# Patient Record
Sex: Male | Born: 1950
Health system: Southern US, Community
[De-identification: ages and names within clinical notes are randomized; demographics above are authoritative.]

## PROBLEM LIST (undated history)

## (undated) DIAGNOSIS — N400 Enlarged prostate without lower urinary tract symptoms: Secondary | ICD-10-CM

## (undated) DIAGNOSIS — K5732 Diverticulitis of large intestine without perforation or abscess without bleeding: Secondary | ICD-10-CM

## (undated) DIAGNOSIS — R001 Bradycardia, unspecified: Secondary | ICD-10-CM

## (undated) DIAGNOSIS — N200 Calculus of kidney: Secondary | ICD-10-CM

## (undated) DIAGNOSIS — R55 Syncope and collapse: Secondary | ICD-10-CM

## (undated) DIAGNOSIS — I10 Essential (primary) hypertension: Secondary | ICD-10-CM

## (undated) HISTORY — DX: Calculus of kidney: N20.0

## (undated) HISTORY — DX: Diverticulitis of large intestine without perforation or abscess without bleeding: K57.32

## (undated) HISTORY — DX: Essential (primary) hypertension: I10

## (undated) HISTORY — PX: TRANSURETHRAL RESECTION OF PROSTATE: SHX73

## (undated) HISTORY — PX: CYSTOSTOMY W/ BLADDER BIOPSY: SHX1431

## (undated) HISTORY — DX: Benign prostatic hyperplasia without lower urinary tract symptoms: N40.0

## (undated) HISTORY — DX: Bradycardia, unspecified: R00.1

## (undated) HISTORY — DX: Syncope and collapse: R55

---
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
## 1998-10-03 ENCOUNTER — Other Ambulatory Visit: Admission: RE | Admit: 1998-10-03 | Discharge: 1998-10-03 | Payer: Self-pay | Admitting: Urology

## 1999-03-07 ENCOUNTER — Emergency Department (HOSPITAL_COMMUNITY): Admission: EM | Admit: 1999-03-07 | Discharge: 1999-03-07 | Payer: Self-pay

## 1999-11-16 ENCOUNTER — Encounter (INDEPENDENT_AMBULATORY_CARE_PROVIDER_SITE_OTHER): Payer: Self-pay | Admitting: Specialist

## 1999-11-16 ENCOUNTER — Ambulatory Visit (HOSPITAL_COMMUNITY): Admission: RE | Admit: 1999-11-16 | Discharge: 1999-11-16 | Payer: Self-pay | Admitting: Urology

## 2005-05-04 ENCOUNTER — Ambulatory Visit: Payer: Self-pay | Admitting: Internal Medicine

## 2005-09-25 ENCOUNTER — Ambulatory Visit: Payer: Self-pay | Admitting: Internal Medicine

## 2006-01-21 ENCOUNTER — Ambulatory Visit: Payer: Self-pay | Admitting: Internal Medicine

## 2006-05-08 ENCOUNTER — Ambulatory Visit: Payer: Self-pay | Admitting: Internal Medicine

## 2006-05-08 LAB — CONVERTED CEMR LAB
Basophils Relative: 0.5 % (ref 0.0–1.0)
CO2: 34 meq/L — ABNORMAL HIGH (ref 19–32)
Calcium: 9.5 mg/dL (ref 8.4–10.5)
Eosinophils Relative: 2.1 % (ref 0.0–5.0)
GFR calc Af Amer: 89 mL/min
Glucose, Bld: 106 mg/dL — ABNORMAL HIGH (ref 70–99)
HCT: 48.1 % (ref 39.0–52.0)
Hemoglobin: 16.4 g/dL (ref 13.0–17.0)
Lymphocytes Relative: 19 % (ref 12.0–46.0)
Monocytes Absolute: 0.7 10*3/uL (ref 0.2–0.7)
Neutro Abs: 4.8 10*3/uL (ref 1.4–7.7)
Neutrophils Relative %: 68.3 % (ref 43.0–77.0)
Platelets: 335 10*3/uL (ref 150–400)
VLDL: 14 mg/dL (ref 0–40)
WBC: 6.9 10*3/uL (ref 4.5–10.5)

## 2006-05-30 ENCOUNTER — Ambulatory Visit: Payer: Self-pay | Admitting: Cardiology

## 2006-05-30 ENCOUNTER — Ambulatory Visit: Payer: Self-pay

## 2006-06-25 ENCOUNTER — Ambulatory Visit: Payer: Self-pay | Admitting: Internal Medicine

## 2007-06-13 ENCOUNTER — Telehealth (INDEPENDENT_AMBULATORY_CARE_PROVIDER_SITE_OTHER): Payer: Self-pay | Admitting: *Deleted

## 2007-07-24 ENCOUNTER — Ambulatory Visit: Payer: Self-pay | Admitting: Internal Medicine

## 2007-07-24 DIAGNOSIS — F988 Other specified behavioral and emotional disorders with onset usually occurring in childhood and adolescence: Secondary | ICD-10-CM | POA: Insufficient documentation

## 2007-07-24 DIAGNOSIS — K5790 Diverticulosis of intestine, part unspecified, without perforation or abscess without bleeding: Secondary | ICD-10-CM | POA: Insufficient documentation

## 2007-07-24 DIAGNOSIS — N400 Enlarged prostate without lower urinary tract symptoms: Secondary | ICD-10-CM | POA: Insufficient documentation

## 2007-07-24 DIAGNOSIS — I10 Essential (primary) hypertension: Secondary | ICD-10-CM | POA: Insufficient documentation

## 2007-07-25 LAB — CONVERTED CEMR LAB
AST: 20 units/L (ref 0–37)
Alkaline Phosphatase: 59 units/L (ref 39–117)
Bilirubin Urine: NEGATIVE
Bilirubin, Direct: 0.2 mg/dL (ref 0.0–0.3)
Chloride: 105 meq/L (ref 96–112)
Eosinophils Absolute: 0.1 10*3/uL (ref 0.0–0.7)
Eosinophils Relative: 2.2 % (ref 0.0–5.0)
GFR calc non Af Amer: 66 mL/min
HCT: 45 % (ref 39.0–52.0)
HDL: 46.8 mg/dL (ref >39.0)
Hemoglobin, Urine: NEGATIVE
Leukocytes, UA: NEGATIVE
MCV: 87.2 fL (ref 78.0–100.0)
Monocytes Absolute: 0.5 10*3/uL (ref 0.1–1.0)
Neutro Abs: 4 10*3/uL (ref 1.4–7.7)
Nitrite: NEGATIVE
Platelets: 331 10*3/uL (ref 150–400)
Potassium: 3.9 meq/L (ref 3.5–5.1)
RBC / HPF: NONE SEEN
RDW: 12.8 % (ref 11.5–14.6)
Total Bilirubin: 0.9 mg/dL (ref 0.3–1.2)
Triglycerides: 87 mg/dL (ref 0–149)
Urine Glucose: NEGATIVE mg/dL
WBC: 5.7 10*3/uL (ref 4.5–10.5)

## 2007-08-20 ENCOUNTER — Encounter: Payer: Self-pay | Admitting: Internal Medicine

## 2008-07-05 ENCOUNTER — Ambulatory Visit: Payer: Self-pay | Admitting: Internal Medicine

## 2008-07-05 ENCOUNTER — Inpatient Hospital Stay (HOSPITAL_COMMUNITY): Admission: EM | Admit: 2008-07-05 | Discharge: 2008-07-07 | Payer: Self-pay | Admitting: Emergency Medicine

## 2008-07-06 ENCOUNTER — Encounter: Payer: Self-pay | Admitting: Cardiology

## 2008-07-06 HISTORY — PX: US ECHOCARDIOGRAPHY: HXRAD669

## 2008-07-07 ENCOUNTER — Ambulatory Visit: Payer: Self-pay | Admitting: Internal Medicine

## 2008-07-21 DIAGNOSIS — I498 Other specified cardiac arrhythmias: Secondary | ICD-10-CM | POA: Insufficient documentation

## 2008-07-21 DIAGNOSIS — R55 Syncope and collapse: Secondary | ICD-10-CM | POA: Insufficient documentation

## 2008-07-23 ENCOUNTER — Ambulatory Visit: Payer: Self-pay | Admitting: Cardiology

## 2008-07-23 ENCOUNTER — Ambulatory Visit: Payer: Self-pay

## 2008-07-26 ENCOUNTER — Ambulatory Visit: Payer: Self-pay | Admitting: Internal Medicine

## 2008-11-01 ENCOUNTER — Telehealth (INDEPENDENT_AMBULATORY_CARE_PROVIDER_SITE_OTHER): Payer: Self-pay | Admitting: *Deleted

## 2008-11-10 ENCOUNTER — Ambulatory Visit: Payer: Self-pay | Admitting: Internal Medicine

## 2008-11-11 LAB — CONVERTED CEMR LAB
Alkaline Phosphatase: 65 units/L (ref 39–117)
Basophils Absolute: 0 10*3/uL (ref 0.0–0.1)
Basophils Relative: 0.4 % (ref 0.0–3.0)
Bilirubin, Direct: 0.2 mg/dL (ref 0.0–0.3)
CO2: 32 meq/L (ref 19–32)
Calcium: 9.7 mg/dL (ref 8.4–10.5)
Creatinine, Ser: 1.4 mg/dL (ref 0.4–1.5)
Eosinophils Absolute: 0.2 10*3/uL (ref 0.0–0.7)
HDL: 53.4 mg/dL (ref >39.00)
LDL Cholesterol: 116 mg/dL — ABNORMAL HIGH (ref 0–99)
Lymphocytes Relative: 23.7 % (ref 12.0–46.0)
MCHC: 34.4 g/dL (ref 30.0–36.0)
Neutrophils Relative %: 62.5 % (ref 43.0–77.0)
Nitrite: NEGATIVE
RBC: 5.29 M/uL (ref 4.22–5.81)
Total CHOL/HDL Ratio: 3
Total Protein, Urine: NEGATIVE mg/dL
Total Protein: 7.3 g/dL (ref 6.0–8.3)
Triglycerides: 71 mg/dL (ref 0.0–149.0)
VLDL: 14.2 mg/dL (ref 0.0–40.0)
WBC: 6.7 10*3/uL (ref 4.5–10.5)
pH: 5.5 (ref 5.0–8.0)

## 2008-12-01 ENCOUNTER — Ambulatory Visit: Payer: Self-pay | Admitting: Internal Medicine

## 2009-05-25 ENCOUNTER — Encounter: Payer: Self-pay | Admitting: Internal Medicine

## 2009-06-03 ENCOUNTER — Ambulatory Visit: Payer: Self-pay | Admitting: Internal Medicine

## 2009-06-03 DIAGNOSIS — J069 Acute upper respiratory infection, unspecified: Secondary | ICD-10-CM | POA: Insufficient documentation

## 2010-01-16 ENCOUNTER — Encounter: Payer: Self-pay | Admitting: Internal Medicine

## 2010-01-16 ENCOUNTER — Ambulatory Visit: Payer: Self-pay | Admitting: Internal Medicine

## 2010-01-26 ENCOUNTER — Encounter: Payer: Self-pay | Admitting: Internal Medicine

## 2010-01-26 ENCOUNTER — Telehealth (INDEPENDENT_AMBULATORY_CARE_PROVIDER_SITE_OTHER): Payer: Self-pay | Admitting: *Deleted

## 2010-02-06 ENCOUNTER — Telehealth: Payer: Self-pay | Admitting: Internal Medicine

## 2010-02-28 ENCOUNTER — Telehealth (INDEPENDENT_AMBULATORY_CARE_PROVIDER_SITE_OTHER): Payer: Self-pay | Admitting: *Deleted

## 2010-03-01 ENCOUNTER — Ambulatory Visit: Payer: Self-pay | Admitting: Internal Medicine

## 2010-03-02 ENCOUNTER — Encounter: Payer: Self-pay | Admitting: Physician Assistant

## 2010-03-02 ENCOUNTER — Encounter (INDEPENDENT_AMBULATORY_CARE_PROVIDER_SITE_OTHER): Payer: Self-pay | Admitting: Internal Medicine

## 2010-03-02 ENCOUNTER — Telehealth: Payer: Self-pay | Admitting: Internal Medicine

## 2010-03-02 ENCOUNTER — Observation Stay (HOSPITAL_COMMUNITY)
Admission: EM | Admit: 2010-03-02 | Discharge: 2010-03-03 | Payer: Self-pay | Source: Home / Self Care | Attending: Internal Medicine | Admitting: Internal Medicine

## 2010-03-03 ENCOUNTER — Telehealth (INDEPENDENT_AMBULATORY_CARE_PROVIDER_SITE_OTHER): Payer: Self-pay | Admitting: *Deleted

## 2010-03-03 ENCOUNTER — Encounter: Payer: Self-pay | Admitting: Physician Assistant

## 2010-03-07 ENCOUNTER — Telehealth: Payer: Self-pay | Admitting: Internal Medicine

## 2010-03-10 ENCOUNTER — Ambulatory Visit: Payer: Self-pay | Admitting: Internal Medicine

## 2010-03-17 ENCOUNTER — Ambulatory Visit: Payer: Self-pay | Admitting: Physician Assistant

## 2010-03-17 ENCOUNTER — Encounter: Payer: Self-pay | Admitting: Physician Assistant

## 2010-03-17 ENCOUNTER — Encounter: Payer: Self-pay | Admitting: Cardiology

## 2010-03-17 DIAGNOSIS — I4891 Unspecified atrial fibrillation: Secondary | ICD-10-CM | POA: Insufficient documentation

## 2010-03-28 ENCOUNTER — Telehealth: Payer: Self-pay | Admitting: Cardiology

## 2010-03-28 LAB — CONVERTED CEMR LAB: TSH: 0.588 microintl units/mL (ref 0.350–4.500)

## 2010-04-13 ENCOUNTER — Ambulatory Visit (HOSPITAL_COMMUNITY): Admission: RE | Admit: 2010-04-13 | Payer: Self-pay | Source: Home / Self Care | Admitting: Internal Medicine

## 2010-04-20 ENCOUNTER — Ambulatory Visit: Admission: RE | Admit: 2010-04-20 | Discharge: 2010-04-20 | Payer: Self-pay | Source: Home / Self Care

## 2010-04-20 ENCOUNTER — Ambulatory Visit
Admission: RE | Admit: 2010-04-20 | Discharge: 2010-04-20 | Payer: Self-pay | Source: Home / Self Care | Attending: Internal Medicine | Admitting: Internal Medicine

## 2010-04-20 ENCOUNTER — Ambulatory Visit (HOSPITAL_COMMUNITY)
Admission: RE | Admit: 2010-04-20 | Discharge: 2010-04-20 | Payer: Self-pay | Source: Home / Self Care | Attending: Cardiology | Admitting: Cardiology

## 2010-04-20 DIAGNOSIS — I498 Other specified cardiac arrhythmias: Secondary | ICD-10-CM

## 2010-04-21 ENCOUNTER — Encounter: Payer: Self-pay | Admitting: Physician Assistant

## 2010-04-25 NOTE — Progress Notes (Signed)
Summary: fainting spell pt at [facility], [place]  Phone Note Call from Patient   Caller: Patient [phone] Call For: Auden Tatar Summary of Call: pt is in [place]  had a fainting spell Initial call taken by: Lacinda Axon,  March 02, 2010 1:50 PM  Follow-up for Phone Call        pt states this morning when he got up he fainted and was taken to [facility] to be evaluated and they are keeping him over night for evaluation.  Pt states yesterday he took norvasc in the morning and took the cardura last night. He did not take flomax at all yesterday.  He thinks the fainting spell might be due to the new meds. Pt just wanted MW to be aware of what happened. Please advise. Carron Curie CMA  March 02, 2010 2:53 PM Aware, discussed with hospitalist, the dose of cardura will need to be adjusted until the effect of the norvasc has completely worn off.  Follow-up by: Nyoka Cowden MD,  March 03, 2010 8:00 AM

## 2010-04-25 NOTE — Progress Notes (Signed)
Summary: HFU w/ wert > 12.16.11 @ 1155  Phone Note Call from Patient   Caller: Patient Call For: wert Summary of Call: pt being d/c'd from hosp this morning. he wants to be see for a HFU w/ dr wert asap next wk. he requested today but since dr wert isn't in office today pt req asap next wk. pls advise. cell [phone] Initial call taken by: Tivis Ringer, CNA,  March 03, 2010 9:28 AM  Follow-up for Phone Call        called spoke with patient who is being dc'd from [facility] today.  would like post hosp f/u sometime next week.  hfu scheduled w/ MW 12.16.11 @ 1155.  pt okay with this date and time. Follow-up by: Boone Master CNA/MA,  March 03, 2010 10:00 AM

## 2010-04-25 NOTE — Assessment & Plan Note (Signed)
Summary: Primary svc/ f/u hbp bph > change to cardura   Primary Provider/Referring Provider:  Sherene Sires  CC:  Elavated BP x 2 days.  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    06/2008 One episode syncope eval by Dr Rosette Reveal  > reduce toprol by half and leave off dexadrin  November 10, 2008 cpx no aerobics but no limitations and no presyncope. no change in rx.  June 04, 2009 6 month followup.  Pt c/o head congestion x 3-4 days.  He also c/o dry cough.  no tia or claudication.   January 16, 2010 cpx no c/o tia, claudication/sob. rec stop maxzide unless blood pressure is up or swelling  March 01, 2010 ov f/u hbp somewhat  eratic with stress at office.  no problem with urinary flow on flomax. some leg swelling on norvasc so restarted maxzide as per instructions but bp still up over  100 diastolic esp p work.  no ha, cp, tia or claudication symptoms.  no sob. leg swelling better  Current Medications (verified): 1)  Tenex 1 Mg  Tabs (Guanfacine Hcl) .[name].. 1 By Mouth At Bedtime 2)  Toprol Xl 25 Mg Xr24h-Tab (Metoprolol Succinate) .... Take 1 Tablet By Mouth Once A Day 3)  Ecotrin 325 Mg  Tbec (Aspirin) .[name].. 1 By Mouth Once Daily 4)  Flomax 0.4 Mg  Cp24 (Tamsulosin Hcl) .[name].. 1 By Mouth At Bedtime 5)  Maxzide-25 37.5-25 Mg  Tabs (Triamterene-Hctz) .[name].. 1 By Mouth Once Daily If Sys Bp Over 130 or Dias Over 85 or Leg Swelling 6)  Finasteride 5 Mg  Tabs (Finasteride) .[name].. 1 By Mouth Once Daily 7)  Amlodipine Besylate 5 Mg  Tabs (Amlodipine Besylate) .... One Tablet By Mouth Daily 8)  Coricidin Hbp Cold/flu 2-325 Mg Tabs (Chlorpheniramine-Apap) .... As Directed 9)  Nyquil 60-7.08-22-998 Mg/22ml Liqd (Pseudoeph-Doxylamine-Dm-Apap) .... Per Bottle Directions As Needed  Allergies (verified): 1)  ! Amoxicillin  Past History:  Past Medical History: HYPERTENSION  (ICD-401.9) [name] SYNCOPE (ICD-780.2) DIVERTICULITIS OF COLON (ICD-562.11)..........................................................Marland KitchenLina Sar      - colonoscopy 07/11/2000 Health Maintenance...............................................................................................Marland KitchenWert     - DT November 10, 2008      - CPX January 16, 2010  NEPHROLITHIASIS/BPH......................................................................................[name] Dr. [name]     -  prostatitis with noncaseating granulomatous inflammation July 2000 negative IPPD    - Change flomax to cardura for bp March 01, 2010  ADULT ADD.........................................................................................................Marland KitchenDr  [name]  Vital Signs:  Patient profile:   60 year old male Weight:      192 pounds O2 Sat:      98 % on Room air Temp:     97.4 degrees F oral Pulse rate:   75 / minute BP sitting:   126 / 88  (right arm)  Vitals Entered By: Vernie Murders (March 01, 2010 10:34 AM)  O2 Flow:  Room air  Physical Exam  Additional Exam:  Ambulatory healthy appearing in no acute distress.  wt   207 November 10, 2008 > 205 June 03, 2009 >  187  January 16, 2010 > 192 March 01, 2010  HEENT: nl dentition, turbinates, and orophanx. Nl external ear canals without cough reflex Neck without JVD/Nodes/TM normal carotids without bruits Lungs clear to A and P bilaterally without cough on insp or exp maneuvers RRR no s3 or murmur or increase in P2 no  displacement PMI Abd soft and benign with nl excursion in the supine position. No bruits or organomegaly Ext warm without calf tenderness, cyanosis clubbing or edema symmetric pedal pulses      Impression & Recommendations:  Problem # 1:  HYPERTENSION (ICD-401.9)  The following medications were removed from the medication list:    Amlodipine Besylate 5 Mg Tabs (Amlodipine besylate) ..... One tablet by mouth daily His  updated medication list for this problem includes:    Tenex 1 Mg Tabs (Guanfacine hcl) .[name]... 1 by mouth at bedtime    Toprol Xl 25 Mg Xr24h-tab (Metoprolol succinate) .[name]... Take 1 tablet by mouth once a day    Maxzide-25 37.5-25 Mg Tabs (Triamterene-hctz) .[name]... 1 by mouth once daily if sys bp over 130 or dias over 85 or leg swelling    Cardura 8 Mg Tabs (Doxazosin mesylate) ..... One twice daily  Orders: Est. Patient Level IV (04540)  Problem # 2:  BENIGN PROSTATIC HYPERTROPHY, HX OF (ICD-V13.8)  Cardura in high doses should be able to replace the flomax and the norvasc (which tends to cause swelling and not helping urinary flow like cardura will)   Orders: Est. Patient Level IV (98119)  Medications Added to Medication List This Visit: 1)  Cardura 8 Mg Tabs (Doxazosin mesylate) .... One twice daily  Patient Instructions: 1)  Try off flomax and norvasc 2)  Cardura 8 mg one twice daily and take one half twice daily if too strong 3)  Add a second dose Toprol 25 mg in evening if blood pressure too high  4)  Return to office in 3 months, sooner if needed  Prescriptions: CARDURA 8 MG TABS (DOXAZOSIN MESYLATE) one twice daily  #60 x 11   Entered and Authorized by:   Nyoka Cowden MD   Signed by:   Nyoka Cowden MD on 03/01/2010   Method used:   Electronically to        [facility]. [phone]* (retail)       [address]       [place], [place]  [address]       Ph: [phone]       Fax: [phone]   RxID:   [id number]

## 2010-04-25 NOTE — Letter (Signed)
Summary: [facility]  [address]   [place], [place] [address]   Phone: [phone]  Fax: [phone]    01/26/2010  [name] [address] [place], [place]  [address]  To whom it may concern,    The above named patient had an appointment here on 01/16/10 for his annual comprehensive healthcare evaluation which included a routine chest x-ray, ECG, and bloodwork.          Sincerely,     Sandrea Hughs, MD

## 2010-04-25 NOTE — Assessment & Plan Note (Signed)
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    06/2008 One episode syncope eval by Dr Rosette Reveal  > reduce toprol by half and leave off dexadrin  November 10, 2008 cpx no aerobics but no limitations and no presyncope. no change in rx.  June 04, 2009 6 month followup.  Pt c/o head congestion x 3-4 days.  He also c/o dry cough.  no tia or claudication.   January 16, 2010 cpx no c/o tia, claudication/sob. Pt denies any significant sore throat, dysphagia, itching, sneezing,  nasal congestion or excess secretions,  fever, chills, sweats, unintended wt loss, pleuritic or exertional cp, hempoptysis, change in activity tolerance  orthopnea pnd or leg swelling   Current Medications (verified): 1)  Tenex 1 Mg  Tabs (Guanfacine Hcl) .[name].. 1 By Mouth At Bedtime 2)  Toprol Xl 25 Mg Xr24h-Tab (Metoprolol Succinate) .... Take 1 Tablet By Mouth Once A Day 3)  Ecotrin 325 Mg  Tbec (Aspirin) .[name].. 1 By Mouth Once Daily 4)  Flomax 0.4 Mg  Cp24 (Tamsulosin Hcl) .[name].. 1 By Mouth At Bedtime 5)  Maxzide-25 37.5-25 Mg  Tabs (Triamterene-Hctz) .[name].. 1 By Mouth Once Daily 6)  Finasteride 5 Mg  Tabs (Finasteride) .[name].. 1 By Mouth Once Daily 7)  Amlodipine Besylate 5 Mg  Tabs (Amlodipine Besylate) .... One Tablet By Mouth Daily 8)  Coricidin Hbp Cold/flu 2-325 Mg Tabs (Chlorpheniramine-Apap) .... As Directed 9)  Nyquil 60-7.08-22-998 Mg/10ml Liqd (Pseudoeph-Doxylamine-Dm-Apap) .... Per Bottle Directions As Needed  Allergies (verified): 1)  ! Amoxicillin  Past History:  Past Medical History: HYPERTENSION (ICD-401.9) [name] SYNCOPE (ICD-780.2) DIVERTICULITIS OF COLON (ICD-562.11)..........................................................Marland KitchenLina Sar      - colonoscopy 07/11/2000 Health  Maintenance...............................................................................................Marland KitchenWert     - DT November 10, 2008      - CPX January 16, 2010  NEPHROLITHIASIS/BPH......................................................................................[name] Dr. [name]     -  prostatitis with noncaseating granulomatous inflammation July 2000 negative IPPD ADULT ADD.........................................................................................................Marland KitchenDr  [name]  Family History: HBP/ kidney failure father IHD  with cabg at 15 mother one younger sister healthy no problems  Social History:  The patient lives at home with his wife.  He is a   Clinical research associate.  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  He drinks 3   glasses of wine per  week.   never smoker  Vital Signs:  Patient profile:   60 year old male Weight:      187.38 pounds BMI:     22.89 O2 Sat:      98 % on Room air Temp:     97.5 degrees F oral Pulse rate:   72 / minute BP sitting:   108 / 80  (left arm)  Vitals Entered By: Vernie Murders (January 16, 2010 8:43 AM)  O2 Flow:  Room air  Physical Exam  Additional Exam:  Ambulatory healthy appearing in no acute distress.  wt 208 > 207 November 10, 2008 > 205 June 03, 2009 >  187  January 16, 2010  HEENT: nl dentition, turbinates, and orophanx. Nl external ear canals without cough reflex Neck without JVD/Nodes/TM normal carotids without bruits Lungs clear to A and P bilaterally without cough on insp or exp maneuvers RRR no s3 or  murmur or increase in P2 no displacement PMI Abd soft and benign with nl excursion in the supine position. No bruits or organomegaly Ext warm without calf tenderness, cyanosis clubbing or edema symmetric pedal pulses Skin warm and dry without lesions  except for slight asymmetric freckles lower trunk    CXR  Procedure date:  01/16/2010  Findings:        Comparison:  Portable chest x-ray of 07/06/2008   Findings: The lungs are clear.  A tiny granuloma in the right mid lung appears stable.  Mediastinal contours are stable.  The heart is within normal limits in size.  No bony abnormality is seen.   IMPRESSION: Stable chest x-ray.  No active lung disease.  Impression & Recommendations:  Problem # 1:  HYPERTENSION (ICD-401.9)  His updated medication list for this problem includes:    Tenex 1 Mg Tabs (Guanfacine hcl) .[name]... 1 by mouth at bedtime    Toprol Xl 25 Mg Xr24h-tab (Metoprolol succinate) .[name]... Take 1 tablet by mouth once a day    Maxzide-25 37.5-25 Mg Tabs (Triamterene-hctz) .[name]... 1 by mouth once daily if sys bp over 130 or dias over 85 or leg swelling    Amlodipine Besylate 5 Mg Tabs (Amlodipine besylate) ..... One tablet by mouth daily  Problem # 2:  [name]  His updated medication list for this problem includes:    Toprol Xl 25 Mg Xr24h-tab (Metoprolol succinate) .[name]... Take 1 tablet by mouth once a day    Ecotrin 325 Mg Tbec (Aspirin) .[name]... 1 by mouth once daily  tolerating low dose toprol  Problem # 3:  BENIGN PROSTATIC HYPERTROPHY, HX OF (ICD-V13.8) f/u per urology planned  Medications Added to Medication List This Visit: 1)  Maxzide-25 37.5-25 Mg Tabs (Triamterene-hctz) .[name].. 1 by mouth once daily if sys bp over 130 or dias over 85 or leg swelling  Other Orders: EKG w/ Interpretation (93000) TLB-Lipid Panel (80061-LIPID) TLB-BMP (Basic Metabolic Panel-BMET) (80048-METABOL) TLB-CBC Platelet - w/Differential (85025-CBCD) TLB-Hepatic/Liver Function Pnl (80076-HEPATIC) TLB-TSH (Thyroid Stimulating Hormone) (84443-TSH) TLB-CRP-High Sensitivity (C-Reactive Protein) (86140-FCRP) TLB-Udip ONLY (81003-UDIP) T-2 View CXR (71020TC) Est. Patient 40-64 years (40981)  Patient Instructions: 1)  Call [phone] for your results w/in next 3 days - if there's something important  I feel you need to know,  I'll be in touch with  you directly.  2)  stop maxzide unless blood pressure is up or swelling

## 2010-04-25 NOTE — Progress Notes (Signed)
Summary: Labs reviewed from [facility]  Phone Note Call from Patient   Caller: Patient Call For: wert Summary of Call: pt states he has not received letter mailed on 11/3 (for his ins/ re: recent physical). pls fax this to pt at [phone]. pt ph# is [phone] Initial call taken by: Tivis Ringer, CNA,  February 06, 2010 11:26 AM  Follow-up for Phone Call        Spoke with pt. He is requesting letter for insurance co to be faxed to him at [phone].  States he never received the one that was mailed.  Letter faxed -- pt aware.    Pt also states he had some lab work done at [facility] after his OV  on 01/16/10. Requesting results.  No record of these results in EMR yet -- Dr. Sherene Sires, do you recall seeing these results? Follow-up by: Gweneth Dimitri RN,  February 06, 2010 12:20 PM  Additional Follow-up for Phone Call Additional follow up Details #1::        I have not seen them - check with [facility] Additional Follow-up by: Nyoka Cowden MD,  February 06, 2010 12:45 PM    Additional Follow-up for Phone Call Additional follow up Details #2::    Need to know which [facility] we went to? There are several. LMOMTCB Vernie Murders  February 06, 2010 4:05 PM   Spoke with pt.  He states that he went to have labs at the [facility] on [address] in [place]- there number is [phone].  I called and msg states that they are closed for lunch so will try calling back this pm. Vernie Murders  February 07, 2010 12:15 PM   Additional Follow-up for Phone Call Additional follow up Details #3:: Details for Additional Follow-up Action Taken: Called and spoke with June at [facility] and am awaiting results to be sent to the triage faxline-  Vernie Murders  February 07, 2010 2:06 PM  labs placed in MW lookat Vernie Murders  February 07, 2010 4:30 PM Reviewed and all are wnl, no changes needed.  LDL chol is 98 vs 116 a year ago.   Additional Follow-up by: Nyoka Cowden MD,  February 08, 2010 8:46 AM  LMTCBx1 to advised Labs  normal.Jennifer Yancey Flemings CMA  February 08, 2010 10:33 AM  pt aware of lab results. Carron Curie CMA  February 08, 2010 11:39 AM

## 2010-04-25 NOTE — Progress Notes (Signed)
Summary: High Blood Pressure   Phone Note Call from Patient Call back at [phone] or [phone](cell)   Caller: Patient Call For: wert Summary of Call: Pt called c/o high blood pressure readings 145/100 today(checked both arms). States this is unusal for him.  Has not skipped any doses on meds. Please advise. Initial call taken by: Reynaldo Minium CMA,  February 28, 2010 2:36 PM  Follow-up for Phone Call        pt requested ov with mw--pt scheduled to see mw wed. at 10:20 Follow-up by: Philipp Deputy CMA,  February 28, 2010 3:43 PM

## 2010-04-25 NOTE — Progress Notes (Signed)
Summary: note  Phone Note Call from Patient Call back at [phone](w) [phone](c)   Caller: Patient Call For: wert Summary of Call: Pt states he needs a not for his insurance co stating he had a cpx from MW. Initial call taken by: Darletta Moll,  January 26, 2010 4:18 PM  Follow-up for Phone Call        Spoke with pt, created letter and mailed to him per his request. Follow-up by: Vernie Murders,  January 26, 2010 4:29 PM

## 2010-04-25 NOTE — Letter (Signed)
Summary: [facility]  [facility]   Imported By: Lester [place] 06/02/2009 11:58:52  _____________________________________________________________________  External Attachment:    Type:   Image     Comment:   External Document

## 2010-04-25 NOTE — Assessment & Plan Note (Signed)
Summary: Primary svc/ f/u hbp   Primary Provider/Referring Provider:  Sherene Sires  CC:  6 month followup.  Pt c/o head congestion x 3-4 days.  He also c/o dry cough.  .  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    06/2008 One episode syncope eval by Dr Rosette Reveal  > reduce toprol by half and leave off dexadrin  November 10, 2008 cpx no aerobics but no limitations and no presyncope. no change in rx.  June 04, 2009 6 month followup.  Pt c/o head congestion x 3-4 days.  He also c/o dry cough.  no tia or claudication.  Pt denies any significant sore throat, dysphagia, itching, sneezing,  nasal congestion or excess secretions,  fever, chills, sweats, unintended wt loss, pleuritic or exertional cp, hempoptysis, change in activity tolerance  orthopnea pnd or leg swelling   Current Medications (verified): 1)  Tenex 1 Mg  Tabs (Guanfacine Hcl) .[name].. 1 By Mouth At Bedtime 2)  Toprol Xl 25 Mg Xr24h-Tab (Metoprolol Succinate) .... Take 1 Tablet By Mouth Once A Day 3)  Ecotrin 325 Mg  Tbec (Aspirin) .[name].. 1 By Mouth Once Daily 4)  Flomax 0.4 Mg  Cp24 (Tamsulosin Hcl) .[name].. 1 By Mouth At Bedtime 5)  Maxzide-25 37.5-25 Mg  Tabs (Triamterene-Hctz) .[name].. 1 By Mouth Once Daily 6)  Finasteride 5 Mg  Tabs (Finasteride) .[name].. 1 By Mouth Once Daily 7)  Amlodipine Besylate 5 Mg  Tabs (Amlodipine Besylate) .... One Tablet By Mouth Daily 8)  Coricidin Hbp Cold/flu 2-325 Mg Tabs (Chlorpheniramine-Apap) .... As Directed 9)  Nyquil 60-7.08-22-998 Mg/59ml Liqd (Pseudoeph-Doxylamine-Dm-Apap) .... Per Bottle Directions As Needed  Allergies (verified): 1)  ! Amoxicillin  Past History:  Past Medical History: HYPERTENSION (ICD-401.9) [name] SYNCOPE (ICD-780.2) DIVERTICULITIS OF COLON (ICD-562.11)..........................................................Marland KitchenLina Sar      - colonoscopy  07/11/2000 ATTENTION DEFICIT DISORDER, ADULT (ICD-314.00) Health Maintenance...............................................................................................Marland KitchenWert     - DT November 10, 2008      - CPX November 10, 2008  NEPHROLITHIASIS/BPH......................................................................................[name] Dr. [name]     -  prostatitis with noncaseating granulomatous inflammation July 2000 negative IPPD ADULT ADD.........................................................................................................Marland KitchenDr  [name]  Vital Signs:  Patient profile:   60 year old male Weight:      205 pounds O2 Sat:      97 % on Room air Temp:     97.3 degrees F oral Pulse rate:   82 / minute BP sitting:   114 / 82  (left arm)  Vitals Entered By: Vernie Murders (June 03, 2009 9:32 AM)  O2 Flow:  Room air  Physical Exam  Additional Exam:  Ambulatory healthy appearing in no acute distress. Afeb with normal vital signs  wt 208 > 207 November 10, 2008 > 205 June 03, 2009  HEENT: nl dentition, turbinates, and orophanx. Nl external ear canals without cough reflex Neck without JVD/Nodes/TM normal carotids without bruits Lungs clear to A and P bilaterally without cough on insp or exp maneuvers RRR no s3 or murmur or increase in P2 no displacement PMI Abd soft and benign with nl excursion in the supine position. No bruits or organomegaly Ext warm without calf tenderness, cyanosis clubbing or edema symmetric pedal pulses Skin warm and dry without lesions  except for slight asymmetric freckles lower trunk    Impression & Recommendations:  Problem # 1:  HYPERTENSION (ICD-401.9)  His updated medication list for  this problem includes:    Tenex 1 Mg Tabs (Guanfacine hcl) .[name]... 1 by mouth at bedtime    Toprol Xl 25 Mg Xr24h-tab (Metoprolol succinate) .[name]... Take 1 tablet by mouth once a day    Maxzide-25 37.5-25 Mg Tabs (Triamterene-hctz) .[name]... 1 by mouth once daily     Amlodipine Besylate 5 Mg Tabs (Amlodipine besylate) ..... One tablet by mouth daily  Orders: Est. Patient Level III (81191)  Problem # 2:  URI, ACUTE (ICD-465.9)  no evidence of secondary infection, rx reviewed  His updated medication list for this problem includes:    Ecotrin 325 Mg Tbec (Aspirin) .[name]... 1 by mouth once daily    Coricidin Hbp Cold/flu 2-325 Mg Tabs (Chlorpheniramine-apap) .[name]... As directed    Nyquil 60-7.08-22-998 Mg/66ml Liqd (Pseudoeph-doxylamine-dm-apap) .[name]... Per bottle directions as needed  Orders: Est. Patient Level III (47829)  Medications Added to Medication List This Visit: 1)  Coricidin Hbp Cold/flu 2-325 Mg Tabs (Chlorpheniramine-apap) .... As directed 2)  Nyquil 60-7.08-22-998 Mg/41ml Liqd (Pseudoeph-doxylamine-dm-apap) .... Per bottle directions as needed  Patient Instructions: 1)  Keep up the wt loss 2)  Avoid ephidrine drugs  3)  mucinex or mucinex dm will work fo cough and congestion - avoid mucinex d due to the ephidine  4)  Nasal saline for nasal congestion 5)  for itching sneezing clariton is best choice 6)  CPX 10/2009

## 2010-04-27 NOTE — Progress Notes (Signed)
Summary: returning call  Phone Note Call from Patient Call back at [phone]   Reason for Call: Talk to Doctor Summary of Call: Returning call to Dr. Sherene Sires. Initial call taken by: Lehman Prom,  March 07, 2010 3:54 PM  Follow-up for Phone Call        will resume previous rx with option to use an extra 25 mg of toprol if bp high in pm and for now stop the cardura completely Follow-up by: Nyoka Cowden MD,  March 07, 2010 5:15 PM

## 2010-04-27 NOTE — Miscellaneous (Signed)
  Clinical Lists Changes  Observations: Added new observation of PAST MED HX: HYPERTENSION (ICD-401.9) [name] SYNCOPE (ICD-780.2) Echo 03/2010: ef 55-65%; Grade 2 diast. dysfxn; mild MR; mild to mod LAE; mild RAE; PASP 31 DIVERTICULITIS OF COLON (ICD-562.11)..........................................................Marland KitchenLina Sar      - colonoscopy 07/11/2000 Health Maintenance...............................................................................................Marland KitchenWert     - DT November 10, 2008      - CPX January 16, 2010  NEPHROLITHIASIS/BPH......................................................................................[name] Dr. [name]     -  prostatitis with noncaseating granulomatous inflammation July 2000 negative IPPD    - Change flomax to cardura for bp March 01, 2010  ADULT ADD.........................................................................................................Marland KitchenDr  [name]  (04/21/2010 13:32)       Past History:  Past Medical History: HYPERTENSION (ICD-401.9) [name] SYNCOPE (ICD-780.2) Echo 03/2010: ef 55-65%; Grade 2 diast. dysfxn; mild MR; mild to mod LAE; mild RAE; PASP 31 DIVERTICULITIS OF COLON (ICD-562.11)..........................................................Marland KitchenLina Sar      - colonoscopy 07/11/2000 Health Maintenance...............................................................................................Marland KitchenWert     - DT November 10, 2008      - CPX January 16, 2010  NEPHROLITHIASIS/BPH......................................................................................[name] Dr. [name]     -  prostatitis with noncaseating granulomatous inflammation July 2000 negative IPPD    - Change flomax to cardura for bp March 01, 2010  ADULT ADD.........................................................................................................Marland KitchenDr  [name]

## 2010-04-27 NOTE — Progress Notes (Signed)
Summary: event monitor   Phone Note Outgoing Call   Call placed by: Marcos Eke,  March 28, 2010 11:34 AM Summary of Call: Call patient and left messege for him to call back to make appt. for monitor.  Follow-up for Phone Call        pt spouse call to get lab results and to talk to ruth about monitor. patinet is out of town and will not rtn until tomorrow night but please call his wife at [phone] Omer Jack  March 28, 2010 4:04 PM   I spoke with the pt's wife and the pt is currently changing jobs and does not know his schedule.  The pt is aware he needs monitor and appt with Dr Ladona Ridgel.  The pt's wife said he is currently in classes the next two days and she will have him contact the office to schedule appointments later this week. Julieta Gutting, RN, BSN  March 28, 2010 4:09 PM  Additional Follow-up for Phone Call Additional follow up Details #1::        patient has appt. for monitor. Additional Follow-up by: Marcos Eke,  April 13, 2010 8:39 AM    Additional Follow-up for Phone Call Additional follow up Details #2::    Pt scheduled for monitor placement on 04/14/10.  I will forward this message to Lela to also contact pt about appt needed with Dr Ladona Ridgel from 03/17/10 appt with Tereso Newcomer PA. Julieta Gutting, RN, BSN  April 13, 2010 9:38 AM

## 2010-04-27 NOTE — Assessment & Plan Note (Addendum)
Summary: eph/Syncope/mj  Medications Added AMLODIPINE BESYLATE 5 MG TABS (AMLODIPINE BESYLATE) 1 tab once daily FLOMAX 0.4 MG CAPS (TAMSULOSIN HCL) 1 cap once daily        Visit Type:  eph Primary Adriano Bischof:  Sandrea Hughs  CC:  pt had syncope episode @ home 3 wks ago says he thinks this was due to BP medications...denies any other complaints today.  History of Present Illness: Primary Electrophysiologist:  Dr. Lewayne Bunting  [name] is a 60 year old male with a history of syncope.  He was evaluated by Dr. Ladona Ridgel in April 2010.  At that time an exercise test was negative.  A head up tilt table test was also negative.  His event monitor demonstrated no arrhythmias.  His ejection fraction was 55-65% by echocardiogram.  A period of watchful waiting was pursued.    He recently was switched to Cardura for high blood pressure.  The morning after taking his first dose he had a syncopal episode and was admitted to [facility].  He remained there for one day.  His troponins were negative.  A d-dimer was negative.  Carotid Dopplers demonstrated plaque but no significant ICA stenosis.  He is brought back for followup today.  Of note in reviewing his hospital chart there was an electrocardiogram dated 03/02/00 that demonstrated atrial fibrillation with heart rate of 126. He returns for followup.  His syncope occurred in the context of taking Cardura for the first time.  He remembers having a prodrome prior to his syncopal episode.  There was a very brief period of confusion after his syncope.  He did strike his head.  A head CT was negative in the hospital.  He has not had any further episodes of syncope or near syncope.  He denies chest pain, shortness of breath.  He denies orthopnea, PND or edema.  He has some palpitations that he describes as a skipping sensation.  He denies any tachycardia palpitations.  He had a lot of PVCs on the monitor in the hospital that improved after restarting his  beta blocker.  Current Medications (verified): 1)  Tenex 1 Mg  Tabs (Guanfacine Hcl) .[name].. 1 By Mouth At Bedtime 2)  Toprol Xl 25 Mg Xr24h-Tab (Metoprolol Succinate) .... Take 1 Tablet By Mouth Once A Day 3)  Ecotrin 325 Mg  Tbec (Aspirin) .[name].. 1 By Mouth Once Daily 4)  Finasteride 5 Mg  Tabs (Finasteride) .[name].. 1 By Mouth Once Daily 5)  Coricidin Hbp Cold/flu 2-325 Mg Tabs (Chlorpheniramine-Apap) .... As Directed 6)  Nyquil 60-7.08-22-998 Mg/60ml Liqd (Pseudoeph-Doxylamine-Dm-Apap) .... Per Bottle Directions As Needed 7)  Amlodipine Besylate 5 Mg Tabs (Amlodipine Besylate) .[name].. 1 Tab Once Daily 8)  Flomax 0.4 Mg Caps (Tamsulosin Hcl) .[name].. 1 Cap Once Daily  Allergies: 1)  ! Amoxicillin  Past History:  Past Medical History: Last updated: 03/01/2010 HYPERTENSION (ICD-401.9) [name] SYNCOPE (ICD-780.2) DIVERTICULITIS OF COLON (ICD-562.11)..........................................................Marland KitchenLina Sar      - colonoscopy 07/11/2000 Health Maintenance...............................................................................................Marland KitchenWert     - DT November 10, 2008      - CPX January 16, 2010  NEPHROLITHIASIS/BPH......................................................................................[name] Dr. [name]     -  prostatitis with noncaseating granulomatous inflammation July 2000 negative IPPD    - Change flomax to cardura for bp March 01, 2010  ADULT ADD.........................................................................................................Marland KitchenDr  [name]  Social History: Reviewed history from 01/16/2010 and no changes required.  The patient lives at home with his wife.  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  He drinks 3   glasses of wine per  week.   never smoker  Vital Signs:  Patient profile:   60 year old male Height:      76 inches Weight:      197  pounds BMI:     24.07 Pulse rate:   71 / minute Pulse (ortho):   74 / minute Pulse rhythm:   regular BP sitting:   138 / 82  (left arm) BP standing:   141 / 89 Cuff size:   regular  Vitals Entered By: Danielle Rankin, CMA (March 17, 2010 11:24 AM)  Serial Vital Signs/Assessments:  Time      Position  BP       Pulse  Resp  Temp     By 11:44 AM  Lying LA  134/86   71                    Danielle Rankin, CMA 11:45 AM  Sitting   135/86   73                    Danielle Rankin, CMA 11:47 AM  Standing  141/89   74                    Danielle Rankin, CMA 11:49 AM  Standing  140/89   72                    Danielle Rankin, CMA 11:51 AM  Standing  142/92   73                    Danielle Rankin, CMA  Comments: 11:44 AM no sxms By: Danielle Rankin, CMA  11:45 AM no sxms By: Danielle Rankin, CMA  11:47 AM no sxms By: Danielle Rankin, CMA  11:49 AM no sxms By: Danielle Rankin, CMA  11:51 AM no sxms By: Danielle Rankin, CMA    Physical Exam  General:  Well nourished, well developed, in no acute distress HEENT: normal Neck: no JVD Cardiac:  normal S1, S2; RRR; no murmur Lungs:  clear to auscultation bilaterally, no wheezing, rhonchi or rales Abd: soft, nontender, no hepatomegaly Ext: no  edema Vascular: no carotid  bruits Skin: warm and dry Neuro:  CNs 2-12 intact, no focal abnormalities noted    EKG  Procedure date:  03/17/2010  Findings:      Normal Sinus Rhythm Heart rate 71 Normal axis J-point elevation No ischemic changes  Impression & Recommendations:  Problem # 1:  SYNCOPE (ICD-780.2) I suspect this is all related to his recent blood pressure medication change.  His orthostatic vital signs are negative today.  His EKG today is normal.  He has not had a recurrence of symptoms.  We discussed the recommendation of no driving for now.  He did have atrial fibrillation documented at least once in the hospital.  Therefore, I recommend that we repeat his 21 day monitor to assess for recurrent atrial  fibrillation.  I do not think this is the reason why he passed out.  Since he did develop atrial fibrillation, I will also set him up for a relook echocardiogram.   He had a negative ischemic workup last year.  I do not think he needs another ischemic evaluation at this time.  If his ejection fraction is changed, he will need further workup.  He is not having chest pain or sob.  I will bring him back in followup with Dr. Ladona Ridgel.  Orders: T-TSH [id number]) Echocardiogram (Echo)  Problem # 2:  PAROXYSMAL ATRIAL FIBRILLATION (ICD-427.31) This is documented on one EKG in the hospital.  He denies any symptoms that sound consistent with recurrent atrial fibrillation.  CHADS2=1.  He can continue ASA.  As above, an echocardiogram will be obtained.  Also he will have an event monitor to assess for recurrent atrial fibrillation.  As noted above, he will follow up with Dr. Ladona Ridgel.  I will check a TSH.  He did have a lot of PVCs in the hospital.  He can continue his current dose of beta blocker.  He had his beta blocker reduced last year after his first episode of syncope.  Other Orders: Event (Event)  Patient Instructions: 1)  Your physician recommends that you return for lab work in: Today for TSH (Dx 427.31). 2)  Your physician has recommended that you wear an 21 day event monitor.  Event monitors are medical devices that record the heart's electrical activity. Doctors most often use these monitors to diagnose arrhythmias. Arrhythmias are problems with the speed or rhythm of the heartbeat. The monitor is a small, portable device. You can wear one while you do your normal daily activities. This is usually used to diagnose what is causing palpitations/syncope (passing out). AS PER YOUR REQUEST YOU MAY SCHEDULE THIS AFTER 03/2010 3)  Your physician has requested that you have an echocardiogram.  Echocardiography is a painless test that uses sound waves to create images of your heart. It provides your doctor with  information about the size and shape of your heart and how well your heart's chambers and valves are working.  This procedure takes approximately one hour. There are no restrictions for this procedure. AS PER YOUR REQUEST YOU MAY SCHEDULE THIS AFTER 03/2010 4)  Your physician recommends that you schedule a follow-up appointment in: 5 WEEKS WITH DR. Ladona Ridgel 5)  Your physician recommends that you continue on your current medications as directed. Please refer to the Current Medication list given to you today.   Appended Document: eph/Syncope/mj HPI should state EKG done 03/02/2010 demonstrated AFib.

## 2010-05-24 ENCOUNTER — Encounter: Payer: Self-pay | Admitting: Internal Medicine

## 2010-05-24 ENCOUNTER — Ambulatory Visit (INDEPENDENT_AMBULATORY_CARE_PROVIDER_SITE_OTHER): Payer: BC Managed Care – PPO | Admitting: Internal Medicine

## 2010-05-24 DIAGNOSIS — I493 Ventricular premature depolarization: Secondary | ICD-10-CM | POA: Insufficient documentation

## 2010-05-24 DIAGNOSIS — R55 Syncope and collapse: Secondary | ICD-10-CM

## 2010-05-24 DIAGNOSIS — I4949 Other premature depolarization: Secondary | ICD-10-CM

## 2010-06-01 NOTE — Assessment & Plan Note (Signed)
Summary: follow up appt after monitor/mt    Visit Type:  Follow-up Primary Provider:  Sandrea Hughs   History of Present Illness: [name] returns today for followup. He is a pleasant 60 yo man with a h/o HTN and syncope. His initial workup was negative but it was thought that the most likely explanation was a neurally mediated mechanism. The has chronic prostate problems and was switched to cardura for both HTN and his prostate. The day after taking his initial dose, he passed out and was observed overnight in the hospital. He had no arrhythmias noted. He subsequently had a repeat 2D echo which only demonstrated LVH and diastolic dysfunction and a life watch monitor demonstrated frequent PVC's in a bigeminal fashion. He has had minimal palpitations but does note that his heart rate and pulse are at times slow. No recurrent syncope since his cardura was stopped. He denies c/p, sob, or peripheral edema.  Current Medications (verified): 1)  Tenex 1 Mg  Tabs (Guanfacine Hcl) .[name].. 1 By Mouth At Bedtime 2)  Toprol Xl 25 Mg Xr24h-Tab (Metoprolol Succinate) .... Take 1 Tablet By Mouth Once A Day 3)  Ecotrin 325 Mg  Tbec (Aspirin) .[name].. 1 By Mouth Once Daily 4)  Finasteride 5 Mg  Tabs (Finasteride) .[name].. 1 By Mouth Once Daily 5)  Coricidin Hbp Cold/flu 2-325 Mg Tabs (Chlorpheniramine-Apap) .... As Directed 6)  Nyquil 60-7.08-22-998 Mg/53ml Liqd (Pseudoeph-Doxylamine-Dm-Apap) .... Per Bottle Directions As Needed 7)  Amlodipine Besylate 5 Mg Tabs (Amlodipine Besylate) .[name].. 1 Tab Once Daily 8)  Flomax 0.4 Mg Caps (Tamsulosin Hcl) .[name].. 1 Cap Once Daily  Allergies: 1)  ! Amoxicillin  Past History:  Past Medical History: Last updated: 04/21/2010 HYPERTENSION (ICD-401.9) [name] SYNCOPE (ICD-780.2) Echo 03/2010: ef 55-65%; Grade 2 diast. dysfxn; mild MR; mild to mod LAE; mild RAE; PASP 31 DIVERTICULITIS OF COLON  (ICD-562.11)..........................................................Marland KitchenLina Sar      - colonoscopy 07/11/2000 Health Maintenance...............................................................................................Marland KitchenWert     - DT November 10, 2008      - CPX January 16, 2010  NEPHROLITHIASIS/BPH......................................................................................[name] Dr. [name]     -  prostatitis with noncaseating granulomatous inflammation July 2000 negative IPPD    - Change flomax to cardura for bp March 01, 2010  ADULT ADD.........................................................................................................Marland KitchenDr  [name]  Past Surgical History: Last updated: 07/21/2008  Bladder biopsy from trigone.  Family History: Last updated: 01/16/2010 HBP/ kidney failure father IHD  with cabg at 73 mother one younger sister healthy no problems  Social History: Last updated: 01/16/2010  The patient lives at home with his wife.  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  He drinks 3   glasses of wine per  week.   never smoker  Review of Systems  The patient denies chest pain, syncope, dyspnea on exertion, and peripheral edema.    Vital Signs:  Patient profile:   60 year old male Height:      76 inches Weight:      196 pounds BMI:     23.94 Pulse rate:   56 / minute BP sitting:   118 / 62  (left arm)  Vitals Entered By: Laurance Flatten CMA (May 24, 2010 8:37 AM)  Physical Exam  General:  Well nourished, well developed, in no acute distress HEENT: normal Neck: no JVD Cardiac:  normal S1, S2; RRR; no murmur Lungs:  clear to auscultation bilaterally, no wheezing, rhonchi or  rales Abd: soft, nontender, no hepatomegaly Ext: no  edema Vascular: no carotid  bruits Skin: warm and dry Neuro:  CNs 2-12 intact, no focal abnormalities noted    Event Monitor  Procedure date:   04/30/2010  Findings:      NSR with PVC's in a bigeminal fashion.  Impression & Recommendations:  Problem # 1:  SYNCOPE (ICD-780.2) I suspect his syncopal episode was related to his cardura. If he has another episode, then I would consider placing an ILR. For now will make no additional changes in his medical regimen. His updated medication list for this problem includes:    Toprol Xl 25 Mg Xr24h-tab (Metoprolol succinate) .[name]... Take 1 tablet by mouth once a day    Ecotrin 325 Mg Tbec (Aspirin) .[name]... 1 by mouth once daily    Amlodipine Besylate 5 Mg Tabs (Amlodipine besylate) .[name]... 1 tab once daily  Problem # 2:  HYPERTENSION (ICD-401.9) His blood pressure is at times elevated but normal today. With his h/o syncope, I am inclined not to try and drive his pressure down to low as it may make his predilection to pass out more so. His updated medication list for this problem includes:    Tenex 1 Mg Tabs (Guanfacine hcl) .[name]... 1 by mouth at bedtime    Toprol Xl 25 Mg Xr24h-tab (Metoprolol succinate) .[name]... Take 1 tablet by mouth once a day    Ecotrin 325 Mg Tbec (Aspirin) .[name]... 1 by mouth once daily    Amlodipine Besylate 5 Mg Tabs (Amlodipine besylate) .[name]... 1 tab once daily  Problem # 3:  PREMATURE VENTRICULAR CONTRACTIONS, FREQUENT (ICD-427.69) This appears to be a new problem. While he is minimally symptomatic, will have to watch these. I plan to obtain an ECG when he returns. If he has evidence of this being more prevalent than an anti-arrhythmic drug would be recommended. His updated medication list for this problem includes:    Toprol Xl 25 Mg Xr24h-tab (Metoprolol succinate) .[name]... Take 1 tablet by mouth once a day    Ecotrin 325 Mg Tbec (Aspirin) .[name]... 1 by mouth once daily    Amlodipine Besylate 5 Mg Tabs (Amlodipine besylate) .[name]... 1 tab once daily  Patient Instructions: 1)  Your physician recommends that you schedule a follow-up appointment in: 3 months with dr Shizue Kaseman 2)  Your  physician recommends that you continue on your current medications as directed. Please refer to the Current Medication list given to you today.

## 2010-06-06 LAB — CBC
MCH: 29.7 pg (ref 26.0–34.0)
MCH: 29.7 pg (ref 26.0–34.0)
Platelets: 256 10*3/uL (ref 150–400)
Platelets: 294 10*3/uL (ref 150–400)
RBC: 5.09 MIL/uL (ref 4.22–5.81)
RBC: 5.65 MIL/uL (ref 4.22–5.81)
RDW: 14.5 % (ref 11.5–15.5)
WBC: 8.5 10*3/uL (ref 4.0–10.5)
WBC: 9.2 10*3/uL (ref 4.0–10.5)

## 2010-06-06 LAB — POCT CARDIAC MARKERS
CKMB, poc: 4.7 ng/mL (ref 1.0–8.0)
Myoglobin, poc: 274 ng/mL (ref 12–200)

## 2010-06-06 LAB — DIFFERENTIAL
Eosinophils Absolute: 0.2 10*3/uL (ref 0.0–0.7)
Lymphs Abs: 0.9 10*3/uL (ref 0.7–4.0)
Neutrophils Relative %: 80 % — ABNORMAL HIGH (ref 43–77)

## 2010-06-06 LAB — URINALYSIS, ROUTINE W REFLEX MICROSCOPIC
Bilirubin Urine: NEGATIVE
Hgb urine dipstick: NEGATIVE
Ketones, ur: NEGATIVE mg/dL
Protein, ur: NEGATIVE mg/dL
Urobilinogen, UA: 0.2 mg/dL (ref 0.0–1.0)

## 2010-06-06 LAB — CARDIAC PANEL(CRET KIN+CKTOT+MB+TROPI)
Relative Index: 2.4 (ref 0.0–2.5)
Relative Index: 2.8 — ABNORMAL HIGH (ref 0.0–2.5)
Total CK: 245 U/L — ABNORMAL HIGH (ref 7–232)
Troponin I: 0.01 ng/mL (ref 0.00–0.06)

## 2010-06-06 LAB — POCT I-STAT, CHEM 8
Creatinine, Ser: 1.4 mg/dL (ref 0.4–1.5)
HCT: 53 % — ABNORMAL HIGH (ref 39.0–52.0)
Hemoglobin: 18 g/dL — ABNORMAL HIGH (ref 13.0–17.0)
Potassium: 3.8 mEq/L (ref 3.5–5.1)
Sodium: 139 mEq/L (ref 135–145)

## 2010-06-06 LAB — BASIC METABOLIC PANEL
BUN: 20 mg/dL (ref 6–23)
CO2: 27 mEq/L (ref 19–32)
Calcium: 9 mg/dL (ref 8.4–10.5)
Creatinine, Ser: 1.28 mg/dL (ref 0.4–1.5)
GFR calc Af Amer: >60 mL/min (ref >60)

## 2010-06-06 LAB — LIPID PANEL
Cholesterol: 190 mg/dL (ref 0–200)
HDL: 66 mg/dL (ref >39)
LDL Cholesterol: 116 mg/dL — ABNORMAL HIGH (ref 0–99)
Total CHOL/HDL Ratio: 2.9 RATIO
Triglycerides: 41 mg/dL (ref <150)

## 2010-07-05 LAB — CARDIAC PANEL(CRET KIN+CKTOT+MB+TROPI)
CK, MB: 4.4 ng/mL — ABNORMAL HIGH (ref 0.3–4.0)
CK, MB: 8.2 ng/mL — ABNORMAL HIGH (ref 0.3–4.0)
Relative Index: 2.5 (ref 0.0–2.5)
Relative Index: 2.5 (ref 0.0–2.5)
Total CK: 324 U/L — ABNORMAL HIGH (ref 7–232)
Troponin I: <0.01 ng/mL (ref 0.00–0.06)
Troponin I: <0.01 ng/mL (ref 0.00–0.06)

## 2010-07-05 LAB — BASIC METABOLIC PANEL
BUN: 26 mg/dL — ABNORMAL HIGH (ref 6–23)
Chloride: 102 mEq/L (ref 96–112)
Creatinine, Ser: 1.08 mg/dL (ref 0.4–1.5)
GFR calc Af Amer: >60 mL/min (ref >60)
GFR calc non Af Amer: >60 mL/min (ref >60)

## 2010-07-05 LAB — DIFFERENTIAL
Eosinophils Absolute: 0.1 10*3/uL (ref 0.0–0.7)
Lymphocytes Relative: 19 % (ref 12–46)
Lymphs Abs: 1.6 10*3/uL (ref 0.7–4.0)
Monocytes Relative: 10 % (ref 3–12)
Neutro Abs: 5.7 10*3/uL (ref 1.7–7.7)
Neutrophils Relative %: 69 % (ref 43–77)

## 2010-07-05 LAB — GLUCOSE, CAPILLARY
Glucose-Capillary: 101 mg/dL — ABNORMAL HIGH (ref 70–99)
Glucose-Capillary: 110 mg/dL — ABNORMAL HIGH (ref 70–99)
Glucose-Capillary: 202 mg/dL — ABNORMAL HIGH (ref 70–99)

## 2010-07-05 LAB — CBC
MCV: 87.8 fL (ref 78.0–100.0)
Platelets: 352 10*3/uL (ref 150–400)
RBC: 5.58 MIL/uL (ref 4.22–5.81)
WBC: 8.2 10*3/uL (ref 4.0–10.5)

## 2010-07-05 LAB — POCT CARDIAC MARKERS: Myoglobin, poc: 75.4 ng/mL (ref 12–200)

## 2010-07-05 LAB — TSH: TSH: 2.331 u[IU]/mL (ref 0.350–4.500)

## 2010-07-05 LAB — PHOSPHORUS: Phosphorus: 3.6 mg/dL (ref 2.3–4.6)

## 2010-07-05 LAB — PROTIME-INR: Prothrombin Time: 13.6 seconds (ref 11.6–15.2)

## 2010-07-05 LAB — MAGNESIUM: Magnesium: 2.2 mg/dL (ref 1.5–2.5)

## 2010-08-07 ENCOUNTER — Encounter: Payer: Self-pay | Admitting: Internal Medicine

## 2010-08-08 NOTE — Consult Note (Signed)
[name], [name] NO.:  [id number]   MEDICAL RECORD NO.:  [id number]         PATIENT TYPE:  CINP   LOCATION:                               FACILITY:  [facility]   PHYSICIAN:  Doylene Canning. Ladona Ridgel, MD    DATE OF BIRTH:  1951-01-19   DATE OF CONSULTATION:  07/06/2008  DATE OF DISCHARGE:                                 CONSULTATION   REQUESTING PHYSICIANS:  1. Noralyn Pick. Eden Emms, MD, [facility]  2. Therisa Doyne, MD   INDICATION FOR CONSULTATION:  Evaluation of unexplained syncope.   HISTORY OF PRESENT ILLNESS:  The patient is a 60 year old man who has a  history of hypertension, as well as a history of ADD.  He also has a  history of benign prostatic hypertrophy.  The patient has never had  syncope in the past.  He has a remote history of negative stress test in  the past.  He has been followed by Dr. Sandrea Hughs, who is his primary  physician.  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  As it turned out his daughter, who is also a Clinical research associate, was  going to lead the class and as he was about to introduce her, he felt  like he needed to use bathroom and urinated.  At the end of urination,  the patient became dizzy and hot, lightheaded, and subsequently passed  out.  The patient found himself on the floor.  It sounds like he bumped  the back of his head and had a small laceration.  He awoke and went back  out and ultimately decided to go home where his wife after discussing  the treatment options presented to the emergency department.  In the  emergency department, the patient was found to have intermittent  episodes of bradycardia with heart rates down in the 30s.  With this, he  would feel like he was about to pass out but did not quite pass out,  always has been able to avoid frank syncope.  He noted that his vision  will become gray.  His wife who is with him at the time noted  that he  was diaphoretic and clammy, but he denied any additional pain, denied  any tongue biting, not had any loss of bowel or bladder continence.  He  is admitted for evaluation.  Recently, his medications were changed.  He  had been on Dexedrine for his ADD but was recently switched to Focalin.  No other recent changes with his medicines.   CURRENT MEDICATIONS:  1. Toprol-XL 50 a day.  2. Norvasc 5 a day.  3. Tenex 1 mg daily.  4. Maxzide 25 a day.  5. Flomax 0.4 a day.  6. Proscar 5 a day.  7. Zoloft 25 a day.  8. Focalin (dose unknown) daily.   He has a history of allergy to PENICILLIN.   PAST MEDICAL HISTORY:  As previously noted.   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  He drinks several alcoholic beverages weekly but denies  alcohol excess, denies caffeine excess.  There is no history of illicit  drug use.   FAMILY HISTORY:  Notable for no premature coronary disease although  there is heart disease in the family.   ADDITIONAL REVIEW OF SYSTEMS:  Notable for some problems in the past  with sleeping.  His wife states that he has been clammy and sweaty in  the last few weeks.  He specifically denies chest pain, shortness of  breath, PND, orthopnea.  He denies change in bowel or bladder habit.  All other systems in the review of systems were reviewed and negative  except as noted in the HPI.   PHYSICAL EXAMINATION:  GENERAL:  He is a pleasant, well-appearing middle-  aged man, in no distress.  VITAL SIGNS:  Blood pressure was 130/80, the pulse was 87 and regular,  respirations were 18.  HEENT:  Normocephalic and atraumatic.  Pupils equal and round.  Oropharynx moist.  Sclerae anicteric.  NECK:  No jugular venous distention.  No thyromegaly.  Trachea is  midline.  Carotids are 2+ and symmetric.  LUNGS:  Clear bilaterally to auscultation.  No wheezes, rales, or  rhonchi are present.  There is no increased work of breathing.   CARDIOVASCULAR:  Regular rate and rhythm.  Normal S1 and S2.  There is  no S3 or S4 gallop appreciated.  There are no murmurs or rubs noted.  ABDOMEN:  Soft, nontender.  There is no organomegaly.  EXTREMITIES:  No cyanosis, clubbing, or edema.  Pulses were 2+ and  symmetric.  SKIN:  Normal.  NEUROLOGIC:  Alert and oriented x3 with cranial nerves intact.  Strength  is 5/5 and symmetric.   The EKG demonstrates sinus rhythm with normal axis and intervals.  Initial EKG in the emergency department demonstrates sinus bradycardia  with a competing accelerated junctional rhythm.  Review of the telemetry  strips demonstrate occasional PVCs and occasional periods of bradycardia  with transient decrease in his heart rates, in the 30s.   IMPRESSION:  1. Single isolated episode of unexplained syncope with multiple      additional episodes, all initially occurring for the first in a 29-      year-old man who was recently placed on the drug Focalin.  2. Hypertension.  3. Benign prostatic hypertrophy.   DISCUSSION:  The etiology of the patient's symptoms is unclear.  At the  top of the list of differential diagnosis is a neurally mediated  phenomena, which could perhaps have been exacerbated by his new  medication Focalin, which was begun 2 weeks ago.  Second option is, the  patient does have underlying sinus node dysfunction and sinus node  disease.  Going against this is at his relative young age and the way  that his spells are described, I think point more towards a neurally  mediated mechanism.  Additional possibilities would be in excess of  Toprol or other antihypertensive medications.  To this end, my  recommendation would be to proceed with head-up tilt table testing, and  we will plan on obtaining a 2-D echo.  We will watch the patient  carefully.  Overall, I suspect that the new medication may be related to  his symptoms as Focalin has been described to cause syncope, though it   typically is more commonly associated with tachycardia.  We should also  note that the patient had an  elevation of his CPK, which also goes with  Focalin.  There is a remote history of heart catheterization, which was  apparently negative.  We will plan to follow the patient on telemetry  monitoring, and if his 2-D echo is normal and tilt table test are okay,  we will allow him to be discharged, perhaps with a CardioNet monitor.  If the patient while in the supine position  has prolonged episodes of bradycardia, I think this would much more  strongly point the way towards underlying sinus node dysfunction though  I would like to wait that his current medications wash out a bit before  subscribing him to a pacemaker if at all possible.      Doylene Canning. Ladona Ridgel, MD  Electronically Signed     GWT/MEDQ  D:  07/06/2008  T:  07/07/2008  Job:  [id number]   cc:   Charlaine Dalton. Sherene Sires, MD, FCCP

## 2010-08-08 NOTE — Op Note (Signed)
[name], [name] NO.:  [id number]   MEDICAL RECORD NO.:  [id number]          PATIENT TYPE:  INP   LOCATION:  2903                         FACILITY:  [facility]   PHYSICIAN:  Doylene Canning. Ladona Ridgel, MD    DATE OF BIRTH:  05/27/1950   DATE OF PROCEDURE:  DATE OF DISCHARGE:                               OPERATIVE REPORT   PROCEDURE PERFORMED:  Head-up tilt table testing.   INDICATIONS:  Unexplained syncope.   INTRODUCTION:  The patient is a 60 year old male with hypertension and  ADD who has never had syncope until yesterday when he presented just  after urination stepping away from the urinal and feeling lightheaded  and subsequently passing out.  He denies loss of bowel or bladder  incontinence or tongue biting.  He had a very small laceration on the  scalp.  He was admitted to the hospital.  He subsequently was found to  have periods of intermittent bradycardia on cardiac monitoring and is  now referred for head-up tilt table testing.   PROCEDURE:  After informed consent was obtained, the patient was taken  to the diagnostic EP lab in the fasting state.  He was placed in the  supine position where his was initial heart rate was in the 90s and then  his blood pressure was in the 140s.  He was placed in the 70 degrees  head-up tilt table position.  Just prior to tilting, his heart rate went  down to the high 70s.  Blood pressure remained in the 140s and with 70  degrees head-up tilt table testing, he was maintained in the upright  position for 45 minutes.  During this time, the blood pressure decreased  gradually reaching its lowest point of 95/62 without significant change  in his heart rate.  He felt clammy and poorly but never passed out.  He  was maintained in the 70 degrees head up tilt table position for 45  minutes and there and there was no significant change in his vitals.  Heart rates remained in the 80-100 range and blood pressures remained in  the low  100-110 range systolic.  At 40 minutes into tilting, sequential  right and left carotid sinus massage were carried out and this  demonstrated no evidence of syncope and no further reductions in heart  rate or blood pressure.  Following this, he returned to the supine  position and then returned to his room in satisfactory condition.   COMPLICATIONS:  There were no immediate procedure complications.   RESULTS:  This demonstrates a negative head-up tilt table test for  inducible syncope.  There was no significant bradycardia induced.  The  patient had rare PVCs.      Doylene Canning. Ladona Ridgel, MD  Electronically Signed     GWT/MEDQ  D:  07/06/2008  T:  07/07/2008  Job:  [id number]

## 2010-08-08 NOTE — H&P (Signed)
NAMEIZAAC, REISIG NO.:  [id number]   MEDICAL RECORD NO.:  [id number]          PATIENT TYPE:  INP   LOCATION:  1828                         FACILITY:  [facility]   PHYSICIAN:  Therisa Doyne, MD    DATE OF BIRTH:  [birth year]   DATE OF ADMISSION:  07/05/2008  DATE OF DISCHARGE:                              HISTORY & PHYSICAL   PRIMARY CARE PHYSICIAN:  Casimiro Needle B. Sherene Sires, MD, FCCP.   PRIMARY CARDIOLOGIST:  Jesse Sans. Wall, MD, [facility]   CHIEF COMPLAINT:  Syncope, symptomatic bradycardia, and sinus node  dysfunction.   HISTORY OF PRESENT ILLNESS:  A 60 year old white male with a past  medical history significant for hypertension, depression, ADD and benign  prostatic hypertrophy who presents for an evaluation of syncope.  The  patient was in his usual state of health, when he began to feel  lightheaded, nauseous and dizzy.  He had a frank syncopal event.  He was  brought to the emergency department, and in the emergency department he  was noted to have profound episodes of sinus bradycardia, as well as a  junctional bradycardia, with approximately 2-3 second pauses noted on  telemetry.  He denies any prior history of syncope, and he also denies a  history of palpitations or tachycardia.  He denies myocardial  infarction, exertional angina, dyspnea on exertion or heart failure  symptoms.  Of note, there were medication changes recently, with the  addition of Focalin to his medication regimen for ADD.   REVIEW OF SYSTEMS:  All systems were reviewed and are negative, except  as in history of present illness.   PAST MEDICAL HISTORY:  1. Hypertension.  2. ADD.  3. Benign prostatic hypertrophy.   SOCIAL HISTORY:  The patient lives at home with his wife.  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  He drinks 3  glasses of wine per week.  He denies any tobacco or drugs.   FAMILY HISTORY:  There is no pertinent family history regarding the  patient's  presentation.   MEDICATIONS:  1. Toprol XL 50 mg daily.  2. Norvasc 5 mg daily.  3. Tenex 1 mg daily.  4. Maxzide 25 mg daily.  5. Flomax 0.4 mg daily.  6. Proscar 5 mg daily.  7. Zoloft 25 mg daily.  8. Focalin daily.   ALLERGIES:  PENICILLIN.   PHYSICAL EXAMINATION:  VITAL SIGNS:  Temperature afebrile.  Blood  pressure 133/87, pulse 87, respirations 18, oxygen saturation 90% on  room air.  GENERAL:  In no acute distress.  HEENT:  Normocephalic and atraumatic.  Pupils are equal, round and  reactive to light and accommodation.  Extraocular movements are intact.  Oral mucosa pink and moist without any lesions.  NECK:  Supple without lymphadenopathy or jugular venous distention.  CARDIOVASCULAR:  Regular rate and rhythm.  No murmurs, rubs or gallops.  LUNGS:  Clear to auscultation bilaterally.  ABDOMEN:  Positive bowel sounds; soft, nontender and nondistended.  EXTREMITIES:  No clubbing, cyanosis or edema.  Dorsalis pedis pulses 2+  bilaterally.  SKIN:  No rashes.  BACK:  No CVA tenderness.   PERTINENT LABORATORY DATA:  1. Hypokalemia with a potassium of 3.3.  Glucose 219.  Normal renal      function.  Normal CBC.  Troponin less than 0.05.  Magnesium 3.2.  2. EKG shows sinus bradycardia at 56 beats per minute.  There is mild      J-point elevation; however, there is no frank ST segment elevation      and no acute ST or T-wave changes.  3. Telemetry tracings show severe sinus bradycardia with approximately      2-3 second pauses.  4. CT scan of the head shows no acute intracranial abnormalities.   ASSESSMENT AND PLAN:  Mr. Stuber is a 60 year old white male with a past  medical history of hypertension.  He was on Toprol XL.  He presents for  an evaluation of a syncopal episode.  He was noted to have symptomatic  bradycardia, with evidence of sinus node dysfunction.   1. Will remit patient to the [facility] Cardiology services, Dr. Vern Claude      care.   1. Syncope:  I think this  syncopal episode was most likely secondary      to an arrhythmogenic event, and this was most likely symptomatic      bradycardia.  He has evidence of some sinus node dysfunction, with      approximately 2-3 second pauses with severe sinus bradycardia.  At      this time he has an excellent blood pressure and his bradycardia      events in the emergency department were very short lived, and      lasted no longer than 5-10 seconds.  The longest pause observed in      the emergency department was only 2 seconds.  In addition, there      was no sign of heart block on his EKG or telemetry.  Because of      this, I do not think a temporary pacing wire is indicated at this      time.  However, we will monitor the patient closely and place him      in the Cardiac ICU.  We will have atropine at the bedside and keep      the Zoll patch on the patient should he clinically decompensate.      We will rule him out for a myocardial infarction with serial      cardiac enzymes.  We will hold any AV nodal blocking agents,      including his Toprol XL.  We will check an echocardiogram to assess      left ventricular function.  Will replace his potassium and check a      TSH.  After a period of observation in the ICU, and giving time for      his beta blocker to wash out, he will need to be evaluated for a      pacemaker.  Should he continue to have episodes, I think he would      be a candidate for pacemaker implantation.   1. Hypertension:  Hold Toprol and Norvasc.  We will continue on Xanax      and Maxzide.   1. Benign prostatic hypertrophy:  Continue Flomax and Proscar.   1. ADD:  Continue Zoloft and Focalin.   1. Fluids, Electrolytes and Nutrition:  Normal saline and KVO.      Replace potassium, as he is hypokalemic.  Place him  n.p.o. after      midnight.   1. DVT Prophylaxis:  Subcutaneous heparin.      Therisa Doyne, MD  Electronically Signed     SJT/MEDQ  D:  07/05/2008  T:   07/05/2008  Job:  [id number]   cc:   Charlaine Dalton. Sherene Sires, MD, FCCP

## 2010-08-08 NOTE — Discharge Summary (Signed)
NAMEROMARI, GASPARRO NO.:  [id number]   MEDICAL RECORD NO.:  [id number]          PATIENT TYPE:  INP   LOCATION:  2032                         FACILITY:  [facility]   PHYSICIAN:  Doylene Canning. Ladona Ridgel, MD    DATE OF BIRTH:  1950-12-25   DATE OF ADMISSION:  07/05/2008  DATE OF DISCHARGE:  07/07/2008                               DISCHARGE SUMMARY   PRIMARY CARDIOLOGIST:  Maisie Fus C. Daleen Squibb, MD, [facility]   PRIMARY CARE Samone Guhl:  Charlaine Dalton. Sherene Sires, MD, FCCP   PSYCHIATRY:  Milagros Evener, MD   DISCHARGE DIAGNOSIS:  Syncope.   SECONDARY DIAGNOSES:  1. Bradycardia.  2. Hypertension.  3. Attention-deficit disorder.  4. Benign prostatic hypertrophy.   ALLERGIES:  PENICILLIN.   PROCEDURES:  1. A 2-D echocardiogram on July 06, 2008, showing ejection fraction      of 55-65%.  2. On July 06, 2008, head up tilt table testing, which was negative      for inducible syncope without significant bradycardia.   HISTORY OF PRESENT ILLNESS:  A 60 year old Caucasian male with the above  problem list.  He was in his usual state of health until July 05, 2008,  when he began to feel lightheaded, nauseated, and dizzy after urination.  He had subsequent syncope, see department, where he was found to have  sinus bradycardia and quickly regained consciousness.  He had recurrent  presyncope and was taken to the [facility] ED.  There he was noted to  have sinus bradycardia as well as junctional bradycardia with pauses of  2-3 seconds without upright posture or pain.  His cardiac markers were  normal and ECG showed no acute ST or T changes.  The patient was  admitted for further evaluation.   HOSPITAL COURSE:  The patient ruled out for MI.  Electrophysiology was  consulted.  The patient was seen by Dr. Lewayne Bunting who recommended 2-D  echocardiogram and head up tilt table testing.  Echocardiogram showed  normal LV function while tilt table testing was negative for inducible  syncope without  symptomatic bradycardia or tachycardia.   [name] reports that he was recently initiated on Focalin for his  attention-deficit disorder.  This is the only medication change  recently.  We have recommended discontinuation of Focalin and I have  recommended that he follow up with his psychiatrist for an alternate  medication.  He had previously been taking Dexedrine.  Further, we have  recommended that he reduce his Toprol dose from 50 to 25 mg daily.   We have arranged for [name] to be discharged home today.  He will  pick up a LifeWatch monitor at our office this afternoon.  He has been  scheduled for an exercise treadmill test on July 23, 2008, at 11 a.m.  He will subsequently follow up with Dr. Lewayne Bunting on Jul 26, 2008, at  4 p.m.  [name] being discharged home today in good condition.   DISCHARGE LABORATORY DATA:  Hemoglobin 15.8, hematocrit 49.2, WBC 8.2,  and platelets 352.  INR 1.0.  Sodium 137, potassium 3.3 (on admission),  chloride 102, CO2 of 25, BUN 26, creatinine 1.08, glucose 219, and  calcium 9.1.  Phosphorus 3.6 and magnesium 2.2.  CK 182, MB 4.4,  troponin I less than 0.01, and TSH 2.331.   DISPOSITION:  The patient will be discharged home today in good  condition.   FOLLOWUP PLANS AND APPOINTMENTS:  As above.  The patient will pick up a  LifeWatch monitor today in our office.  He will undergo exercise  treadmill testing on July 23, 2008, at 11 a.m.  Follow up with Dr.  Lewayne Bunting on Jul 26, 2008, at 4 p.m.   DISCHARGE MEDICATIONS:  1. Metoprolol XL 25 mg daily.  2. Amlodipine 5 mg daily.  3. Guanfacine 1 mg daily.  4. Maxzide 37.5/25 mg daily.  5. Flomax 0.4 mg daily.  6. Finasteride 5 mg daily.  7. Zoloft 75 mg daily.  8. Aspirin 81 mg daily.   The patient has been advised to discontinue Focalin and to discuss with  Dr. [name].   OUTSTANDING LABORATORY STUDIES:  LifeWatch monitor to be set up today.  Exercise treadmill testing on July 23, 2008.   DURATION OF DISCHARGE/ENCOUNTER:  40 minutes including physician time.      [name], ANP      Doylene Canning. Ladona Ridgel, MD  Electronically Signed    CB/MEDQ  D:  07/07/2008  T:  07/08/2008  Job:  [id number]   cc:   Milagros Evener, M.D.  Charlaine Dalton. Sherene Sires, MD, FCCP

## 2010-08-08 NOTE — Assessment & Plan Note (Signed)
[facility]                                 ON-CALL NOTE   [name], [name]                        MRN:          [id number]  DATE:07/05/2008                            DOB:          02-06-1951    PRIMARY CARE PHYSICIAN:  Casimiro Needle B. Sherene Sires, MD, [facility]   [name] called the on-call number this evening and said that  approximately 45 minutes ago he had a strange sensation and then found  himself on the floor with interval of unknown duration loss of  consciousness.  He had hit his head with some bleeding.  He denied any  chest pain, any shortness of breath, any leg swelling, or any other  associated symptoms.  He did complain of a strange aura just before  falling, and he was asking instructions for further evaluation.  I told  him given these symptoms he needs to proceed to the emergency room at  [facility].  He is presently being driven by his wife and that he is now  stable and the event was 45 minutes ago.  I just recommended that he  proceed directly to the ER.     Rogelia Rohrer, MD  Electronically Signed    Clemetine Marker  DD: 07/05/2008  DT: 07/06/2008  Job #: [id number]

## 2010-08-10 ENCOUNTER — Encounter: Payer: Self-pay | Admitting: Internal Medicine

## 2010-08-10 ENCOUNTER — Ambulatory Visit (INDEPENDENT_AMBULATORY_CARE_PROVIDER_SITE_OTHER): Payer: BC Managed Care – PPO | Admitting: Internal Medicine

## 2010-08-10 VITALS — BP 137/85 | HR 81 | Ht 74.0 in | Wt 200.0 lb

## 2010-08-10 DIAGNOSIS — I4949 Other premature depolarization: Secondary | ICD-10-CM

## 2010-08-10 DIAGNOSIS — I4891 Unspecified atrial fibrillation: Secondary | ICD-10-CM

## 2010-08-10 DIAGNOSIS — I10 Essential (primary) hypertension: Secondary | ICD-10-CM

## 2010-08-10 NOTE — Patient Instructions (Signed)
Your physician wants you to follow-up in: 3-4 months with Dr Taylor You will receive a reminder letter in the mail two months in advance. If you don't receive a letter, please call our office to schedule the follow-up appointment.  

## 2010-08-10 NOTE — Progress Notes (Signed)
HPI [name] returns today for followup. He has a history of nonsustained PVCs in a bigeminal fashion, syncope, and paroxysmal atrial fibrillation. He had been seen by [name] several months ago and wore a cardiac monitor which demonstrated sinus rhythm with frequent PVCs in a bigeminal fashion. He has had no syncope and does not have palpitations. He denies chest pain or shortness of breath. He does note that at times when he checks his blood pressure, his heart rate will be very low. Allergies  Allergen Reactions  . Amoxicillin      Current Outpatient Prescriptions  Medication Sig Dispense Refill  . amLODipine (NORVASC) 5 MG tablet Take 5 mg by mouth daily.        [name] aspirin 325 MG EC tablet Take 325 mg by mouth daily.        . Chlorpheniramine-APAP (CORICIDIN) 2-325 MG TABS Take 2-325 mg by mouth as directed.        . finasteride (PROSCAR) 5 MG tablet Take 5 mg by mouth daily.        [name] guanFACINE (TENEX) 1 MG tablet Take 1 mg by mouth at bedtime.        . metoprolol succinate (TOPROL-XL) 25 MG 24 hr tablet Take 25 mg by mouth daily.        . Tamsulosin HCl (FLOMAX) 0.4 MG CAPS Take 0.4 mg by mouth daily.        [name] DISCONTD: Pseudoeph-Doxylamine-DM-APAP (NYQUIL) 60-7.08-22-998 MG/30ML LIQD Take by mouth. Per bottle directions as needed          Past Medical History  Diagnosis Date  . Hypertension   . Bradycardia   . Syncope   . Diverticulitis of colon     colonoscopy 07/11/2000  . Adult attention deficit disorder   . Nephrolithiasis     prostatitis with noncaseating granulomatous inflammation July 2000 negative IPPD.    [name] HBP (high blood pressure)     change flomax to cardura for bp in December 2011    ROS:   All systems reviewed and negative except as noted in the HPI.   Past Surgical History  Procedure Date  . Cystostomy w/ bladder biopsy     from trigone     Family History  Problem Relation Age of Onset  . Heart disease Mother     IHD with CABG  . Kidney  failure Father   . Hypertension Father      History   Social History  . Marital Status: Married    Spouse Name: N/A    Number of Children: N/A  . Years of Education: N/A   Occupational History  . Lawyer    Social History Main Topics  . Smoking status: Never Smoker   . Smokeless tobacco: Not on file  . Alcohol Use: 1.8 oz/week    3 Glasses of wine per week  . Drug Use:   . Sexually Active:    Other Topics Concern  . Not on file   Social History Narrative  . No narrative on file     BP 137/85  Pulse 81  Ht 6\' 2"  (1.88 m)  Wt 200 lb (90.719 kg)  BMI 25.68 kg/m2  Physical Exam:  Well appearing NAD HEENT: Unremarkable Neck:  No JVD, no thyromegally Lymphatics:  No adenopathy Back:  No CVA tenderness Lungs:  Clear. HEART:  IRegular rate rhythm, no murmurs, no rubs, no clicks Abd:  Flat, positive bowel sounds, no organomegally, no rebound, no guarding Ext:  2 plus pulses, no edema, no cyanosis, no clubbing Skin:  No rashes no nodules Neuro:  CN II through XII intact, motor grossly intact  EKG Normal sinus rhythm with PVCs in a bigeminal fashion.  Assess/Plan:

## 2010-08-10 NOTE — Assessment & Plan Note (Signed)
He has had no more symptomatic atrial fibrillation. Will follow.

## 2010-08-10 NOTE — Assessment & Plan Note (Signed)
[name] appears to be having more and more PVCs. Because he is mostly asymptomatic, I recommended appeared of watchful waiting. Ultimately more aggressive action may be required. My plan will be to see him back in several months. If his symptoms change or if he continues to have PVCs in the current pattern, I would have a more 24-hour monitor to quantify the number of PVCs he is actually having. For now he'll continue his low-dose of beta blocker. Consideration for antiarrhythmic drug therapy would also be an option. Review of the morphology of the PVCs suggest that the location of the PVCs is probably not in the right ventricular outflow tract but more likely in the left ventricular outflow tract.

## 2010-08-10 NOTE — Assessment & Plan Note (Signed)
His blood pressure is fairly well controlled. He would continue his current medications and maintain a low-sodium diet.

## 2010-08-11 NOTE — Assessment & Plan Note (Signed)
[facility]                             PULMONARY OFFICE NOTE   [name], [name]                      MRN:          [id number]  DATE:05/08/2006                            DOB:          1950/10/10    PRIMARY SERVICE COMPREHENSIVE EVALUATION   HISTORY:  A 60 year old white male, never smoker, with a history of very  difficult to control hypertension requiring four different  antihypertensives but doing well to date with adequate exercise  tolerance with no orthopnea, PND, TIA, claudication symptoms, or  exertional chest pain; however, he has been very sedentary.  He states  stress is in general better, and he denies any morning headaches or leg  swelling.   PAST MEDICAL HISTORY:  1. Severe difficult-to-control hypertension.  2. Nephrolithiasis/BPH, followed by Dr. [name].  3. Diverticulosis with most recent colonoscopy in 2002; therefore in      computer for recall in 2012.  4. Prostatitis with noncaseating granulomatous inflammation documented      in July, 2000 with negative TB test.  5. Mild obesity with mild resting hyperglycemia, not in the diabetic      range.  6. Significant for adult attention-deficit disorder for which he has      been seeing Dr. Willaim Bane but plans to switch to Dr. Colon Branch since      Dr. Willaim Bane has retired.   ALLERGIES:  PENICILLIN caused a rash.   MEDICATIONS:  Taken in detail on the work sheet, correct in the column  dated May 08, 2006.   FAMILY HISTORY:  Positive for kidney failure in his father, who had  hypertension as well.  Positive for ischemic heart disease in his  mother, who underwent coronary artery disease, bypass grafting at age  62.  No form of any cancer or diabetes in his family to his knowledge.  He has one sibling in his sister, who is younger than he and healthy.   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  He has  never been a smoker.  He denies any excess  alcohol use.   REVIEW OF SYSTEMS:  Taken in detail on the work sheet and negative  except as outlined above.   PHYSICAL EXAMINATION:  GENERAL:  This is a quite pleasant, ambulatory  white male in no acute distress.  VITAL SIGNS:  Blood pressure 112/74.  After taking his morning  medicines, the pulse rate is 70.  HEENT:  Ocular exam reveals no significant retinal arterial change.  Oropharynx is clear.  Dentition was intact.  NECK:  Supple without cervical adenopathy or tenderness.  Trachea is  midline.  No thyromegaly.  LUNGS:  Perfectly clear bilaterally to auscultation and percussion.  HEART:  Regular rhythm without murmur, rub or gallop.  There is a  displaced PMI.  ABDOMEN:  Soft, benign with no palpable organomegaly, masses, or  tenderness.  There is only aortic enlargement.  Femoral pulses were  present bilaterally with no bruits.  GENITOURINARY/RECTAL:  Per Dr. Jonny Ruiz.  EXTREMITIES:  Warm without calf tenderness, clubbing, cyanosis or edema.  Pedal pulses  were present bilaterally.  NEUROLOGIC:  No focal deficits or pathologic reflexes.  SKIN:  Warm and dry.   EKG revealed normal sinus rhythm with no ischemic or hypertrophic  change.  LDL cholesterol was 114 with an HDL of 53.  TSH was normal.  CRP was normal.  Fasting blood sugar was 106.  CBC was normal.   IMPRESSION:  1. Hypertension is well controlled on his present regimen, which was      not changed.  I am concerned about the severity of his sedentary      level in terms of being a risk factor for him for atherosclerotic      disease and on the other hand, I note that his HDL cholesterol is      relatively high; therefore, I have just advised him to be on the      lookout for specific symptoms suggesting angina and asked him to      continue to be as physically active as possible.  2. Benign prostatic hypertrophy/noncaseating granulomatous changes by      biopsy by Dr. [name] with no recent significant obstructive       symptoms.  3. Diverticulosis by most recent colonoscopy in 2002 in the computer      for recall.  4. Improving fasting hyperglycemia with no evidence of overt diabetes.      Nevertheless, he does need to maintain good control of his weight,      and we discussed this issue today as well.  5. General health maintenance:  He was updated on tetanus in 2000.      Not yet a candidate for Pneumovax.  6. Followup:  Yearly, sooner p.r.n.  (He does self-monitor blood      pressures at home).     Charlaine Dalton. Sherene Sires, MD, [facility]  Electronically Signed    MBW/MedQ  DD: 05/10/2006  DT: 05/10/2006  Job #: [id number]

## 2010-08-11 NOTE — Op Note (Signed)
[facility]  Patient:    [name], [name]                MRN: [id number] Proc. Date: 11/16/99 Attending:  Excell Seltzer. [name], M.D.                           Operative Report  OPERATION:  Cystoscopy, bladder biopsy and fulguration.  PREOPERATIVE DIAGNOSIS:  Bladder lesion, hematuria.  POSTOPERATIVE DIAGNOSIS:  Bladder lesion, hematuria.  OPERATION:  Cystoscopy, bladder biopsy and fulguration.  SURGEON:  Excell Seltzer. [name], M.D.  ANESTHESIA:  General.  SPECIMENS:  Bladder biopsy from trigone.  COMPLICATIONS:  None.  INDICATIONS:  [name] is a 60 year old white male, who recently presented with hematuria on office cystoscopy.  It was felt the bleeding was from the prostatic urethra.  However, in the trigone there were a couple of raised lesions that were slightly erythematous with some small calcifications apparent.  These did not appear completely typical for tumors but were abnormal and was felt to require biopsy.  FINDINGS:  The patient was taken to the operating room where a general anesthetic was induced.  He was placed in the lithotomy position.  His perineum and genitalia were prepped with Betadine solution, and he was draped in the usual sterile fashion.  He was scoped with a 22 [place] scope and the 12 and 70 degree lens.  Examination revealed a normal urethra and intact external sphincter.  The prostatic urethra had trilobar hyperplasia with some degree of obstruction.  Examination of the bladder revealed mild trabeculation. Ureteral orifices were in the normal anatomic position effluxing clear urine. In the trigone there were two slightly raised lesions that were slightly erythematous with some calcific type changes.  These were then biopsied with a cut biopsy forceps and the area of the lesion and surrounding was lightly fulgurated with a Bovie electrode.  At this point once hemostasis was achieved, the bladder was drained.  This scope  was removed and the patient was taken down from the lithotomy position.  His anesthetic was reversed and he was admitted to the recovery room in stable condition.  There were no complications. DD:  11/16/99 TD:  11/17/99 Job: [id number]

## 2010-08-11 NOTE — Assessment & Plan Note (Signed)
[name]                             PULMONARY OFFICE NOTE   [name], [name]                      MRN:          [id number]  DATE:06/25/2006                            DOB:          March 25, 1951    HISTORY OF PRESENT ILLNESS:  The patient is a 60 year old white male  patient of Dr. Thurston Hole  who has a known history of hypertension, BPH and  adult attention deficit disorder that presents today for an acute office  visit. The patient complains over the last four days that he has  intermittent episodes of some right lower abdominal discomfort with  associated nausea and loose stools. The patient reports the symptoms  seem to be mild in nature and unchanged in terms of severity since  onset. He denies any associated fever, blood stools, urinary symptoms,  recent travel or antibiotic use. Has been using some intermittent Advil  for arthritic complaints, but not on a consistent basis. The patient has  not taken any medications for treatment. He denies any chest pain,  shortness of breath or rash.   PAST MEDICAL HISTORY:  Reviewed in detail.   CURRENT MEDICATIONS:  Reviewed and correct.   PHYSICAL EXAMINATION:  The patient is tall, thin, white male in no acute  distress. He is afebrile with stable vital signs. O2 saturation is 97%  on room air.  HEENT: Sclerae is nonicteric. Posterior pharynx is clear. Oral mucosa is  pink and moist.  NECK: Supple without cervical adenopathy. No JVD.  LUNGS: Lung sounds are clear to auscultation bilaterally.  CARDIAC: S1, S2 without murmur, rub or gallop.  ABDOMEN: Soft, with positive bowel sounds throughout all quadrants. No  guarding or rebound noted. No abdominal bruits or masses are appreciate.  Negative costovertebral angle tenderness.  EXTREMITIES: Femoral pulses are intact. No bruits are noted. Warm  without any calf cyanosis, clubbing or edema.   IMPRESSION/PLAN:  Mild right lower abdominal discomfort of  questionable  etiology. Possible mild gastroenteritis. The patient is to advance a  bland diet as tolerated. Avoid lactose products for the next five days.  The patient will be given Protonix 40 mg daily over the next five days.  May use Levsin sublingual 0.125 mg tablet as needed. The patient is  advised if symptoms do  not improve or worsen he is to contact us immediately or go to the  closest emergency department. Urinalysis is pending at time of  dictation.      Rubye Oaks, NP  Electronically Signed      Charlaine Dalton. Sherene Sires, MD, [facility]  Electronically Signed   TP/MedQ  DD: 06/25/2006  DT: 06/25/2006  Job #: [id number]

## 2010-08-11 NOTE — Assessment & Plan Note (Signed)
[facility]                            CARDIOLOGY OFFICE NOTE   [name], [name]                      MRN:          [id number]  DATE:05/30/2006                            DOB:          1950-07-26    EXERCISE TREADMILL   INDICATION FOR STUDY:  Hypertension and a family history of coronary  artery disease which will be 401.1 and V173.   Baseline EKG was normal. Blood pressure was 141/87. He took his Toprol  XL this morning.   He exercised on the Bruce protocol for a total of 10 minutes. He reached  the peak heart rate of 148 beats per minute which is 98% of predicted.  MET level achieved was 11.7.   The test was stopped secondary to fatigue and shortness of breath.   His blood pressure increased appropriately to 164/91, returning to  baseline in recovery.   He had no chest pain or angina, only shortness of breath. There were no  EKG changes and no arrhythmias.   1. Negative exercise treadmill.  2. Relatively good exercise tolerance.  3. No evidence of arrhythmias.     Thomas C. Daleen Squibb, MD, [facility]  Electronically Signed    TCW/MedQ  DD: 05/30/2006  DT: 05/30/2006  Job #: [id number]   cc:   Casimiro Needle B. Sherene Sires, MD, FCCP

## 2010-08-11 NOTE — Assessment & Plan Note (Signed)
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 was added to his regimen by Dr. Jodi Marble, who also  recommended Zoloft 100 mg q. day and trazodone 50 mg nightly for ADD.   The patient overall feels fine now.  He had mild jitteriness initially with  some Dexedrine, but denies any symptoms at all now.  Specifically, he denies  any palpitations, exertional discomfort of any kind, orthopnea, PND, leg  swelling, UTI, or claudication symptoms.   For full inventory of medications, please see face sheet dated January 21, 2006, which is correct as listed.   PHYSICAL EXAM:  He is a robust, pleasant ambulatory white male in no acute  distress.  VITAL SIGNS:  Stable vital signs.  Blood pressure only 118/70, pulse rate is  83 and regular on Toprol 50 mg this morning.  HEENT:  Unremarkable.  Oropharynx is clear.  NECK:  Supple without cervical adenopathy or tenderness. Trachea is midline.  No thyromegaly.  LUNGS:  Fields are clear bilaterally to auscultation and percussion.  CARDIAC:  Regular rate and rhythm with no ectopics noted.  ABDOMEN:  Soft and benign.  EXTREMITIES:  Warm without calf tenderness, cyanosis, clubbing, or edema.   IMPRESSION:  Blood pressure remains well-controlled despite the addition of  Dexedrine to his regimen.  The only concern I have is that he was already on  a complex medical regimen.  Now is taking a total of 10 medications daily  with a significant chance of drug interaction on this basis.  The 1 change I  might consider would be  to switch from Norvasc and Flomax to doxazosin  titrated to adequate control of blood pressure and/or BPH symptoms.  However, the difference of the patient going from 10 medications to 9 is  negligible and therefore I recommend continuing the same medications through  February of 2008 when he is due for a comprehensive health care evaluation.  We will see him sooner if needed.    ______________________________  Charlaine Dalton Sherene Sires, MD, [facility]    MBW/MedQ  DD: 01/21/2006  DT: 01/21/2006  Job #: [id number]

## 2010-09-05 ENCOUNTER — Other Ambulatory Visit: Payer: Self-pay | Admitting: Internal Medicine

## 2010-10-10 ENCOUNTER — Other Ambulatory Visit: Payer: Self-pay | Admitting: Internal Medicine

## 2010-10-16 ENCOUNTER — Telehealth: Payer: Self-pay | Admitting: Internal Medicine

## 2010-10-16 MED ORDER — AMLODIPINE BESYLATE 5 MG PO TABS: 5.0000 mg | ORAL_TABLET | Freq: Every day | ORAL | Status: DC

## 2010-10-16 NOTE — Telephone Encounter (Signed)
Refill of his BP meds have been sent to the pharmacy.  Called and lmom to make pt aware of meds that have been sent to the pharmacy.

## 2010-10-16 NOTE — Telephone Encounter (Signed)
Spoke with pt and sched appt with MW for  11/08/10 and gave him meds to last until then. Rx was refilled.

## 2010-11-08 ENCOUNTER — Encounter: Payer: Self-pay | Admitting: Internal Medicine

## 2010-11-08 ENCOUNTER — Ambulatory Visit (INDEPENDENT_AMBULATORY_CARE_PROVIDER_SITE_OTHER): Payer: BC Managed Care – PPO | Admitting: Internal Medicine

## 2010-11-08 DIAGNOSIS — R55 Syncope and collapse: Secondary | ICD-10-CM

## 2010-11-08 DIAGNOSIS — I10 Essential (primary) hypertension: Secondary | ICD-10-CM

## 2010-11-08 MED ORDER — METOPROLOL SUCCINATE ER 25 MG PO TB24: ORAL_TABLET | ORAL | Status: DC

## 2010-11-08 NOTE — Progress Notes (Signed)
Subjective:     Patient ID: [name], male   DOB: 04/06/1950, 60 y.o.   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   06/2008 One episode syncope eval by Dr Rosette Reveal > reduce toprol by half and leave off dexadrin   November 10, 2008 cpx no aerobics but no limitations and no presyncope. no change in rx.     January 16, 2010 cpx no c/o tia, claudication/sob.  > no change rx  11/08/2010 f/u ov/[name] cc intermittent hbp and sense of palpitations on toprol 25 has appt with Ladona Ridgel 8/17.  Good ex tol, does better when walks regularly and no presyncope ha, tia or claudication. Added back maxzide 25 daily and no better  Pt denies any significant sore throat, dysphagia, itching, sneezing,  nasal congestion or excess/ purulent secretions,  fever, chills, sweats, unintended wt loss, pleuritic or exertional cp, hempoptysis, orthopnea pnd or leg swelling.    Also denies any obvious fluctuation of symptoms with weather or environmental changes or other aggravating or alleviating factors.      Past Medical History:  HYPERTENSION (ICD-401.9)  [name]  SYNCOPE (ICD-780.2)  DIVERTICULITIS OF COLON (ICD-562.11)..........................................................Marland KitchenLina Sar  - colonoscopy 07/11/2000  Health Maintenance...............................................................................................Marland KitchenWert  - DT November 10, 2008  - CPX January 16, 2010  NEPHROLITHIASIS/BPH......................................................................................[name] Dr. [name]  - prostatitis with noncaseating granulomatous inflammation July 2000 negative IPPD  ADULT ADD.........................................................................................................Marland KitchenDr [name]   Family History:  HBP/ kidney failure father  IHD with cabg at 87 mother  one  younger sister healthy no problems   Social History:  The patient lives at home with his wife. 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 He drinks 3  glasses of wine per week.  never smoker       Review of Systems     Objective:   Physical Exam    Ambulatory healthy appearing in no acute distress.  wt 208 > 207 November 10, 2008 > 205 June 03, 2009 > 187 January 16, 2010 > 201 11/08/2010  HEENT: nl dentition, turbinates, and orophanx. Nl external ear canals without cough reflex  Neck without JVD/Nodes/TM normal carotids without bruits  Lungs clear to A and P bilaterally without cough on insp or exp maneuvers  RRR no s3 or murmur or increase in P2 no displacement PMI , occ premature beats noted Abd soft and benign with nl excursion in the supine position. No bruits or organomegaly  Ext warm without calf tenderness, cyanosis clubbing or edema symmetric pedal pulses  Skin warm and dry without lesions except for slight asymmetric freckles lower trunk Assessment:         Plan:

## 2010-11-08 NOTE — Patient Instructions (Addendum)
Increase your metaprolol to 25 mg 2 each am until you see Dr Ladona Ridgel  Take your maxzide bottle with you to the office.  Return after Jan 17 2011 for CPX.

## 2010-11-08 NOTE — Assessment & Plan Note (Signed)
Intermittently quite high and not adequately beta blocked - will try doubling dose of toprol pending f/u with Dr Ladona Ridgel 8/17   In meantime has also resumed maxzide 25 daily and will refill

## 2010-11-08 NOTE — Assessment & Plan Note (Signed)
Defer further titration of BB to Dr Ladona Ridgel in setting of h/o syncope

## 2010-11-10 ENCOUNTER — Encounter: Payer: Self-pay | Admitting: Internal Medicine

## 2010-11-10 ENCOUNTER — Ambulatory Visit (INDEPENDENT_AMBULATORY_CARE_PROVIDER_SITE_OTHER): Payer: BC Managed Care – PPO | Admitting: Internal Medicine

## 2010-11-10 DIAGNOSIS — I4949 Other premature depolarization: Secondary | ICD-10-CM

## 2010-11-10 DIAGNOSIS — I4891 Unspecified atrial fibrillation: Secondary | ICD-10-CM

## 2010-11-10 DIAGNOSIS — I10 Essential (primary) hypertension: Secondary | ICD-10-CM

## 2010-11-10 NOTE — Patient Instructions (Signed)
Your physician recommends that you schedule a follow-up appointment ---will depend on the results of the monitor  Your physician has recommended that you wear a holter monitor. Holter monitors are medical devices that record the heart's electrical activity. Doctors most often use these monitors to diagnose arrhythmias. Arrhythmias are problems with the speed or rhythm of the heartbeat. The monitor is a small, portable device. You can wear one while you do your normal daily activities. This is usually used to diagnose what is causing palpitations/syncope (passing out).---24hour  For PVC's

## 2010-11-10 NOTE — Progress Notes (Signed)
HPI [name] returns today for followup. He is a pleasant 60 year old man with a history of hypertension, syncope, paroxysmal atrial fibrillation, and frequent ventricular ectopy. The patient has been on low-dose beta blocker therapy and a diuretic. He has had no recurrent syncope. He did not feel palpitations. He notes at times his blood pressure has been a little elevated in the  140-150 range. No peripheral edema. Allergies  Allergen Reactions  . Amoxicillin      Current Outpatient Prescriptions  Medication Sig Dispense Refill  . amLODipine (NORVASC) 5 MG tablet Take 1 tablet (5 mg total) by mouth daily.  30 tablet  3  . aspirin 325 MG EC tablet Take 325 mg by mouth daily.        . Chlorpheniramine-APAP (CORICIDIN) 2-325 MG TABS Take 2-325 mg by mouth as directed.        . finasteride (PROSCAR) 5 MG tablet Take 5 mg by mouth daily.        . fluocinonide (LIDEX) 0.05 % cream as needed.      [name] guanFACINE (TENEX) 1 MG tablet Take 1 mg by mouth at bedtime.        . metoprolol succinate (TOPROL-XL) 25 MG 24 hr tablet 2 daily      . Tamsulosin HCl (FLOMAX) 0.4 MG CAPS Take 0.4 mg by mouth daily.        [name] triamterene-hydrochlorothiazide (MAXZIDE-25) 37.5-25 MG per tablet Take 1 tablet by mouth daily.           Past Medical History  Diagnosis Date  . Hypertension   . Bradycardia   . Syncope   . Diverticulitis of colon     colonoscopy 07/11/2000  . Adult attention deficit disorder   . Nephrolithiasis     prostatitis with noncaseating granulomatous inflammation July 2000 negative IPPD.    [name] HBP (high blood pressure)     change flomax to cardura for bp in December 2011    ROS:   All systems reviewed and negative except as noted in the HPI.   Past Surgical History  Procedure Date  . Cystostomy w/ bladder biopsy     from trigone     Family History  Problem Relation Age of Onset  . Heart disease Mother     IHD with CABG  . Kidney failure Father   . Hypertension Father       History   Social History  . Marital Status: Married    Spouse Name: N/A    Number of Children: N/A  . Years of Education: N/A   Occupational History  . Lawyer    Social History Main Topics  . Smoking status: Never Smoker   . Smokeless tobacco: Never Used  . Alcohol Use: 1.8 oz/week    3 Glasses of wine per week  . Drug Use: Not on file  . Sexually Active: Not on file   Other Topics Concern  . Not on file   Social History Narrative  . No narrative on file     BP 118/78  Pulse 84  Ht 6\' 4"  (1.93 m)  Wt 200 lb 12.8 oz (91.082 kg)  BMI 24.44 kg/m2  Physical Exam:  Well appearing NAD HEENT: Unremarkable Neck:  No JVD, no thyromegally Lymphatics:  No adenopathy Back:  No CVA tenderness Lungs:  Clear HEART: Iregular rate rhythm, no murmurs, no rubs, no clicks Abd:  soft, positive bowel sounds, no organomegally, no rebound, no guarding Ext:  2 plus pulses, no edema,  no cyanosis, no clubbing Skin:  No rashes no nodules Neuro:  CN II through XII intact, motor grossly intact  EKG Normal sinus rhythm with ventricular bigeminy.  Assess/Plan:

## 2010-11-10 NOTE — Assessment & Plan Note (Signed)
He has evidence of fairly dense ventricular ectopy. He is in bigeminy today. Review of this electrograms demonstrates probable left ventricular outflow tract VT based on the early transition in V2. He is asymptomatic. I have recommended a 24 hour Holter monitor. This will help [place] understand the significance of his ectopy. Less than 10,000 PVCs and 24-hour period suggests a benign situation. More than 20 or 30,000 PVCs in a 24-hour period would suggest a more serious situation with risk of development of cardiomyopathy. More aggressive treatment would be recommended if the latter situation is present.

## 2010-11-10 NOTE — Assessment & Plan Note (Signed)
He is currently asymptomatic and had no evidence of recurrent arrhythmias. We'll plan to follow.

## 2010-11-10 NOTE — Assessment & Plan Note (Signed)
He will increase his dose of beta blocker after he has worn his 24-hour monitor. He will maintain a low-sodium diet.

## 2010-11-17 ENCOUNTER — Encounter (INDEPENDENT_AMBULATORY_CARE_PROVIDER_SITE_OTHER): Payer: BC Managed Care – PPO

## 2010-11-17 DIAGNOSIS — I10 Essential (primary) hypertension: Secondary | ICD-10-CM

## 2010-11-17 DIAGNOSIS — R002 Palpitations: Secondary | ICD-10-CM

## 2010-11-17 DIAGNOSIS — I4949 Other premature depolarization: Secondary | ICD-10-CM

## 2010-11-24 ENCOUNTER — Telehealth: Payer: Self-pay | Admitting: Internal Medicine

## 2010-11-24 NOTE — Telephone Encounter (Signed)
Pt would like results of monitor

## 2010-11-24 NOTE — Telephone Encounter (Signed)
Spoke with patient and told him what the monitor showed and that he should go ahead and increase his Metoprolol and I would be back in touch with him on Wed when Dr Ladona Ridgel is back in the office

## 2010-11-29 ENCOUNTER — Telehealth: Payer: Self-pay | Admitting: Internal Medicine

## 2010-11-29 MED ORDER — METOPROLOL SUCCINATE ER 50 MG PO TB24: 50.0000 mg | ORAL_TABLET | Freq: Every day | ORAL | Status: DC

## 2010-11-29 NOTE — Telephone Encounter (Signed)
Per last ov not increased toprol 25mg  to toprol 50mg  daily. Refill sent. Pt aware.Carron Curie, CMA

## 2010-12-05 NOTE — Progress Notes (Signed)
Addended by: Judithe Modest D on: 12/05/2010 03:38 PM   Modules accepted: Orders

## 2011-01-02 ENCOUNTER — Encounter: Payer: Self-pay | Admitting: Internal Medicine

## 2011-01-10 ENCOUNTER — Other Ambulatory Visit: Payer: Self-pay | Admitting: Internal Medicine

## 2011-01-29 ENCOUNTER — Telehealth: Payer: Self-pay | Admitting: Internal Medicine

## 2011-01-29 NOTE — Telephone Encounter (Signed)
[name] to call patient and set up an appointment for Nov or Dec per Dr Ladona Ridgel to see how the medication is working

## 2011-01-29 NOTE — Telephone Encounter (Signed)
New message;  Pt wore a monitor and was told he would hear about scheduling another appt.  He has not heard anything.  Please check on this and advise.

## 2011-02-07 ENCOUNTER — Telehealth: Payer: Self-pay | Admitting: Internal Medicine

## 2011-02-07 NOTE — Telephone Encounter (Signed)
Called and spoke with patient will work him in to see Dr Ladona Ridgel in 02/2011  He is not having any problems since we went up on the Metoprolol

## 2011-02-07 NOTE — Telephone Encounter (Signed)
Pt calling wanting to know results of pt wearing Holter monitor. Please return pt call to discuss further.   Please call pt cell if no answer at work:[phone]

## 2011-02-08 ENCOUNTER — Other Ambulatory Visit: Payer: Self-pay | Admitting: Internal Medicine

## 2011-02-13 ENCOUNTER — Encounter: Payer: BC Managed Care – PPO | Admitting: Internal Medicine

## 2011-02-16 ENCOUNTER — Other Ambulatory Visit: Payer: Self-pay | Admitting: Internal Medicine

## 2011-02-20 ENCOUNTER — Encounter: Payer: Self-pay | Admitting: Internal Medicine

## 2011-02-20 ENCOUNTER — Ambulatory Visit (INDEPENDENT_AMBULATORY_CARE_PROVIDER_SITE_OTHER): Payer: BC Managed Care – PPO | Admitting: Internal Medicine

## 2011-02-20 VITALS — BP 120/72 | HR 58 | Ht 76.0 in | Wt 206.8 lb

## 2011-02-20 DIAGNOSIS — I10 Essential (primary) hypertension: Secondary | ICD-10-CM

## 2011-02-20 DIAGNOSIS — I4891 Unspecified atrial fibrillation: Secondary | ICD-10-CM

## 2011-02-20 DIAGNOSIS — R55 Syncope and collapse: Secondary | ICD-10-CM

## 2011-02-20 DIAGNOSIS — I4949 Other premature depolarization: Secondary | ICD-10-CM

## 2011-02-20 DIAGNOSIS — I493 Ventricular premature depolarization: Secondary | ICD-10-CM

## 2011-02-20 NOTE — Assessment & Plan Note (Signed)
Blood pressure is well-controlled today. He will continue his current medical therapy.

## 2011-02-20 NOTE — Progress Notes (Signed)
HPI Mr. Biela returns today for followup. He is a very pleasant 60 year old man with a history of asymptomatic PVCs, remote syncope, hypertension, and severe headaches. Recently, he has been on high-dose verapamil to help control his headaches. The patient also is on beta blockers to help with his PVCs he thinks that they are also improved. He has worn a cardiac monitor which demonstrated that the patient has approximately 13,000 PVCs in a day's duration. He has no other specific complaints today. He denies chest pain, shortness of breath, or syncope. Allergies  Allergen Reactions  . Amoxicillin      Current Outpatient Prescriptions  Medication Sig Dispense Refill  . aspirin 325 MG EC tablet Take 325 mg by mouth daily.        . finasteride (PROSCAR) 5 MG tablet Take 5 mg by mouth daily.        [name] guanFACINE (TENEX) 1 MG tablet TAKE 1 TABLET BY MOUTH ONCE AT BEDTIME  30 tablet  0  . MAXZIDE-25 37.5-25 MG per tablet TAKE 1 TABLET BY MOUTH ONCE DAILY  30 tablet  0  . metoprolol (TOPROL XL) 50 MG 24 hr tablet Take 1 tablet (50 mg total) by mouth daily.  30 tablet  2  . Tamsulosin HCl (FLOMAX) 0.4 MG CAPS Take 0.4 mg by mouth daily.        . verapamil (CALAN) 120 MG tablet Take 120 mg by mouth 4 (four) times daily.           Past Medical History  Diagnosis Date  . Hypertension   . Bradycardia   . Syncope   . Diverticulitis of colon     colonoscopy 07/11/2000  . Adult attention deficit disorder   . Nephrolithiasis     prostatitis with noncaseating granulomatous inflammation July 2000 negative IPPD.    [name] HBP (high blood pressure)     change flomax to cardura for bp in December 2011  . Benign prostatic hypertrophy     ROS:   All systems reviewed and negative except as noted in the HPI.   Past Surgical History  Procedure Date  . Cystostomy w/ bladder biopsy     from trigone  . US echocardiography 07/06/08     Family History  Problem Relation Age of Onset  . Heart disease Mother      IHD with CABG  . Kidney failure Father   . Hypertension Father      History   Social History  . Marital Status: Married    Spouse Name: N/A    Number of Children: N/A  . Years of Education: N/A   Occupational History  . Lawyer    Social History Main Topics  . Smoking status: Never Smoker   . Smokeless tobacco: Never Used  . Alcohol Use: 1.8 oz/week    3 Glasses of wine per week  . Drug Use: Not on file  . Sexually Active: Not on file   Other Topics Concern  . Not on file   Social History Narrative  . No narrative on file     BP 120/72  Pulse 58  Ht 6\' 4"  (1.93 m)  Wt 93.804 kg (206 lb 12.8 oz)  BMI 25.17 kg/m2  Physical Exam:  Well appearing NAD HEENT: Unremarkable Neck:  No JVD, no thyromegally Lymphatics:  No adenopathy Back:  No CVA tenderness Lungs:  Clear HEART:  Regular rate rhythm, no murmurs, no rubs, no clicks Abd:  soft, positive bowel sounds, no organomegally, no  rebound, no guarding Ext:  2 plus pulses, no edema, no cyanosis, no clubbing Skin:  No rashes no nodules Neuro:  CN II through XII intact, motor grossly intact  EKG  DEVICE  Normal device function.  See PaceArt for details.   Assess/Plan:

## 2011-02-20 NOTE — Assessment & Plan Note (Signed)
The patient is currently asymptomatic. Because of this I am reluctant to recommend any additional medical therapy. His density of PVCs is a bit concerning. (>13K/24 hour) I have recommended a period of watchful waiting. If his PVCs increase in frequency and he develops symptoms, then we would consider additional medical therapy.

## 2011-02-20 NOTE — Patient Instructions (Signed)
Your physician wants you to follow-up in: 6 months with Dr Taylor You will receive a reminder letter in the mail two months in advance. If you don't receive a letter, please call our office to schedule the follow-up appointment.  

## 2011-02-20 NOTE — Assessment & Plan Note (Signed)
He has had no recurrent syncope. He will continue a period of watchful waiting.

## 2011-02-21 ENCOUNTER — Encounter: Payer: Self-pay | Admitting: Internal Medicine

## 2011-02-21 ENCOUNTER — Other Ambulatory Visit (INDEPENDENT_AMBULATORY_CARE_PROVIDER_SITE_OTHER): Payer: BC Managed Care – PPO

## 2011-02-21 ENCOUNTER — Ambulatory Visit (INDEPENDENT_AMBULATORY_CARE_PROVIDER_SITE_OTHER): Payer: BC Managed Care – PPO | Admitting: Internal Medicine

## 2011-02-21 ENCOUNTER — Ambulatory Visit (INDEPENDENT_AMBULATORY_CARE_PROVIDER_SITE_OTHER)
Admission: RE | Admit: 2011-02-21 | Discharge: 2011-02-21 | Disposition: A | Discharge status: Home / Self Care | Payer: BC Managed Care – PPO | Source: Ambulatory Visit | Attending: Internal Medicine | Admitting: Internal Medicine

## 2011-02-21 VITALS — BP 90/60 | HR 52 | Temp 97.5°F | Ht 76.0 in | Wt 207.0 lb

## 2011-02-21 DIAGNOSIS — I10 Essential (primary) hypertension: Secondary | ICD-10-CM

## 2011-02-21 DIAGNOSIS — K5732 Diverticulitis of large intestine without perforation or abscess without bleeding: Secondary | ICD-10-CM

## 2011-02-21 DIAGNOSIS — Z87898 Personal history of other specified conditions: Secondary | ICD-10-CM

## 2011-02-21 LAB — CBC WITH DIFFERENTIAL/PLATELET
Basophils Relative: 0.5 % (ref 0.0–3.0)
Eosinophils Absolute: 0.3 10*3/uL (ref 0.0–0.7)
HCT: 46.4 % (ref 39.0–52.0)
Hemoglobin: 15.6 g/dL (ref 13.0–17.0)
MCHC: 33.7 g/dL (ref 30.0–36.0)
MCV: 89.8 fl (ref 78.0–100.0)
Monocytes Absolute: 0.9 10*3/uL (ref 0.1–1.0)
Neutro Abs: 9.3 10*3/uL — ABNORMAL HIGH (ref 1.4–7.7)
RBC: 5.17 Mil/uL (ref 4.22–5.81)

## 2011-02-21 LAB — BASIC METABOLIC PANEL
CO2: 29 mEq/L (ref 19–32)
Chloride: 102 mEq/L (ref 96–112)
Creatinine, Ser: 1.4 mg/dL (ref 0.4–1.5)
Sodium: 140 mEq/L (ref 135–145)

## 2011-02-21 LAB — URINALYSIS
Bilirubin Urine: NEGATIVE
Hgb urine dipstick: NEGATIVE
Ketones, ur: NEGATIVE
Urine Glucose: NEGATIVE
Urobilinogen, UA: 0.2 (ref 0.0–1.0)

## 2011-02-21 LAB — LIPID PANEL
LDL Cholesterol: 111 mg/dL — ABNORMAL HIGH (ref 0–99)
Total CHOL/HDL Ratio: 3
VLDL: 10.6 mg/dL (ref 0.0–40.0)

## 2011-02-21 LAB — HEPATIC FUNCTION PANEL
Bilirubin, Direct: 0.2 mg/dL (ref 0.0–0.3)
Total Bilirubin: 1.3 mg/dL — ABNORMAL HIGH (ref 0.3–1.2)

## 2011-02-21 MED ORDER — VERAPAMIL HCL 120 MG PO TABS: 120.0000 mg | ORAL_TABLET | Freq: Four times a day (QID) | ORAL | Status: DC

## 2011-02-21 NOTE — Patient Instructions (Signed)
Change verapamil to 120 mg twice daily but increase to 4 x daily for headaches or blood pressure control  Don't take the maxzide when you intake of food and water are not normal.  Please remember to go to the lab and x-ray department downstairs for your tests - we will call you with the results when they are available.    Please schedule a follow up visit in 6 months but call sooner if needed

## 2011-02-21 NOTE — Progress Notes (Signed)
Subjective:    Patient ID: [name], male    DOB: 10/23/1950, 60 y.o.   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  06/2008 One episode syncope eval by Dr Rosette Reveal > reduce toprol by half and leave off dexadrin  November 10, 2008 cpx no aerobics but no limitations and no presyncope. no change in rx.  January 16, 2010 cpx no c/o tia, claudication/sob. > no change rx  11/08/2010 f/u ov/Rebecka Oelkers cc intermittent hbp and sense of palpitations on toprol 25 has appt with Ladona Ridgel 8/17. Good ex tol, does better when walks regularly and no presyncope ha, tia or claudication. Added back maxzide 25 daily and no better    02/21/2011 f/u ov/Alene Bergerson stopped the norvasc, started verapamil due to ha's and bp running lower. Minimal pm ha's no nausea or neuro co's  Sleeping ok without nocturnal  or early am exacerbation  of respiratory  C/o's - Also denies any obvious fluctuation of symptoms with weather or environmental changes or other aggravating or alleviating factors except as outlined above       Past Medical History:  HYPERTENSION (ICD-401.9)  [name]  SYNCOPE (ICD-780.2)  DIVERTICULITIS OF COLON (ICD-562.11)..........................................................Marland KitchenLina Sar  - colonoscopy 07/11/2000 > reminder letter sent 08/03/10 Health Maintenance...............................................................................................Marland KitchenWert  - DT November 10, 2008  - CPX 02/21/2011  NEPHROLITHIASIS/BPH......................................................................................[name] Dr. [name]  - prostatitis with noncaseating granulomatous inflammation July 2000 negative IPPD  ADULT ADD.........................................................................................................Marland KitchenDr [name]    Family History:  HBP/ kidney failure father  IHD with  cabg at 18 mother  one younger sister healthy no problems    Social History:  The patient lives at home with his wife. 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 He drinks 3  glasses of wine per week.  never smoker     Review of Systems  Constitutional: Negative for fever, chills, diaphoresis, activity change, appetite change, fatigue and unexpected weight change.  HENT: Negative for hearing loss, ear pain, nosebleeds, congestion, sore throat, facial swelling, rhinorrhea, sneezing, mouth sores, trouble swallowing, neck pain, neck stiffness, dental problem, voice change, postnasal drip, sinus pressure, tinnitus and ear discharge.   Eyes: Negative for photophobia, discharge, itching and visual disturbance.  Respiratory: Negative for apnea, cough, choking, chest tightness, shortness of breath, wheezing and stridor.   Cardiovascular: Negative for chest pain, palpitations and leg swelling.  Gastrointestinal: Negative for nausea, vomiting, abdominal pain, constipation, blood in stool and abdominal distention.  Genitourinary: Negative for dysuria, urgency, frequency, hematuria, flank pain, decreased urine volume and difficulty urinating.  Musculoskeletal: Negative for myalgias, back pain, joint swelling, arthralgias and gait problem.  Skin: Negative for color change, pallor and rash.  Neurological: Positive for headaches. Negative for dizziness, tremors, seizures, syncope, speech difficulty, weakness, light-headedness and numbness.  Hematological: Negative for adenopathy. Does not bruise/bleed easily.  Psychiatric/Behavioral: Negative for confusion, sleep disturbance and agitation. The patient is not nervous/anxious.        Objective:   Physical Exam  Ambulatory healthy appearing in no acute distress.  wt 208 > 207 November 10, 2008 > 205 June 03, 2009 >   > 201 11/08/2010 > 02/21/2011  207 HEENT: nl dentition, turbinates, and  orophanx. Nl external ear canals without cough reflex  Neck without JVD/Nodes/TM normal carotids without bruits  Lungs clear to A and P  bilaterally without cough on insp or exp maneuvers  RRR no s3 or murmur or increase in P2 no displacement PMI , occ premature beats noted  Abd soft and benign with nl excursion in the supine position. No bruits or organomegaly  Ext warm without calf tenderness, cyanosis clubbing or edema symmetric pedal pulses  Skin warm and dry without lesions except for slight asymmetric freckles lower trunk MS nl gait, no joint restrictions GU/ Rectal [name] Neuro nl affect, sensorium, no motor or cerebellar deficits   CXR  02/21/2011 :  No active lung disease.      Assessment & Plan:

## 2011-02-22 ENCOUNTER — Telehealth: Payer: Self-pay | Admitting: Internal Medicine

## 2011-02-22 ENCOUNTER — Other Ambulatory Visit: Payer: Self-pay | Admitting: Internal Medicine

## 2011-02-22 NOTE — Progress Notes (Signed)
Quick Note:  Spoke with pt and notified of results per Dr. [name]. Pt verbalized understanding and denied any questions.  ______ 

## 2011-02-22 NOTE — Telephone Encounter (Signed)
Spoke with pt and notified of results per Dr. [name]. Pt verbalized understanding and denied any questions. 

## 2011-02-23 ENCOUNTER — Telehealth: Payer: Self-pay | Admitting: *Deleted

## 2011-02-23 ENCOUNTER — Encounter: Payer: Self-pay | Admitting: Internal Medicine

## 2011-02-23 NOTE — Assessment & Plan Note (Signed)
Will need to be reminded again re colonoscopy recs

## 2011-02-23 NOTE — Telephone Encounter (Signed)
LMTCB

## 2011-02-23 NOTE — Telephone Encounter (Signed)
Called and spoke with pt. I advised that he is overdue for colonoscopy and should call for appt with Dr Juanda Chance to have this scheduled. Pt verbalized understanding. States that he is aware of this already and it is on his "to do list" and he has already d/w his wife getting this set up since she will have to drive him.

## 2011-02-23 NOTE — Telephone Encounter (Signed)
Message copied by Christen Butter on Fri Feb 23, 2011  1:13 PM ------      Message from: Sandrea Hughs B      Created: Fri Feb 23, 2011  8:25 AM       Need to call to remind overdue for colonoscopy (letter sent in May 2012)            Change this to a chart note when you confirm you received the letter and verbal advice to f/u with Dr [name]

## 2011-02-23 NOTE — Assessment & Plan Note (Signed)
Ok to change to verapamil though risk more bradycardia so needs to self titrate

## 2011-02-23 NOTE — Telephone Encounter (Signed)
Returning Leslie's call can be reached at [phone].Raylene Everts

## 2011-03-16 ENCOUNTER — Other Ambulatory Visit: Payer: Self-pay | Admitting: Internal Medicine

## 2011-04-21 ENCOUNTER — Other Ambulatory Visit: Payer: Self-pay | Admitting: Internal Medicine

## 2011-04-27 ENCOUNTER — Ambulatory Visit: Payer: BC Managed Care – PPO | Admitting: Internal Medicine

## 2011-04-30 MED ORDER — TRIAMTERENE-HCTZ 37.5-25 MG PO TABS: 1.0000 | ORAL_TABLET | Freq: Every day | ORAL | Status: DC

## 2011-05-18 ENCOUNTER — Other Ambulatory Visit: Payer: Self-pay | Admitting: Internal Medicine

## 2011-09-17 ENCOUNTER — Other Ambulatory Visit: Payer: Self-pay | Admitting: Internal Medicine

## 2011-11-14 ENCOUNTER — Other Ambulatory Visit: Payer: Self-pay | Admitting: Internal Medicine

## 2012-02-13 ENCOUNTER — Other Ambulatory Visit: Payer: Self-pay | Admitting: Internal Medicine

## 2012-02-27 ENCOUNTER — Other Ambulatory Visit: Payer: Self-pay | Admitting: Internal Medicine

## 2012-03-02 ENCOUNTER — Other Ambulatory Visit: Payer: Self-pay | Admitting: Internal Medicine

## 2012-03-20 ENCOUNTER — Ambulatory Visit (INDEPENDENT_AMBULATORY_CARE_PROVIDER_SITE_OTHER): Payer: BC Managed Care – PPO | Admitting: Internal Medicine

## 2012-03-20 ENCOUNTER — Encounter: Payer: Self-pay | Admitting: Internal Medicine

## 2012-03-20 ENCOUNTER — Ambulatory Visit (INDEPENDENT_AMBULATORY_CARE_PROVIDER_SITE_OTHER)
Admission: RE | Admit: 2012-03-20 | Discharge: 2012-03-20 | Disposition: A | Discharge status: Home / Self Care | Payer: BC Managed Care – PPO | Source: Ambulatory Visit | Attending: Internal Medicine | Admitting: Internal Medicine

## 2012-03-20 ENCOUNTER — Other Ambulatory Visit (INDEPENDENT_AMBULATORY_CARE_PROVIDER_SITE_OTHER): Payer: BC Managed Care – PPO

## 2012-03-20 VITALS — BP 128/80 | HR 44 | Temp 97.3°F | Ht 76.0 in | Wt 216.4 lb

## 2012-03-20 DIAGNOSIS — Z0000 Encounter for general adult medical examination without abnormal findings: Secondary | ICD-10-CM

## 2012-03-20 DIAGNOSIS — I4949 Other premature depolarization: Secondary | ICD-10-CM

## 2012-03-20 DIAGNOSIS — I10 Essential (primary) hypertension: Secondary | ICD-10-CM

## 2012-03-20 DIAGNOSIS — K5732 Diverticulitis of large intestine without perforation or abscess without bleeding: Secondary | ICD-10-CM

## 2012-03-20 LAB — BASIC METABOLIC PANEL
CO2: 27 mEq/L (ref 19–32)
Calcium: 9.1 mg/dL (ref 8.4–10.5)
Chloride: 99 mEq/L (ref 96–112)
Glucose, Bld: 104 mg/dL — ABNORMAL HIGH (ref 70–99)
Potassium: 4 mEq/L (ref 3.5–5.1)
Sodium: 135 mEq/L (ref 135–145)

## 2012-03-20 LAB — URINALYSIS
Bilirubin Urine: NEGATIVE
Ketones, ur: NEGATIVE
Leukocytes, UA: NEGATIVE
Nitrite: NEGATIVE
Specific Gravity, Urine: >=1.03 (ref 1.000–1.030)
Total Protein, Urine: NEGATIVE
pH: 5.5 (ref 5.0–8.0)

## 2012-03-20 LAB — CBC WITH DIFFERENTIAL/PLATELET
Basophils Absolute: 0.1 10*3/uL (ref 0.0–0.1)
Basophils Relative: 0.8 % (ref 0.0–3.0)
Eosinophils Absolute: 0.3 10*3/uL (ref 0.0–0.7)
HCT: 51.5 % (ref 39.0–52.0)
Hemoglobin: 16.9 g/dL (ref 13.0–17.0)
Lymphs Abs: 1.7 10*3/uL (ref 0.7–4.0)
MCHC: 32.9 g/dL (ref 30.0–36.0)
MCV: 87.6 fl (ref 78.0–100.0)
Monocytes Absolute: 0.8 10*3/uL (ref 0.1–1.0)
Neutro Abs: 4.9 10*3/uL (ref 1.4–7.7)
RDW: 14.2 % (ref 11.5–14.6)

## 2012-03-20 LAB — LIPID PANEL
Cholesterol: 196 mg/dL (ref 0–200)
HDL: 60.1 mg/dL (ref >39.00)
VLDL: 19.6 mg/dL (ref 0.0–40.0)

## 2012-03-20 LAB — HEPATIC FUNCTION PANEL
Albumin: 4 g/dL (ref 3.5–5.2)
Total Protein: 7.2 g/dL (ref 6.0–8.3)

## 2012-03-20 NOTE — Progress Notes (Signed)
Subjective:    Patient ID: [name], male    DOB: 01-Nov-1950 .   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  06/2008 One episode syncope eval by Dr Rosette Reveal > reduce toprol by half and leave off dexadrin  November 10, 2008 cpx no aerobics but no limitations and no presyncope. no change in rx.  January 16, 2010 cpx no c/o tia, claudication/sob. > no change rx  11/08/2010 f/u ov/[name] cc intermittent hbp and sense of palpitations on toprol 25 has appt with Ladona Ridgel 8/17. Good ex tol, does better when walks regularly and no presyncope ha, tia or claudication. Added back maxzide 25 daily and no better    02/21/2011 f/u ov/[name] stopped the norvasc, started verapamil due to ha's and bp running lower. Minimal pm ha's no nausea or neuro co's rec Change verapamil to 120 mg twice daily but increase to 4 x daily for headaches or blood pressure control Don't take the maxzide when you intake of food and water are not normal   03/20/2012 f/u ov/[name] for comprehensive eval, no longer exercising but feels fine. No tia claudication or sob.  Sleeping ok without nocturnal  or early am exacerbation  of respiratory  C/o's - Also denies any obvious fluctuation of symptoms with weather or environmental changes or other aggravating or alleviating factors except as outlined above   ROS  The following are not active complaints unless bolded sore throat, dysphagia, dental problems, itching, sneezing,  nasal congestion or excess/ purulent secretions, ear ache,   fever, chills, sweats, unintended wt loss, pleuritic or exertional cp, hemoptysis,  orthopnea pnd or leg swelling, presyncope, palpitations, heartburn, abdominal pain, anorexia, nausea, vomiting, diarrhea  or change in bowel or urinary habits, change in stools or urine, dysuria,hematuria,  rash, arthralgias, visual complaints, headache, numbness  weakness or ataxia or problems with walking or coordination,  change in mood/affect or memory.          Past Medical History:  HYPERTENSION (ICD-401.9)  [name]  SYNCOPE (ICD-780.2)  DIVERTICULITIS OF COLON (ICD-562.11)..........................................................Marland KitchenLina Sar  - colonoscopy 07/11/2000 > reminder letter sent 08/03/10> reminded 03/20/2012  Health Maintenance...............................................................................................Marland KitchenWert  - DT November 10, 2008  - CPX  03/20/2012  NEPHROLITHIASIS/BPH......................................................................................[name] Dr. [name]  - prostatitis with noncaseating granulomatous inflammation July 2000 negative IPPD  ADULT ADD.........................................................................................................Marland KitchenDr [name]    Family History:  HBP/ kidney failure father  IHD with cabg at 39 mother  one younger sister healthy no problems    Social History:  The patient lives at home with his wife. 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 He drinks 3  glasses of wine per week.  never smoker         Objective:   Physical Exam  Ambulatory healthy appearing in no acute distress.  wt 208 > 207 November 10, 2008 >  > 02/21/2011  207> 03/20/2012  216 HEENT: nl dentition, turbinates, and orophanx. Nl external ear canals without cough reflex  Neck without JVD/Nodes/TM normal carotids without bruits  Lungs clear to A and P bilaterally without cough on insp or exp maneuvers  RRR no s3 or murmur or increase in P2 no displacement PMI , occ premature beats noted  Abd soft and benign with nl excursion in the supine position. No bruits or organomegaly  Ext warm without calf tenderness, cyanosis clubbing or edema symmetric pedal pulses  Skin warm and dry without lesions except for slight  asymmetric freckles lower trunk MS nl gait, no joint restrictions GU/ Rectal [name] Neuro nl affect, sensorium, no motor or cerebellar deficits   CXR  03/20/2012 :        Assessment & Plan:

## 2012-03-20 NOTE — Progress Notes (Signed)
Quick Note:  Spoke with pt and notified of results per Dr. [name]. Pt verbalized understanding and denied any questions.  ______ 

## 2012-03-20 NOTE — Assessment & Plan Note (Signed)
Continues without symptoms so as per Dr Lubertha Basque last note 1 year ago > rec no change but be aware to look for increase freq / symptoms with ex and if this happens > Re-eval per Dt Taylor's last recs

## 2012-03-20 NOTE — Assessment & Plan Note (Signed)
Adequate control on present rx, reviewed  

## 2012-03-20 NOTE — Assessment & Plan Note (Signed)
-   Reminder letter sent 07/2010 and again 03/20/2012

## 2012-03-20 NOTE — Patient Instructions (Addendum)
Please remember to go to the lab and x-ray department downstairs for your tests - we will call you with the results when they are available.    Call Dr Regino Schultze office colonoscopy early this year  Weight control is simply a matter of calorie balance which needs to be tilted in your favor by eating less and exercising more.  To get the most out of exercise, you need to be continuously aware that you are short of breath, but never out of breath, for 30 minutes daily. As you improve, it will actually be easier for you to do the same amount of exercise  in  30 minutes so always push to the level where you are short of breath not out of breath.   Return in one year, sooner if needed for full physical

## 2012-04-05 ENCOUNTER — Other Ambulatory Visit: Payer: Self-pay | Admitting: Internal Medicine

## 2012-06-02 ENCOUNTER — Telehealth: Payer: Self-pay | Admitting: Internal Medicine

## 2012-06-03 MED ORDER — TRIAMTERENE-HCTZ 37.5-25 MG PO TABS: 1.0000 | ORAL_TABLET | Freq: Every day | ORAL | Status: DC

## 2012-06-03 NOTE — Telephone Encounter (Signed)
Patient last seen 12.26.13 by MW for cpx Follow up in 1 year Rx sent to [facility] Patient is aware Will sign off

## 2012-12-15 ENCOUNTER — Encounter: Payer: Self-pay | Admitting: Internal Medicine

## 2013-02-11 ENCOUNTER — Ambulatory Visit (AMBULATORY_SURGERY_CENTER): Payer: Self-pay | Admitting: *Deleted

## 2013-02-11 VITALS — Ht 76.0 in | Wt 211.0 lb

## 2013-02-11 DIAGNOSIS — Z1211 Encounter for screening for malignant neoplasm of colon: Secondary | ICD-10-CM

## 2013-02-11 MED ORDER — MOVIPREP 100 G PO SOLR: ORAL | Status: DC

## 2013-02-11 NOTE — Progress Notes (Signed)
No allergies to eggs or soy. No problems with anesthesia.  

## 2013-02-12 ENCOUNTER — Encounter: Payer: Self-pay | Admitting: Internal Medicine

## 2013-03-06 ENCOUNTER — Encounter: Payer: Self-pay | Admitting: Internal Medicine

## 2013-03-06 ENCOUNTER — Ambulatory Visit (AMBULATORY_SURGERY_CENTER): Payer: BC Managed Care – PPO | Admitting: Internal Medicine

## 2013-03-06 VITALS — BP 129/88 | HR 63 | Temp 96.6°F | Resp 15 | Ht 76.0 in | Wt 211.0 lb

## 2013-03-06 DIAGNOSIS — Z1211 Encounter for screening for malignant neoplasm of colon: Secondary | ICD-10-CM

## 2013-03-06 MED ORDER — SODIUM CHLORIDE 0.9 % IV SOLN: 500.0000 mL | INTRAVENOUS | Status: DC

## 2013-03-06 NOTE — Progress Notes (Signed)
Patient did not experience any of the following events: a burn prior to discharge; a fall within the facility; wrong site/side/patient/procedure/implant event; or a hospital transfer or hospital admission upon discharge from the facility. (G8907) Patient did not have preoperative order for IV antibiotic SSI prophylaxis. ([id number])  

## 2013-03-06 NOTE — Progress Notes (Signed)
Procedure ends, to recovery, report given and VSS. 

## 2013-03-06 NOTE — Patient Instructions (Signed)

## 2013-03-06 NOTE — Op Note (Signed)
[facility] [address] [place], [address]   COLONOSCOPY PROCEDURE REPORT  PATIENT: [name], [name]  MR#: [id number] BIRTHDATE: 02/21/1951 , 62  yrs. old GENDER: Male ENDOSCOPIST: Hart Carwin, MD REFERRED XB:[id number] Denice Paradise, M.D. PROCEDURE DATE:  03/06/2013 PROCEDURE:   Colonoscopy, screening First Screening Colonoscopy - Avg.  risk and is 50 yrs.  old or older - No.  Prior Negative Screening - Now for repeat screening. 10 or more years since last screening  History of Adenoma - Now for follow-up colonoscopy & has been > or = to 3 yrs.  N/A  Polyps Removed Today? No.  Recommend repeat exam, <10 yrs? No. ASA CLASS:   Class II INDICATIONS:Average risk patient for colon cancer and llast colonoscopy in April 2002 showed mild diverticulosis of the left colon. MEDICATIONS: MAC sedation, administered by CRNA and Propofol (Diprivan) 230 mg IV  DESCRIPTION OF PROCEDURE:   After the risks benefits and alternatives of the procedure were thoroughly explained, informed consent was obtained.  A digital rectal exam revealed no abnormalities of the rectum.   The LB [id number]-H190 [id number]  endoscope was introduced through the anus and advanced to the cecum, which was identified by both the appendix and ileocecal valve. No adverse events experienced.   The quality of the prep was good, using MoviPrep  The instrument was then slowly withdrawn as the colon was fully examined.      COLON FINDINGS: Mild diverticulosis was noted in the descending colon.  Retroflexed views revealed no abnormalities. The time to cecum=4 minutes 39 seconds.  Withdrawal time=6 minutes 53 seconds. The scope was withdrawn and the procedure completed. COMPLICATIONS: There were no complications.  ENDOSCOPIC IMPRESSION: Mild diverticulosis was noted in the descending colon  RECOMMENDATIONS: 1.  High fiber diet 2.  recall colonoscopy 10 years   eSigned:  Hart Carwin, MD 03/06/2013 8:34  AM   cc:   PATIENT NAME:  [name], [name] MR#: [id number]

## 2013-03-09 ENCOUNTER — Telehealth: Payer: Self-pay

## 2013-03-09 NOTE — Telephone Encounter (Signed)
  Follow up Call-  Call back number 03/06/2013  Post procedure Call Back phone  # [phone]  Permission to leave phone message Yes     Patient questions:  Do you have a fever, pain , or abdominal swelling? no Pain Score  0 *  Have you tolerated food without any problems? yes  Have you been able to return to your normal activities? yes  Do you have any questions about your discharge instructions: Diet   no Medications  no Follow up visit  no  Do you have questions or concerns about your Care? no  Actions: * If pain score is 4 or above: No action needed, pain <4.  No problems per the pt. Maw

## 2013-04-09 ENCOUNTER — Encounter: Payer: Self-pay | Admitting: Internal Medicine

## 2013-04-09 ENCOUNTER — Encounter (INDEPENDENT_AMBULATORY_CARE_PROVIDER_SITE_OTHER): Payer: Self-pay

## 2013-04-09 ENCOUNTER — Ambulatory Visit (INDEPENDENT_AMBULATORY_CARE_PROVIDER_SITE_OTHER): Payer: BC Managed Care – PPO | Admitting: Internal Medicine

## 2013-04-09 ENCOUNTER — Ambulatory Visit (INDEPENDENT_AMBULATORY_CARE_PROVIDER_SITE_OTHER)
Admission: RE | Admit: 2013-04-09 | Discharge: 2013-04-09 | Disposition: A | Discharge status: Home / Self Care | Payer: BC Managed Care – PPO | Source: Ambulatory Visit | Attending: Internal Medicine | Admitting: Internal Medicine

## 2013-04-09 VITALS — BP 132/98 | HR 88 | Temp 97.3°F | Ht 76.0 in | Wt 214.8 lb

## 2013-04-09 DIAGNOSIS — I10 Essential (primary) hypertension: Secondary | ICD-10-CM

## 2013-04-09 DIAGNOSIS — K5730 Diverticulosis of large intestine without perforation or abscess without bleeding: Secondary | ICD-10-CM

## 2013-04-09 DIAGNOSIS — K5790 Diverticulosis of intestine, part unspecified, without perforation or abscess without bleeding: Secondary | ICD-10-CM

## 2013-04-09 DIAGNOSIS — I4949 Other premature depolarization: Secondary | ICD-10-CM

## 2013-04-09 DIAGNOSIS — Z0000 Encounter for general adult medical examination without abnormal findings: Secondary | ICD-10-CM

## 2013-04-09 NOTE — Progress Notes (Signed)
Subjective:    Patient ID: [name], male    DOB: 02/28/1951 .   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   History of Present Illness  06/2008 One episode syncope eval by Dr Rosette RevealG Taylor > reduce toprol by half and leave off dexadrin  November 10, 2008 cpx no aerobics but no limitations and no presyncope. no change in rx.   January 16, 2010 cpx no c/o tia, claudication/sob. > no change rx   11/08/2010 f/u ov/Izabella Marcantel cc intermittent hbp and sense of palpitations on toprol 25 has appt with Ladona Ridgelaylor 8/17. Good ex tol, does better when walks regularly and no presyncope ha, tia or claudication. Added back maxzide 25 daily and no better    02/21/2011 f/u ov/Christoper Bushey stopped the norvasc, started verapamil due to ha's and bp running lower. 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 Annual Exam    Pt states doing well and denies any co's today. Not fasting.    no am ha, tia, claudication, sob  No obvious day to day or daytime variabilty or assoc chronic cough or cp or chest tightness, subjective wheeze overt sinus or hb symptoms. No unusual exp hx or h/o childhood pna/ asthma or knowledge of premature birth.  Sleeping ok without nocturnal  or early am exacerbation  of respiratory  c/o's or need for noct saba. Also denies any obvious fluctuation of symptoms with weather or environmental changes or other aggravating or alleviating factors except as outlined above   Current Medications, Allergies, Complete Past Medical History, Past Surgical History, Family History, and Social  History were reviewed in [name].  ROS  The following are not active complaints unless bolded sore throat, dysphagia, dental problems, itching, sneezing,  nasal congestion or excess/ purulent secretions, ear ache,   fever, chills, sweats, unintended wt loss, pleuritic or exertional cp, hemoptysis,  orthopnea pnd or leg swelling, presyncope, palpitations, heartburn, abdominal pain, anorexia, nausea, vomiting, diarrhea  or change in bowel or urinary habits, change in stools or urine, dysuria,hematuria,  rash, arthralgias, visual complaints, headache, numbness weakness or ataxia or problems with walking or coordination,  change in mood/affect or memory.                 Past Medical History:  HYPERTENSION (ICD-401.9)  [name]  SYNCOPE (ICD-780.2)  DIVERTICULITIS OF COLON (ICD-562.11)..........................................................[name].[name]  - colonoscopy 07/11/2000 > reminder letter sent 08/03/10> reminded 03/20/2012  Health Maintenance...............................................................................................[name].Carola Viramontes  - DT November 10, 2008  - CPX  04/09/2013  NEPHROLITHIASIS/BPH......................................................................................[name]. [name]  - prostatitis with noncaseating granulomatous inflammation July 2000 negative IPPD  ADULT ADD.........................................................................................................[name].[name]    Family History:  HBP/ kidney failure father  IHD with cabg at 6270 mother  one younger sister healthy no problems    Social History:  The patient lives at home with his wife. 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 He drinks 3  glasses of wine per week.  never smoker  Objective:   Physical Exam  Ambulatory healthy appearing in no acute distress.   wt 208 > 207 November 10, 2008 >  > 02/21/2011  207> 03/20/2012  216 >  04/09/2013  215   HEENT: nl dentition, turbinates, and orophanx. Nl external ear canals without cough reflex  Neck without JVD/Nodes/TM normal carotids without bruits  Lungs clear to A and P bilaterally without cough on insp or exp maneuvers  RRR no s3 or murmur or increase in P2 no displacement PMI , occ premature beats noted  Abd soft and benign with nl excursion in the supine position. No bruits or organomegaly  Ext warm without calf tenderness, cyanosis clubbing or edema symmetric pedal pulses  Skin warm and dry without lesions except for slight asymmetric freckles lower trunk MS nl gait, no joint restrictions GU/ Rectal [name] Neuro nl affect, sensorium, no motor or cerebellar deficits   CXR  04/09/2013  No active cardiopulmonary disease.  04/09/13 EKG  nsr with nsstw changes      Assessment & Plan:

## 2013-04-09 NOTE — Progress Notes (Signed)
Quick Note:  Pt aware ______ 

## 2013-04-09 NOTE — Patient Instructions (Addendum)
Please remember to go to the x-ray department downstairs for your tests - we will call you with the results when they are available. (ok to return fasting for the bloodwork)  If blood pressure above 140/90 and pulse over 65 ok to take an extra half a toprol

## 2013-04-09 NOTE — Progress Notes (Signed)
Quick Note:  Left detailed msg on cell ______

## 2013-04-10 ENCOUNTER — Telehealth: Payer: Self-pay | Admitting: Internal Medicine

## 2013-04-10 ENCOUNTER — Other Ambulatory Visit (INDEPENDENT_AMBULATORY_CARE_PROVIDER_SITE_OTHER): Payer: BC Managed Care – PPO

## 2013-04-10 DIAGNOSIS — I4949 Other premature depolarization: Secondary | ICD-10-CM

## 2013-04-10 DIAGNOSIS — I10 Essential (primary) hypertension: Secondary | ICD-10-CM

## 2013-04-10 DIAGNOSIS — Z0000 Encounter for general adult medical examination without abnormal findings: Secondary | ICD-10-CM | POA: Insufficient documentation

## 2013-04-10 LAB — URINALYSIS
BILIRUBIN URINE: NEGATIVE
HGB URINE DIPSTICK: NEGATIVE
KETONES UR: 15 — AB
LEUKOCYTES UA: NEGATIVE
NITRITE: NEGATIVE
PH: 5.5 (ref 5.0–8.0)
Specific Gravity, Urine: 1.025 (ref 1.000–1.030)
Total Protein, Urine: NEGATIVE
UROBILINOGEN UA: 0.2 (ref 0.0–1.0)
Urine Glucose: NEGATIVE

## 2013-04-10 LAB — LIPID PANEL
CHOL/HDL RATIO: 3
Cholesterol: 195 mg/dL (ref 0–200)
HDL: 58.4 mg/dL (ref >39.00)
LDL Cholesterol: 120 mg/dL — ABNORMAL HIGH (ref 0–99)
Triglycerides: 85 mg/dL (ref 0.0–149.0)
VLDL: 17 mg/dL (ref 0.0–40.0)

## 2013-04-10 LAB — BASIC METABOLIC PANEL
BUN: 27 mg/dL — AB (ref 6–23)
CALCIUM: 9.2 mg/dL (ref 8.4–10.5)
CO2: 26 mEq/L (ref 19–32)
CREATININE: 1.4 mg/dL (ref 0.4–1.5)
Chloride: 101 mEq/L (ref 96–112)
GFR: 55.05 mL/min — AB (ref >60.00)
GLUCOSE: 81 mg/dL (ref 70–99)
POTASSIUM: 3.8 meq/L (ref 3.5–5.1)
Sodium: 137 mEq/L (ref 135–145)

## 2013-04-10 LAB — CBC WITH DIFFERENTIAL/PLATELET
BASOS ABS: 0.1 10*3/uL (ref 0.0–0.1)
Basophils Relative: 0.6 % (ref 0.0–3.0)
EOS PCT: 3.2 % (ref 0.0–5.0)
Eosinophils Absolute: 0.3 10*3/uL (ref 0.0–0.7)
HEMATOCRIT: 50.2 % (ref 39.0–52.0)
HEMOGLOBIN: 17.3 g/dL — AB (ref 13.0–17.0)
LYMPHS PCT: 17.9 % (ref 12.0–46.0)
Lymphs Abs: 1.5 10*3/uL (ref 0.7–4.0)
MCHC: 34.5 g/dL (ref 30.0–36.0)
MCV: 86 fl (ref 78.0–100.0)
MONOS PCT: 10.8 % (ref 3.0–12.0)
Monocytes Absolute: 0.9 10*3/uL (ref 0.1–1.0)
NEUTROS ABS: 5.5 10*3/uL (ref 1.4–7.7)
Neutrophils Relative %: 67.5 % (ref 43.0–77.0)
Platelets: 296 10*3/uL (ref 150.0–400.0)
RBC: 5.84 Mil/uL — ABNORMAL HIGH (ref 4.22–5.81)
RDW: 13.8 % (ref 11.5–14.6)
WBC: 8.1 10*3/uL (ref 4.5–10.5)

## 2013-04-10 LAB — TSH: TSH: 0.6 u[IU]/mL (ref 0.35–5.50)

## 2013-04-10 NOTE — Assessment & Plan Note (Signed)
Lab Results  Component Value Date   CREATININE 1.4 04/10/2013   CREATININE 1.4 03/20/2012   CREATININE 1.4 02/21/2011     Not  Adequate control on present rx, reviewed > he is reluctant due to past syncope to make any changes but note not adequately B Blocked > rec adding another 25 mg metaprolol prn

## 2013-04-10 NOTE — Telephone Encounter (Signed)
Result Note     Call patient : Studies are unremarkable, no change in recs     I spoke with patient about results and he verbalized understanding and had no questions 

## 2013-04-10 NOTE — Assessment & Plan Note (Addendum)
-   13k pvc's in 24 h by holter 2012 with f/u ov 02/20/12 with Dr Ladona Ridgelaylor rec> no change rx unless symptoms Adequate control on present rx, reviewed > no change in rx needed

## 2013-05-12 ENCOUNTER — Other Ambulatory Visit: Payer: Self-pay | Admitting: Internal Medicine

## 2014-01-18 ENCOUNTER — Other Ambulatory Visit: Payer: Self-pay | Admitting: Internal Medicine

## 2014-04-19 ENCOUNTER — Other Ambulatory Visit: Payer: Self-pay | Admitting: Internal Medicine

## 2014-07-18 ENCOUNTER — Other Ambulatory Visit: Payer: Self-pay | Admitting: Internal Medicine

## 2014-09-15 ENCOUNTER — Other Ambulatory Visit: Payer: Self-pay | Admitting: Internal Medicine

## 2014-10-04 ENCOUNTER — Telehealth: Payer: Self-pay | Admitting: Internal Medicine

## 2014-10-04 MED ORDER — TRIAMTERENE-HCTZ 37.5-25 MG PO CAPS: 1.0000 | ORAL_CAPSULE | Freq: Every day | ORAL | Status: DC

## 2014-10-04 MED ORDER — AMLODIPINE BESYLATE 5 MG PO TABS
5.0000 mg | ORAL_TABLET | Freq: Every day | ORAL | Status: DC
Start: 2014-10-04 — End: 2014-12-13

## 2014-10-04 MED ORDER — METOPROLOL SUCCINATE ER 50 MG PO TB24: 50.0000 mg | ORAL_TABLET | Freq: Every day | ORAL | Status: DC

## 2014-10-04 MED ORDER — GUANFACINE HCL 1 MG PO TABS: 1.0000 mg | ORAL_TABLET | Freq: Every day | ORAL | Status: DC

## 2014-10-04 NOTE — Telephone Encounter (Signed)
Spoke with pt. States he needs refills on Toprol, Amlodipine, Tenex and Maxide. These have been sent in. Advised that he needed to keep his appointment with MW on 10/12/14. Nothing further was needed.

## 2014-10-12 ENCOUNTER — Encounter: Payer: Self-pay | Admitting: Internal Medicine

## 2014-10-12 ENCOUNTER — Other Ambulatory Visit (INDEPENDENT_AMBULATORY_CARE_PROVIDER_SITE_OTHER): Payer: BLUE CROSS/BLUE SHIELD

## 2014-10-12 ENCOUNTER — Ambulatory Visit (INDEPENDENT_AMBULATORY_CARE_PROVIDER_SITE_OTHER): Payer: BLUE CROSS/BLUE SHIELD | Admitting: Internal Medicine

## 2014-10-12 ENCOUNTER — Encounter (INDEPENDENT_AMBULATORY_CARE_PROVIDER_SITE_OTHER): Payer: Self-pay

## 2014-10-12 ENCOUNTER — Ambulatory Visit (INDEPENDENT_AMBULATORY_CARE_PROVIDER_SITE_OTHER)
Admission: RE | Admit: 2014-10-12 | Discharge: 2014-10-12 | Disposition: A | Discharge status: Home / Self Care | Payer: BLUE CROSS/BLUE SHIELD | Source: Ambulatory Visit | Attending: Internal Medicine | Admitting: Internal Medicine

## 2014-10-12 VITALS — BP 108/70 | HR 66 | Ht 76.0 in | Wt 215.0 lb

## 2014-10-12 DIAGNOSIS — Z23 Encounter for immunization: Secondary | ICD-10-CM | POA: Diagnosis not present

## 2014-10-12 DIAGNOSIS — Z0000 Encounter for general adult medical examination without abnormal findings: Secondary | ICD-10-CM

## 2014-10-12 DIAGNOSIS — N400 Enlarged prostate without lower urinary tract symptoms: Secondary | ICD-10-CM | POA: Diagnosis not present

## 2014-10-12 DIAGNOSIS — I10 Essential (primary) hypertension: Secondary | ICD-10-CM | POA: Diagnosis not present

## 2014-10-12 DIAGNOSIS — E785 Hyperlipidemia, unspecified: Secondary | ICD-10-CM

## 2014-10-12 LAB — BASIC METABOLIC PANEL
BUN: 19 mg/dL (ref 6–23)
CALCIUM: 9.5 mg/dL (ref 8.4–10.5)
CHLORIDE: 102 meq/L (ref 96–112)
CO2: 29 mEq/L (ref 19–32)
Creatinine, Ser: 1.23 mg/dL (ref 0.40–1.50)
GFR: 62.57 mL/min (ref >60.00)
Glucose, Bld: 106 mg/dL — ABNORMAL HIGH (ref 70–99)
Potassium: 4 mEq/L (ref 3.5–5.1)
Sodium: 138 mEq/L (ref 135–145)

## 2014-10-12 LAB — URINALYSIS
Bilirubin Urine: NEGATIVE
Hgb urine dipstick: NEGATIVE
KETONES UR: NEGATIVE
LEUKOCYTES UA: NEGATIVE
Nitrite: NEGATIVE
Specific Gravity, Urine: 1.02 (ref 1.000–1.030)
Total Protein, Urine: NEGATIVE
URINE GLUCOSE: NEGATIVE
UROBILINOGEN UA: 0.2 (ref 0.0–1.0)
pH: 6.5 (ref 5.0–8.0)

## 2014-10-12 LAB — CBC WITH DIFFERENTIAL/PLATELET
BASOS ABS: 0.1 10*3/uL (ref 0.0–0.1)
Basophils Relative: 0.9 % (ref 0.0–3.0)
EOS ABS: 0.4 10*3/uL (ref 0.0–0.7)
Eosinophils Relative: 4.7 % (ref 0.0–5.0)
HCT: 47.5 % (ref 39.0–52.0)
HEMOGLOBIN: 16.2 g/dL (ref 13.0–17.0)
Lymphocytes Relative: 21 % (ref 12.0–46.0)
Lymphs Abs: 1.6 10*3/uL (ref 0.7–4.0)
MCHC: 34.1 g/dL (ref 30.0–36.0)
MCV: 85.7 fl (ref 78.0–100.0)
Monocytes Absolute: 0.7 10*3/uL (ref 0.1–1.0)
Monocytes Relative: 9.3 % (ref 3.0–12.0)
NEUTROS PCT: 64.1 % (ref 43.0–77.0)
Neutro Abs: 4.9 10*3/uL (ref 1.4–7.7)
Platelets: 308 10*3/uL (ref 150.0–400.0)
RBC: 5.55 Mil/uL (ref 4.22–5.81)
RDW: 14.4 % (ref 11.5–15.5)
WBC: 7.7 10*3/uL (ref 4.0–10.5)

## 2014-10-12 LAB — LIPID PANEL
Cholesterol: 195 mg/dL (ref 0–200)
HDL: 67.6 mg/dL (ref >39.00)
LDL CALC: 110 mg/dL — AB (ref 0–99)
NonHDL: 127.4
Total CHOL/HDL Ratio: 3
Triglycerides: 87 mg/dL (ref 0.0–149.0)
VLDL: 17.4 mg/dL (ref 0.0–40.0)

## 2014-10-12 LAB — HEPATIC FUNCTION PANEL
ALT: 15 U/L (ref 0–53)
AST: 16 U/L (ref 0–37)
Albumin: 4.2 g/dL (ref 3.5–5.2)
Alkaline Phosphatase: 68 U/L (ref 39–117)
Bilirubin, Direct: 0.2 mg/dL (ref 0.0–0.3)
TOTAL PROTEIN: 6.8 g/dL (ref 6.0–8.3)
Total Bilirubin: 0.9 mg/dL (ref 0.2–1.2)

## 2014-10-12 LAB — TSH: TSH: 0.59 u[IU]/mL (ref 0.35–4.50)

## 2014-10-12 NOTE — Progress Notes (Signed)
Quick Note:  LMTCB ______ 

## 2014-10-12 NOTE — Progress Notes (Signed)
Subjective:    Patient ID: [name], male    DOB: 01/25/1951 .   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   History of Present Illness  06/2008 One episode syncope eval by [name] > reduce toprol by half and leave off dexadrin  November 10, 2008 cpx no aerobics but no limitations and no presyncope. no change in rx.        10/12/2014 f/u ov/[name] re: cpx/ hbp/ h/o syncope Chief Complaint  Patient presents with  . Annual Exam    Pt is fasting.  He c/o non prod cough for the past couple wks- comes and goes.    cough variable/ when present typically assoc with nasal congestion no purulent sputum or sob with persistent sense of pnds  no am ha, tia, claudication, sob  No obvious day to day or daytime variabilty or assoc  cp or chest tightness, subjective wheeze overt sinus or hb symptoms. No unusual exp hx or h/o childhood pna/ asthma or knowledge of premature birth.  Sleeping ok without nocturnal  or early am exacerbation  of respiratory  c/o's or need for noct saba. Also denies any obvious fluctuation of symptoms with weather or environmental changes or other aggravating or alleviating factors except as outlined above   Current Medications, Allergies, Complete Past Medical History, Past Surgical History, Family History, and Social History were reviewed in [name].  ROS  The following are not active complaints unless bolded sore throat, dysphagia, dental problems, itching, sneezing,  nasal congestion or excess/ purulent secretions, ear ache,   fever, chills, sweats, unintended wt loss, pleuritic or exertional cp, hemoptysis,  orthopnea pnd or leg swelling, presyncope, palpitations, heartburn, abdominal pain, anorexia, nausea, vomiting, diarrhea  or change in bowel or urinary habits, change in stools or urine,  dysuria,hematuria,  rash, arthralgias, visual complaints, headache, numbness weakness or ataxia or problems with walking or coordination,  change in mood/affect or memory.                 Past Medical History:  HYPERTENSION (ICD-401.9)  [name]  SYNCOPE (ICD-780.2)  DIVERTICULITIS OF COLON (ICD-562.11)..........................................................[name].[name]  - colonoscopy 07/11/2000 > reminder letter sent 08/03/10> reminded 03/20/2012  Health Maintenance...............................................................................................[name].[name]  - DT November 10, 2008  -Prevnar 13 10/12/2014  - CPX  10/12/2014  NEPHROLITHIASIS/BPH......................................................................................[name]. [name]  - prostatitis with noncaseating granulomatous inflammation July 2000 negative IPPD  ADULT ADD.........................................................................................................[name].[name]    Family History:  HBP/ kidney failure father  IHD with cabg at 1570 mother  one younger sister healthy no problems    Social History:  The patient lives at home with his wife. 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 He drinks 3  glasses of wine per week.  never smoker         Objective:   Physical Exam  Ambulatory healthy appearing in no acute distress.  Pos nasal tone to voice   wt 208 > 207 November 10, 2008 >  > 02/21/2011  207> 03/20/2012  216 > 04/09/2013  215 > 10/12/2014 215   HEENT: nl dentition, turbinates, and orophanx. Nl external ear canals without cough reflex  Neck without JVD/Nodes/TM normal carotids without bruits  Lungs clear to A and P bilaterally without cough on insp or exp maneuvers  RRR no s3 or murmur or increase  in P2 no displacement PMI , occ premature beats noted  Abd soft and benign with nl excursion in the supine position. No bruits or organomegaly  Ext warm without calf tenderness,  cyanosis clubbing or edema slt asymmetric pedal pulses L stronger than R  Skin warm and dry without lesions except for slight asymmetric freckles lower trunk MS nl gait, no joint restrictions GU/ Rectal [name] Neuro nl affect, sensorium, no motor or cerebellar deficits    I personally reviewed images and agree with radiology impression as follows:  CXR:  10/12/14 No active cardiopulmonary disease.  Labs ordered/ reviewed:  Lab 10/12/14 0934  NA 138  K 4.0  CL 102  CO2 29  BUN 19  CREATININE 1.23  GLUCOSE 106*    Lab 10/12/14 0934  HGB 16.2  HCT 47.5  WBC 7.7  PLT 308.0     Lab Results  Component Value Date   TSH 0.59 10/12/2014                Assessment & Plan:

## 2014-10-12 NOTE — Assessment & Plan Note (Signed)
prevnar due > given  gu HM > Annabell HowellsWrenn

## 2014-10-12 NOTE — Patient Instructions (Signed)
To get the most out of exercise, you need to be continuously aware that you are short of breath, but never out of breath, for 30 minutes  3x weekly. As you improve, it will actually be easier for you to do the same amount of exercise  in  30 minutes so always push to the level where you are short of breath.    Prevnar 13 due today - shingles shot optional  Please remember to go to the lab and x-ray department downstairs for your tests - we will call you with the results when they are available.

## 2014-10-13 ENCOUNTER — Telehealth: Payer: Self-pay | Admitting: Internal Medicine

## 2014-10-13 NOTE — Telephone Encounter (Signed)
Result Note     Call pt: Reviewed cxr and no acute change so no change in recommendations made at ov  EKG:  Result Note     Call patient : Study is unremarkable, no change in recs  Lab results: Result Note     Call patient : Studies are unremarkable, no change in recs   I spoke with patient about results and he verbalized understanding and had no questions

## 2014-10-18 ENCOUNTER — Encounter: Payer: Self-pay | Admitting: Internal Medicine

## 2014-10-18 DIAGNOSIS — E785 Hyperlipidemia, unspecified: Secondary | ICD-10-CM | POA: Insufficient documentation

## 2014-10-18 NOTE — Assessment & Plan Note (Signed)
Pos hbp/ Type A male > rec keep ldl < 130  Lab Results  Component Value Date   CHOL 195 10/12/2014   HDL 67.60 10/12/2014   LDLCALC 110* 10/12/2014   TRIG 87.0 10/12/2014   CHOLHDL 3 10/12/2014     Adequate control on present rx, reviewed > no change in rx needed  = diet/ ex

## 2014-10-18 NOTE — Assessment & Plan Note (Signed)
Adequate control on present rx, reviewed > no change in rx needed   

## 2014-10-18 NOTE — Assessment & Plan Note (Signed)
Followed by  Urology/ Dr [name]  No new symptoms / no sign obst uropathy   Lab Results  Component Value Date   CREATININE 1.23 10/12/2014   CREATININE 1.4 04/10/2013   CREATININE 1.4 03/20/2012

## 2014-12-13 ENCOUNTER — Other Ambulatory Visit: Payer: Self-pay | Admitting: Internal Medicine

## 2014-12-24 ENCOUNTER — Other Ambulatory Visit: Payer: Self-pay | Admitting: Internal Medicine

## 2015-01-11 ENCOUNTER — Ambulatory Visit (INDEPENDENT_AMBULATORY_CARE_PROVIDER_SITE_OTHER): Payer: BLUE CROSS/BLUE SHIELD | Admitting: Adult Health

## 2015-01-11 ENCOUNTER — Encounter: Payer: Self-pay | Admitting: Adult Health

## 2015-01-11 ENCOUNTER — Other Ambulatory Visit: Payer: BLUE CROSS/BLUE SHIELD

## 2015-01-11 VITALS — BP 108/82 | HR 81 | Temp 98.0°F | Ht 76.0 in | Wt 205.0 lb

## 2015-01-11 DIAGNOSIS — R05 Cough: Secondary | ICD-10-CM

## 2015-01-11 DIAGNOSIS — J069 Acute upper respiratory infection, unspecified: Secondary | ICD-10-CM | POA: Diagnosis not present

## 2015-01-11 DIAGNOSIS — R059 Cough, unspecified: Secondary | ICD-10-CM

## 2015-01-11 DIAGNOSIS — J029 Acute pharyngitis, unspecified: Secondary | ICD-10-CM | POA: Diagnosis not present

## 2015-01-11 LAB — BETA STREP SCREEN: Streptococcus, Group A Screen (Direct): NEGATIVE

## 2015-01-11 NOTE — Patient Instructions (Signed)
Saline nasal spray As needed   Claritin or Allegra daily As needed  Drainage .  Delsym As needed  Cough .  Fluids and rest  Tylenol As needed   Strep test today.  Please contact office for sooner follow up if symptoms do not improve or worsen or seek emergency care  follow up Dr. Sherene SiresWert  As planned and As needed

## 2015-01-11 NOTE — Assessment & Plan Note (Signed)
  Appears to have a viral URI /AR Check strep test  Plan  Saline nasal spray As needed   Claritin or Allegra daily As needed  Drainage .  Delsym As needed  Cough .  Fluids and rest  Tylenol As needed   Strep test today.  Please contact office for sooner follow up if symptoms do not improve or worsen or seek emergency care  follow up Dr. Sherene SiresWert  As planned and As needed

## 2015-01-11 NOTE — Progress Notes (Signed)
Quick Note:  LVM for pt to return call ______ 

## 2015-01-11 NOTE — Progress Notes (Signed)
Quick Note:  Called and spoke with pt. Reviewed results and recs. Pt voiced understanding and had no further questions. ______ 

## 2015-01-11 NOTE — Progress Notes (Signed)
Subjective:    Patient ID: [name], male    DOB: Jun 28, 1950 .   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   History of Present Illness  06/2008 One episode syncope eval by Dr Rosette Reveal > reduce toprol by half and leave off dexadrin  November 10, 2008 cpx no aerobics but no limitations and no presyncope. no change in rx.        10/12/2014 f/u ov/[name] re: cpx/ hbp/ h/o syncope Chief Complaint  Patient presents with  . Annual Exam    Pt is fasting.  He c/o non prod cough for the past couple wks- comes and goes.    cough variable/ when present typically assoc with nasal congestion no purulent sputum or sob with persistent sense of pnds  no am ha, tia, claudication, sob >>no changes   01/11/2015 Acute OV :  Pt presents for 2 days of sore throat, dry cough, and sinus congestion since 01/09/15.  No fever or discolored mucus . No calf pain or syncope.  No otc used.  Recently traveled to [place] for vacation. No recent abx use .  Denies fever, SOB, wheezing, nausea or vomitting. No neck stiffness. Or rash .   Current Medications, Allergies, Complete Past Medical History, Past Surgical History, Family History, and Social History were reviewed in [facility] record.  ROS  The following are not active complaints unless bolded sore throat, dysphagia, dental problems, itching, sneezing,  nasal congestion or excess/ purulent secretions, ear ache,   fever, chills, sweats, unintended wt loss, pleuritic or exertional cp, hemoptysis,  orthopnea pnd or leg swelling, presyncope, palpitations, heartburn, abdominal pain, anorexia, nausea, vomiting, diarrhea  or change in bowel or urinary habits, change in stools or urine, dysuria,hematuria,  rash, arthralgias, visual complaints, headache, numbness weakness or ataxia or problems with walking or  coordination,  change in mood/affect or memory.          Past Medical History:  HYPERTENSION (ICD-401.9)  [name]  SYNCOPE (ICD-780.2)  DIVERTICULITIS OF COLON (ICD-562.11)..........................................................Marland KitchenLina Sar  - colonoscopy 07/11/2000 > reminder letter sent 08/03/10> reminded 03/20/2012  Health Maintenance...............................................................................................Marland KitchenWert  - DT November 10, 2008  -Prevnar 13 10/12/2014  - CPX  10/12/2014  NEPHROLITHIASIS/BPH......................................................................................[name] Dr. [name]  - prostatitis with noncaseating granulomatous inflammation July 2000 negative IPPD  ADULT ADD.........................................................................................................Marland KitchenDr [name]    Family History:  HBP/ kidney failure father  IHD with cabg at 68 mother  one younger sister healthy no problems    Social History:  The patient lives at home with his wife. 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 He drinks 3  glasses of wine per week.  never smoker         Objective:   Physical Exam  Ambulatory healthy appearing in no acute distress.   wt 208 > 207 November 10, 2008 >  > 02/21/2011  207> 03/20/2012  216 > 04/09/2013  215 > 10/12/2014 215 > 205 01/11/2015   HEENT: nl dentition, turbinates, and orophanx. Nl external ear canals  Post pharynx without exudate, mild clear drainage  Neck without JVD/Nodes/TM normal carotids without bruits  Lungs clear to A and P bilaterally without cough on insp or exp maneuvers  RRR no s3 or murmur  Abd soft and benign  W/ no  organomegaly  Ext warm without calf tenderness, cyanosis clubbing or edema  Skin warm and dry        CXR:  10/12/14 No active cardiopulmonary disease.     Lab 10/12/14 0934  NA 138  K 4.0  CL 102  CO2 29  BUN 19  CREATININE 1.23  GLUCOSE 106*    Lab  10/12/14 0934  HGB 16.2  HCT 47.5  WBC 7.7  PLT 308.0     Lab Results  Component Value Date   TSH 0.59 10/12/2014                Assessment & Plan:

## 2015-01-11 NOTE — Progress Notes (Signed)
Chart and office note reviewed in detail  > agree with a/p as outlined    

## 2015-04-11 ENCOUNTER — Telehealth: Payer: Self-pay | Admitting: Internal Medicine

## 2015-04-11 NOTE — Telephone Encounter (Signed)
Spoke with pt. States that he is taking Verapamil 120mg  for cluster headaches. This medication is prescribed by [facility] Catal[facility]agan [facility]. He takes this as needed and started 3 days ago. Since starting this his BP has been running low. Last reading was 86/50. He wanted to touch base with us and see what MW recommends he do.  MW - please advise. Thanks.

## 2015-04-11 NOTE — Telephone Encounter (Signed)
When taking verapamil should hold metaprolol as this has the same effects on bp

## 2015-04-11 NOTE — Telephone Encounter (Signed)
Spoke with pt. He is aware of MW's recommendation. Nothing further was needed. 

## 2015-04-27 ENCOUNTER — Encounter: Payer: Self-pay | Admitting: Internal Medicine

## 2015-04-27 ENCOUNTER — Ambulatory Visit (INDEPENDENT_AMBULATORY_CARE_PROVIDER_SITE_OTHER): Payer: BLUE CROSS/BLUE SHIELD | Admitting: Internal Medicine

## 2015-04-27 VITALS — BP 136/92 | HR 65 | Ht 76.0 in | Wt 199.0 lb

## 2015-04-27 DIAGNOSIS — R55 Syncope and collapse: Secondary | ICD-10-CM | POA: Diagnosis not present

## 2015-04-27 NOTE — Progress Notes (Signed)
HPI Mr. Fullington returns today after a long absence from our arrhythmia clinic. He is a 65 yo man with palpitations, PVC's, HTN, and remote syncope. He is exercising regularly and has had no complaints. He denies chest pain or sob. No syncope. His palpitations have resolved. He has been placed on verapamil for cluster HA's and has notes some constipation. No edema. Allergies  Allergen Reactions  . Amoxicillin Rash     Current Outpatient Prescriptions  Medication Sig Dispense Refill  . aspirin 325 MG EC tablet Take 325 mg by mouth daily.      . finasteride (PROSCAR) 5 MG tablet Take 5 mg by mouth daily.      [name] guanFACINE (TENEX) 1 MG tablet TAKE 1 TABLET AT BEDTIME 90 tablet 2  . metoprolol (LOPRESSOR) 50 MG tablet Take 25 mg by mouth daily.    . metoprolol succinate (TOPROL-XL) 50 MG 24 hr tablet TAKE 1 TABLET DAILY. TAKE WITH OR IMMEDIATELY FOLLOWING A MEAL 90 tablet 2  . predniSONE (DELTASONE) 10 MG tablet Use as directed for cluster headaches per Dr. Sharene Skeans.    . Tamsulosin HCl (FLOMAX) 0.4 MG CAPS Take 0.4 mg by mouth daily.      [name] triamterene-hydrochlorothiazide (DYAZIDE) 37.5-25 MG capsule TAKE 1 CAPSULE DAILY 90 capsule 3  . verapamil (CALAN) 120 MG tablet Take 120 mg by mouth as needed. As needed for cluster headaches     No current facility-administered medications for this visit.     Past Medical History  Diagnosis Date  . Hypertension   . Bradycardia   . Syncope   . Diverticulitis of colon     colonoscopy 07/11/2000  . Adult attention deficit disorder   . Nephrolithiasis     prostatitis with noncaseating granulomatous inflammation July 2000 negative IPPD.    [name] HBP (high blood pressure)     change flomax to cardura for bp in December 2011  . Benign prostatic hypertrophy     ROS:   All systems reviewed and negative except as noted in the HPI.   Past Surgical History  Procedure Laterality Date  . Cystostomy w/ bladder biopsy      from trigone  . US  echocardiography  07/06/08  . Tonsillectomy  1958     Family History  Problem Relation Age of Onset  . Heart disease Mother     IHD with CABG  . Kidney failure Father   . Hypertension Father   . Colon cancer Neg Hx      Social History   Social History  . Marital Status: Married    Spouse Name: N/A  . Number of Children: N/A  . Years of Education: N/A   Occupational History  . Lawyer    Social History Main Topics  . Smoking status: Never Smoker   . Smokeless tobacco: Never Used  . Alcohol Use: 1.8 oz/week    3 Glasses of wine per week  . Drug Use: No  . Sexual Activity: Not on file   Other Topics Concern  . Not on file   Social History Narrative     BP 136/92 mmHg  Pulse 65  Ht  (1.93 m)  Wt 199 lb (90.266 kg)  BMI 24.23 kg/m2  Physical Exam:  Well appearing 65 yo man, NAD HEENT: Unremarkable Neck:  6 cm JVD, no thyromegally Lymphatics:  No adenopathy Back:  No CVA tenderness Lungs:  Clear with no wheezes HEART:  Regular rate rhythm, no murmurs,  no rubs, no clicks Abd:  soft, positive bowel sounds, no organomegally, no rebound, no guarding Ext:  2 plus pulses, no edema, no cyanosis, no clubbing Skin:  No rashes no nodules Neuro:  CN II through XII intact, motor grossly intact  EKG - nsr with non-specific T wave abnormality.  Assess/Plan: 1. HTN - his blood pressure is a bit elevated but he thinks that at home it is better. He will continue his current meds. 2. PVC's - he is asymptomatic. He has none on his ECG or exam today. He will undergo watchful waiting 3. Syncope - he has had no episodes in over 3 years. He will undergo watchful waiting. If he had another episode, then I would suggest an ILR and a repeat echo. He had normal LV function by echo remotely.

## 2015-04-27 NOTE — Patient Instructions (Signed)

## 2015-05-19 ENCOUNTER — Encounter: Payer: BLUE CROSS/BLUE SHIELD | Admitting: Internal Medicine

## 2015-05-25 ENCOUNTER — Telehealth: Payer: Self-pay | Admitting: Internal Medicine

## 2015-05-25 NOTE — Telephone Encounter (Signed)
Should be ok but If light headed on the higher dose of verapamil may need less or no tenex - might try half a dose first and if not better try off or  Ov next  as this acts verapamil works the same way as toprol and already on the lowest dose

## 2015-05-25 NOTE — Telephone Encounter (Signed)
Spoke with pt. He is aware of MW's recommendation. Pt will call us if he continues to have issues. Nothing further was needed at this time.

## 2015-05-25 NOTE — Telephone Encounter (Signed)
Spoke with the pt  He is being treated by Dr Catalina Lunger for cluster headaches and is currently taking verapamil 120 mg tid  She wants him to increase it to qid- taking 1 am, 1 noon and 2 in the evening  He states concerned this will make his BP too low and wanted to make sure this would be okay with MW  He had called about dizziness/light headedness approx 1 month ago and was advised to hold toprol  He tried this for 3 days and noticed his heart "working harder" so started back on toprol 1/2 daily  He is also not taking amlodipine anymore  He seems to be fine on his current regimen Please advise, thanks!

## 2015-09-06 ENCOUNTER — Other Ambulatory Visit: Payer: Self-pay | Admitting: Internal Medicine

## 2015-09-28 ENCOUNTER — Telehealth: Payer: Self-pay | Admitting: Internal Medicine

## 2015-09-28 MED ORDER — AMLODIPINE BESYLATE 5 MG PO TABS
5.0000 mg | ORAL_TABLET | Freq: Every day | ORAL | Status: DC
Start: 2015-09-28 — End: 2015-10-19

## 2015-09-28 NOTE — Telephone Encounter (Signed)
Ok to refill but I'm confused because the last instructions were to hold metaprolol while on verapamil   If he's doing fine on present meds though don't change them but set him up for f/u within the next 6 weeks and we'll regroup then

## 2015-09-28 NOTE — Telephone Encounter (Signed)
Spoke with pt. He is aware of MW's response. Rx has been sent in. Nothing further was needed. 

## 2015-09-28 NOTE — Telephone Encounter (Signed)
Spoke with pt and he states that he has stopped the verapamil and is back on amlodipine. He is now completely out of amlodipine and is requesting refills sent to [facility] [address].  MW Please advise as this RX had been d/c'd when pt started verapamil (see TE dated 05/25/15). Thanks!  LOV  01/11/15 ROV  10/13/15   Patient Instructions     Saline nasal spray As needed  Claritin or Allegra daily As needed Drainage .  Delsym As needed Cough .  Fluids and rest  Tylenol As needed  Strep test today.  Please contact office for sooner follow up if symptoms do not improve or worsen or seek emergency care  follow up Dr. Sherene SiresWert As planned and As needed

## 2015-10-13 ENCOUNTER — Other Ambulatory Visit (INDEPENDENT_AMBULATORY_CARE_PROVIDER_SITE_OTHER): Payer: BLUE CROSS/BLUE SHIELD

## 2015-10-13 ENCOUNTER — Encounter: Payer: Self-pay | Admitting: Internal Medicine

## 2015-10-13 ENCOUNTER — Ambulatory Visit (INDEPENDENT_AMBULATORY_CARE_PROVIDER_SITE_OTHER): Payer: BLUE CROSS/BLUE SHIELD | Admitting: Internal Medicine

## 2015-10-13 VITALS — BP 118/78 | HR 74 | Ht 76.0 in | Wt 201.4 lb

## 2015-10-13 DIAGNOSIS — I10 Essential (primary) hypertension: Secondary | ICD-10-CM

## 2015-10-13 DIAGNOSIS — Z0000 Encounter for general adult medical examination without abnormal findings: Secondary | ICD-10-CM

## 2015-10-13 DIAGNOSIS — Z23 Encounter for immunization: Secondary | ICD-10-CM

## 2015-10-13 DIAGNOSIS — E785 Hyperlipidemia, unspecified: Secondary | ICD-10-CM

## 2015-10-13 LAB — CBC WITH DIFFERENTIAL/PLATELET
BASOS ABS: 0 10*3/uL (ref 0.0–0.1)
Basophils Relative: 0.7 % (ref 0.0–3.0)
EOS ABS: 0.4 10*3/uL (ref 0.0–0.7)
Eosinophils Relative: 4.8 % (ref 0.0–5.0)
HEMATOCRIT: 48.2 % (ref 39.0–52.0)
HEMOGLOBIN: 16.2 g/dL (ref 13.0–17.0)
LYMPHS PCT: 20.3 % (ref 12.0–46.0)
Lymphs Abs: 1.5 10*3/uL (ref 0.7–4.0)
MCHC: 33.7 g/dL (ref 30.0–36.0)
MCV: 85.6 fl (ref 78.0–100.0)
MONOS PCT: 9.6 % (ref 3.0–12.0)
Monocytes Absolute: 0.7 10*3/uL (ref 0.1–1.0)
Neutro Abs: 4.8 10*3/uL (ref 1.4–7.7)
Neutrophils Relative %: 64.6 % (ref 43.0–77.0)
Platelets: 313 10*3/uL (ref 150.0–400.0)
RBC: 5.63 Mil/uL (ref 4.22–5.81)
RDW: 13.8 % (ref 11.5–15.5)
WBC: 7.5 10*3/uL (ref 4.0–10.5)

## 2015-10-13 LAB — BASIC METABOLIC PANEL
BUN: 22 mg/dL (ref 6–23)
CALCIUM: 9.9 mg/dL (ref 8.4–10.5)
CHLORIDE: 103 meq/L (ref 96–112)
CO2: 31 meq/L (ref 19–32)
CREATININE: 1.43 mg/dL (ref 0.40–1.50)
GFR: 52.42 mL/min — ABNORMAL LOW (ref >60.00)
Glucose, Bld: 112 mg/dL — ABNORMAL HIGH (ref 70–99)
Potassium: 4.4 mEq/L (ref 3.5–5.1)
SODIUM: 141 meq/L (ref 135–145)

## 2015-10-13 LAB — HEPATIC FUNCTION PANEL
ALBUMIN: 4.2 g/dL (ref 3.5–5.2)
ALK PHOS: 64 U/L (ref 39–117)
ALT: 12 U/L (ref 0–53)
AST: 14 U/L (ref 0–37)
BILIRUBIN DIRECT: 0.1 mg/dL (ref 0.0–0.3)
TOTAL PROTEIN: 7.1 g/dL (ref 6.0–8.3)
Total Bilirubin: 1 mg/dL (ref 0.2–1.2)

## 2015-10-13 LAB — LIPID PANEL
CHOL/HDL RATIO: 3
Cholesterol: 176 mg/dL (ref 0–200)
HDL: 66.8 mg/dL (ref >39.00)
LDL CALC: 96 mg/dL (ref 0–99)
NonHDL: 109.03
Triglycerides: 64 mg/dL (ref 0.0–149.0)
VLDL: 12.8 mg/dL (ref 0.0–40.0)

## 2015-10-13 LAB — TSH: TSH: 0.52 u[IU]/mL (ref 0.35–4.50)

## 2015-10-13 NOTE — Progress Notes (Signed)
Subjective:    Patient ID: [name], male    DOB: Mar 01, 1951 .   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   History of Present Illness  06/2008 One episode syncope eval by Dr Rosette Reveal > reduce toprol by half and leave off dexadrin  November 10, 2008 cpx no aerobics but no limitations and no presyncope. no change in rx.        10/12/2014 f/u ov/[name] re: cpx/ hbp/ h/o syncope Chief Complaint  Patient presents with  . Annual Exam    Pt is fasting.  He c/o non prod cough for the past couple wks- comes and goes.   cough variable/ when present typically assoc with nasal congestion no purulent sputum or sob with persistent sense of pnds no am ha, tia, claudication, sob >>no changes     10/13/2015  f/u ov/[name] re: cpx / hbp/  Chief Complaint  Patient presents with  . Annual Exam    Pt is fasting. No co's today.   walking outside at lunch daily s cp/ sob/ palp/claudication - good tolerance but not aerobic  No   chronic cough or cp or chest tightness, subjective wheeze or overt sinus or hb symptoms. No unusual exp hx or h/o childhood pna/ asthma or knowledge of premature birth.  Sleeping ok without nocturnal  or early am exacerbation  of respiratory  c/o's or need for noct saba. Also denies any obvious fluctuation of symptoms with weather or environmental changes or other aggravating or alleviating factors except as outlined above   Current Medications, Allergies, Complete Past Medical History, Past Surgical History, Family History, and Social History were reviewed in [employer] record.  ROS  The following are not active complaints unless bolded sore throat, dysphagia, dental problems, itching, sneezing,  nasal congestion or excess/ purulent secretions, ear ache,   fever, chills, sweats, unintended wt loss, classically pleuritic or  exertional cp, hemoptysis,  orthopnea pnd or leg swelling, presyncope, palpitations, abdominal pain, anorexia, nausea, vomiting, diarrhea  or change in bowel or bladder habits, change in stools or urine, dysuria,hematuria,  rash, arthralgias, visual complaints, headache, numbness, weakness or ataxia or problems with walking or coordination,  change in mood/affect or memory.                Past Medical History:  HYPERTENSION (ICD-401.9)  [name]  SYNCOPE (ICD-780.2)  DIVERTICULITIS OF COLON (ICD-562.11)..........................................................Marland KitchenLina Sar  - colonoscopy 07/11/2000 > reminder letter sent 08/03/10> reminded 03/20/2012  Health Maintenance...............................................................................................Marland KitchenWert  - DT November 10, 2008  -Prevnar 13 10/12/2014  - CPX 10/13/2015  NEPHROLITHIASIS/BPH......................................................................................[name] Dr. [name]  - prostatitis with noncaseating granulomatous inflammation July 2000 negative IPPD  ADULT ADD.........................................................................................................Marland KitchenDr [name]    Family History:  HBP/ kidney failure father  IHD with cabg at 54 mother  one younger sister healthy no problems    Social History:  The patient lives at home with his wife. 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 He drinks 3  glasses of wine per week.  never smoker         Objective:   Physical Exam  Ambulatory healthy appearing in no acute distress.   wt 208 > 207 November 10, 2008 >  > 02/21/2011  207> 03/20/2012  216 > 04/09/2013  215 > 10/12/2014 215 > 205 01/11/2015 > 10/13/2015  201  HEENT: nl dentition, turbinates, and orophanx. Nl external ear canals  Post pharynx without exudate, mild clear drainage  Neck without JVD/Nodes/TM normal carotids without bruits  Lungs clear to A and P bilaterally without  cough on insp or exp maneuvers  RRR no s3 or murmur  Abd soft and benign  W/ no  organomegaly  Ext warm without calf tenderness, cyanosis clubbing or edema   Skin warm and dry   GU/ rectal per Dr Sheran LawlessWrenn      Labs ordered/ reviewed:      Chemistry      Component Value Date/Time   NA 141 10/13/2015 0915   K 4.4 10/13/2015 0915   CL 103 10/13/2015 0915   CO2 31 10/13/2015 0915   BUN 22 10/13/2015 0915   CREATININE 1.43 10/13/2015 0915      Component Value Date/Time   CALCIUM 9.9 10/13/2015 0915   ALKPHOS 64 10/13/2015 0915   AST 14 10/13/2015 0915   ALT 12 10/13/2015 0915   BILITOT 1.0 10/13/2015 0915        Lab Results  Component Value Date   WBC 7.5 10/13/2015   HGB 16.2 10/13/2015   HCT 48.2 10/13/2015   MCV 85.6 10/13/2015   PLT 313.0 10/13/2015        Lab Results  Component Value Date   TSH 0.52 10/13/2015                         Assessment & Plan:

## 2015-10-13 NOTE — Patient Instructions (Addendum)
Pneumoccal vaccine today   Please remember to go to the lab  department downstairs for your tests - we will call you with the results when they are available.  Please schedule a follow up visit in 6 months but call sooner if needed

## 2015-10-13 NOTE — Progress Notes (Signed)
Quick Note:  lmom with results ______ 

## 2015-10-16 ENCOUNTER — Encounter: Payer: Self-pay | Admitting: Internal Medicine

## 2015-10-16 NOTE — Assessment & Plan Note (Signed)
Lab Results  Component Value Date   CREATININE 1.43 10/13/2015   CREATININE 1.23 10/12/2014   CREATININE 1.4 04/10/2013    Estimated Creatinine Clearance: 61.5 mL/min (by C-G formula based on SCr of 1.43 mg/dL).  Adequate control on present rx, reviewed > no change in rx needed

## 2015-10-16 NOTE — Assessment & Plan Note (Signed)
Lab Results  Component Value Date   CHOL 176 10/13/2015   HDL 66.80 10/13/2015   LDLCALC 96 10/13/2015   TRIG 64.0 10/13/2015   CHOLHDL 3 10/13/2015    Adequate control on present rx, reviewed > no change in rx needed

## 2015-10-16 NOTE — Assessment & Plan Note (Signed)
Up to date

## 2015-10-19 ENCOUNTER — Other Ambulatory Visit: Payer: Self-pay | Admitting: Internal Medicine

## 2015-10-19 MED ORDER — AMLODIPINE BESYLATE 5 MG PO TABS
5.0000 mg | ORAL_TABLET | Freq: Every day | ORAL | 3 refills | Status: DC
Start: 2015-10-19 — End: 2016-04-19

## 2015-12-05 ENCOUNTER — Other Ambulatory Visit: Payer: Self-pay | Admitting: Internal Medicine

## 2015-12-18 ENCOUNTER — Other Ambulatory Visit: Payer: Self-pay | Admitting: Internal Medicine

## 2016-03-04 ENCOUNTER — Other Ambulatory Visit: Payer: Self-pay | Admitting: Internal Medicine

## 2016-03-18 ENCOUNTER — Other Ambulatory Visit: Payer: Self-pay | Admitting: Internal Medicine

## 2016-04-03 ENCOUNTER — Telehealth: Payer: Self-pay | Admitting: Internal Medicine

## 2016-04-03 NOTE — Telephone Encounter (Signed)
Spoke with pt. He was just verifying if he needed an appointment to get further refills according to [facility]. I did not see where that was documented in the pt. Chart but I did let him know that he has an appointment this mont with MW and he can discuss if his bp medication needs to be changed. Asked pt. Was he out of his medication he said he was not. Nothing further is needed at this time.

## 2016-04-03 NOTE — Telephone Encounter (Signed)
Patient returned call, CB is [phone].

## 2016-04-03 NOTE — Telephone Encounter (Signed)
lmomtcb x1 

## 2016-04-19 ENCOUNTER — Other Ambulatory Visit (INDEPENDENT_AMBULATORY_CARE_PROVIDER_SITE_OTHER): Payer: BLUE CROSS/BLUE SHIELD

## 2016-04-19 ENCOUNTER — Telehealth: Payer: Self-pay | Admitting: Internal Medicine

## 2016-04-19 ENCOUNTER — Encounter: Payer: Self-pay | Admitting: Internal Medicine

## 2016-04-19 ENCOUNTER — Ambulatory Visit (INDEPENDENT_AMBULATORY_CARE_PROVIDER_SITE_OTHER): Payer: BLUE CROSS/BLUE SHIELD | Admitting: Internal Medicine

## 2016-04-19 VITALS — BP 118/76 | HR 68 | Ht 72.0 in | Wt 206.0 lb

## 2016-04-19 DIAGNOSIS — I10 Essential (primary) hypertension: Secondary | ICD-10-CM | POA: Diagnosis not present

## 2016-04-19 DIAGNOSIS — N181 Chronic kidney disease, stage 1: Secondary | ICD-10-CM

## 2016-04-19 LAB — BASIC METABOLIC PANEL
BUN: 23 mg/dL (ref 6–23)
CALCIUM: 9.6 mg/dL (ref 8.4–10.5)
CHLORIDE: 105 meq/L (ref 96–112)
CO2: 30 mEq/L (ref 19–32)
CREATININE: 1.31 mg/dL (ref 0.40–1.50)
GFR: 57.91 mL/min — AB (ref >60.00)
Glucose, Bld: 113 mg/dL — ABNORMAL HIGH (ref 70–99)
Potassium: 4 mEq/L (ref 3.5–5.1)
Sodium: 141 mEq/L (ref 135–145)

## 2016-04-19 MED ORDER — TRIAMTERENE-HCTZ 37.5-25 MG PO CAPS: 1.0000 | ORAL_CAPSULE | Freq: Every day | ORAL | 1 refills | Status: DC

## 2016-04-19 MED ORDER — AMLODIPINE BESYLATE 5 MG PO TABS: 5.0000 mg | ORAL_TABLET | Freq: Every day | ORAL | 3 refills | Status: DC

## 2016-04-19 NOTE — Patient Instructions (Addendum)
Please remember to go to the lab   department downstairs for your tests - we will call you with the results when they are available.  Please schedule a follow up visit in 6 months but call sooner if needed     

## 2016-04-19 NOTE — Progress Notes (Signed)
Subjective:    Patient ID: [name], male    DOB: 09-14-1950 .   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   History of Present Illness  06/2008 One episode syncope eval by Dr Rosette Reveal > reduce toprol by half and leave off dexadrin  November 10, 2008 cpx no aerobics but no limitations and no presyncope. no change in rx.      04/19/2016  f/u ov/[name] re: hbp/ cri  Chief Complaint  Patient presents with  . Follow-up    Doing well and denies any co's today.   walks 45 min at lunch daily, no hills at shopping slt faster pace than normal  No presyncope, no cp, tia or claudication   No obvious day to day or daytime variability in ex tol or assoc chronic cough or  chest tightness, subjective wheeze or overt sinus or hb symptoms. No unusual exp hx or h/o childhood pna/ asthma or knowledge of premature birth.  Sleeping ok without nocturnal  or early am exacerbation  of respiratory  c/o's or need for noct saba. Also denies any obvious fluctuation of symptoms with weather or environmental changes or other aggravating or alleviating factors except as outlined above   Current Medications, Allergies, Complete Past Medical History, Past Surgical History, Family History, and Social History were reviewed in [employer] record.  ROS  The following are not active complaints unless bolded sore throat, dysphagia, dental problems, itching, sneezing,  nasal congestion or excess/ purulent secretions, ear ache,   fever, chills, sweats, unintended wt loss, classically pleuritic or exertional cp, hemoptysis,  orthopnea pnd or leg swelling, presyncope, palpitations, abdominal pain, anorexia, nausea, vomiting, diarrhea  or change in bowel or bladder habits, change in stools or urine, dysuria,hematuria,  rash, arthralgias, visual complaints, headache, numbness,  weakness or ataxia or problems with walking or coordination,  change in mood/affect or memory.         Past Medical History:  HYPERTENSION (ICD-401.9)  [name]  SYNCOPE (ICD-780.2)  DIVERTICULITIS OF COLON (ICD-562.11)..........................................................Marland KitchenLina Sar  - colonoscopy 07/11/2000 > reminder letter sent 08/03/10> reminded 03/20/2012  Health Maintenance...............................................................................................Marland KitchenWert  - DT November 10, 2008  -Prevnar 13 10/12/2014 , pneumoccal vaccine 09/2015  - CPX 10/13/2015  NEPHROLITHIASIS/BPH......................................................................................[name] Dr. [name]  - prostatitis with noncaseating granulomatous inflammation July 2000 negative IPPD  ADULT ADD.........................................................................................................Marland KitchenDr [name]    Family History:  HBP/ kidney failure father  IHD with cabg at 40 mother  one younger sister healthy no problems    Social History:  The patient lives at home with his wife. 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 He drinks 3  glasses of wine per week.  never smoker         Objective:   Physical Exam  Ambulatory healthy appearing in no acute distress.   wt 208 > 207 November 10, 2008 >  > 02/21/2011  207> 03/20/2012  216 > 04/09/2013  215 > 10/12/2014 215 > 205 01/11/2015 > 10/13/2015  201 > 04/19/2016   206   HEENT: nl dentition, turbinates, and oropharynx. Nl external ear canals  Post pharynx without exudate, mild clear drainage  Neck without JVD/Nodes/TM normal carotids without bruits  Lungs clear to A and P bilaterally without cough on insp or exp maneuvers  RRR no s3 or murmur  Abd soft and benign  W/  no  organomegaly  Ext warm without calf tenderness, cyanosis clubbing or edema   Skin warm and dry        Labs ordered/ reviewed:    Chemistry      Component  Value Date/Time   NA 141 04/19/2016 0906   K 4.0 04/19/2016 0906   CL 105 04/19/2016 0906   CO2 30 04/19/2016 0906   BUN 23 04/19/2016 0906   CREATININE 1.31 04/19/2016 0906      Component Value Date/Time   CALCIUM 9.6 04/19/2016 0906   ALKPHOS 64 10/13/2015 0915   AST 14 10/13/2015 0915   ALT 12 10/13/2015 0915   BILITOT 1.0 10/13/2015 0915                           Assessment & Plan:

## 2016-04-19 NOTE — Telephone Encounter (Signed)
I called pt and left a VM that I would [facility]. I advised him to call back if he needed anything furthur.

## 2016-04-19 NOTE — Assessment & Plan Note (Signed)
Probably has mild benign hypertensive nephrosclerosis  Lab Results  Component Value Date   CREATININE 1.31 04/19/2016   CREATININE 1.43 10/13/2015   CREATININE 1.23 10/12/2014      No change in rx needed  I had an extended discussion with the patient reviewing all relevant studies completed to date and  lasting 15 to 20 minutes of a 25 minute visit    Each maintenance medication was reviewed in detail including most importantly the difference between maintenance and prns and under what circumstances the prns are to be triggered using an action plan format that is not reflected in the computer generated alphabetically organized AVS.    Please see AVS for specific instructions unique to this visit that I personally wrote and verbalized to the the pt in detail and then reviewed with pt  by my nurse highlighting any  changes in therapy recommended at today's visit to their plan of care.   Estimated Creatinine Clearance: 64.9 mL/min (by C-G formula based on SCr of 1.31 mg/dL).

## 2016-04-19 NOTE — Progress Notes (Signed)
Called and left him a detailed msg

## 2016-04-19 NOTE — Assessment & Plan Note (Signed)
Adequate control on present rx, reviewed in detail with pt > no change in rx needed   

## 2016-05-14 ENCOUNTER — Telehealth: Payer: Self-pay | Admitting: Internal Medicine

## 2016-05-14 ENCOUNTER — Encounter: Payer: Self-pay | Admitting: *Deleted

## 2016-05-14 DIAGNOSIS — R040 Epistaxis: Secondary | ICD-10-CM

## 2016-05-14 NOTE — Telephone Encounter (Signed)
Spoke with pt. States that he has been having nosebleeds. These started about 3 weeks ago. Nosebleeds only happen in the middle of the night and only bleeds out of the right nostril. Pt can get the bleeding under control. This is a new issue for the pt. He would like MW's recommendations.  MW - please advise. Thanks.

## 2016-05-14 NOTE — Telephone Encounter (Signed)
Hold asa until stops/ ent eval [facility] if not established elsewhere

## 2016-05-14 NOTE — Telephone Encounter (Signed)
Spoke with pt made him aware of MW recc Pt understood and agreed to the order being placed for ENT. The order was placed nothing further is needed at this time.

## 2016-05-15 ENCOUNTER — Encounter: Payer: Self-pay | Admitting: Internal Medicine

## 2016-05-15 ENCOUNTER — Ambulatory Visit (INDEPENDENT_AMBULATORY_CARE_PROVIDER_SITE_OTHER): Payer: BLUE CROSS/BLUE SHIELD | Admitting: Internal Medicine

## 2016-05-15 ENCOUNTER — Other Ambulatory Visit (INDEPENDENT_AMBULATORY_CARE_PROVIDER_SITE_OTHER): Payer: BLUE CROSS/BLUE SHIELD

## 2016-05-15 VITALS — BP 130/84 | HR 75 | Ht 76.0 in | Wt 208.0 lb

## 2016-05-15 DIAGNOSIS — I10 Essential (primary) hypertension: Secondary | ICD-10-CM | POA: Diagnosis not present

## 2016-05-15 DIAGNOSIS — R040 Epistaxis: Secondary | ICD-10-CM

## 2016-05-15 LAB — CBC WITH DIFFERENTIAL/PLATELET
BASOS ABS: 0.1 10*3/uL (ref 0.0–0.1)
BASOS PCT: 1 % (ref 0.0–3.0)
EOS ABS: 0.2 10*3/uL (ref 0.0–0.7)
Eosinophils Relative: 3.2 % (ref 0.0–5.0)
HEMATOCRIT: 45.8 % (ref 39.0–52.0)
Hemoglobin: 15.6 g/dL (ref 13.0–17.0)
LYMPHS ABS: 1.6 10*3/uL (ref 0.7–4.0)
LYMPHS PCT: 20 % (ref 12.0–46.0)
MCHC: 34 g/dL (ref 30.0–36.0)
MCV: 87.1 fl (ref 78.0–100.0)
MONO ABS: 0.7 10*3/uL (ref 0.1–1.0)
Monocytes Relative: 9.6 % (ref 3.0–12.0)
NEUTROS ABS: 5.2 10*3/uL (ref 1.4–7.7)
NEUTROS PCT: 66.2 % (ref 43.0–77.0)
PLATELETS: 336 10*3/uL (ref 150.0–400.0)
RBC: 5.26 Mil/uL (ref 4.22–5.81)
RDW: 14.1 % (ref 11.5–15.5)
WBC: 7.8 10*3/uL (ref 4.0–10.5)

## 2016-05-15 MED ORDER — GUANFACINE HCL 1 MG PO TABS: ORAL_TABLET | ORAL | 3 refills | Status: DC

## 2016-05-15 MED ORDER — ASPIRIN EC 81 MG PO TBEC: 81.0000 mg | DELAYED_RELEASE_TABLET | Freq: Every day | ORAL | Status: AC

## 2016-05-15 NOTE — Progress Notes (Signed)
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   History of Present Illness  06/2008 One episode syncope eval by Tommy Rosette RevealG Taylor > reduce toprol by half and leave off dexadrin  November 10, 2008 cpx no aerobics but no limitations and no presyncope. no change in rx.      04/19/2016  f/u ov/[name] re: hbp/ cri  Chief Complaint  Patient presents with  . Follow-up    Doing well and denies any co's today.   walks 45 min at lunch daily, no hills at shopping slt faster pace than normal rec No change rx    05/15/2016 acute extended ov/[name] re: new onset epistaxis  Chief Complaint  Patient presents with  . Follow-up    over the last 2 weeks he has had spontanous nosebleeds, usually at night, dizziness when getting out of bed, appt with ENT is 05/30/16, blood pressure well controlled but lately his bp has been running high  acute onset avg qod worse lying down all from R side / took Asprin 05/13/16 > total amt of blood probabt one half cup since onset  Also daily light headedness standing   No obvious day to day or daytime variability in ex tol or assoc chronic cough or  chest tightness, subjective wheeze or overt  hb symptoms. No unusual exp hx or h/o childhood pna/ asthma or knowledge of premature birth.  Sleeping ok without nocturnal  or early am exacerbation  of respiratory  c/o's or need for noct saba. Also denies any obvious fluctuation of symptoms with weather or environmental changes or other aggravating or alleviating factors except as outlined above   Current Medications, Allergies, Complete Past Medical History, Past Surgical History, Family History, and Social History were reviewed in [name].  ROS  The following are not active complaints unless bolded sore  throat, dysphagia, dental problems, itching, sneezing,  nasal congestion or excess/ purulent secretions, ear ache,   fever, chills, sweats, unintended wt loss, classically pleuritic or exertional cp, hemoptysis,  orthopnea pnd or leg swelling, presyncope, palpitations, abdominal pain, anorexia, nausea, vomiting, diarrhea  or change in bowel or bladder habits, change in stools or urine, dysuria,hematuria,  rash, arthralgias, visual complaints, headache, numbness, weakness or ataxia or problems with walking or coordination,  change in mood/affect or memory.         Past Medical History:  HYPERTENSION (ICD-401.9)  [name]  SYNCOPE (ICD-780.2)  DIVERTICULITIS OF COLON (ICD-562.11)..........................................................[name].[name]  - colonoscopy 07/11/2000 > reminder letter sent 08/03/10> reminded 03/20/2012  Health Maintenance...............................................................................................[name].[name]  - DT November 10, 2008  -Prevnar 13 10/12/2014 , pneumoccal vaccine 09/2015  - CPX 10/13/2015  NEPHROLITHIASIS/BPH......................................................................................[name]. [name]  - prostatitis with noncaseating granulomatous inflammation July 2000 negative IPPD  ADULT ADD.........................................................................................................[name].[name]    Family History:  HBP/ kidney failure father  IHD with cabg at 4070 mother  one younger sister healthy no problems    Social History:  The patient lives at home with his wife. 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 He drinks 3  glasses of wine per week.  never smoker         Objective:   Physical Exam  Ambulatory healthy appearing in no acute distress.   wt 208 >  207 November 10, 2008 >  > 02/21/2011  207> 03/20/2012  216 > 04/09/2013  215 > 10/12/2014 215 > 205 01/11/2015 > 10/13/2015  201 > 04/19/2016   206 > 05/15/2016   208   Vital signs reviewed   Note bp  130/84 standing vs 148/88 sitting   HEENT: nl dentition, turbinates s crusting, and nl oropharynx. Nl external ear canals  Post pharynx without exudate, mild clear drainage  Neck without JVD/Nodes/TM normal carotids without bruits  Lungs clear to A and P bilaterally without cough on insp or exp maneuvers  RRR no s3 or murmur  Abd soft and benign  W/ no  organomegaly  Ext warm without calf tenderness, cyanosis clubbing or edema   Skin warm and dry   Neuro:  No nystagmus/ nl gait/ no motor def or cerebellar findings      Lab Results  Component Value Date   WBC 7.8 05/15/2016   HGB 15.6 05/15/2016   HCT 45.8 05/15/2016   MCV 87.1 05/15/2016   PLT 336.0 05/15/2016         Lab Results  Component Value Date   HGB 15.6 05/15/2016   HGB 16.2 10/13/2015   HGB 16.2 10/12/2014                   Assessment & Plan:

## 2016-05-15 NOTE — Progress Notes (Signed)
Left detailed msg with results

## 2016-05-15 NOTE — Patient Instructions (Signed)
Increase tenex to 1 and a half daily and hold the aspirin until no bleeding and restart at 81 mg daily   Please remember to go to the lab department downstairs in the basement  for your tests - we will call you with the results when they are available.  Keep appt with ENT

## 2016-05-20 NOTE — Assessment & Plan Note (Signed)
Onset around Feb 7 > rec hold asa when actively bleeding, just take 81 mg and referred to ent

## 2016-05-20 NOTE — Assessment & Plan Note (Addendum)
not the likely cause of any of his symptoms but could be a bit tighter controlled so will try another half a tenex at this point and he will continue to self monitor and call back if not at goal  Each maintenance medication was reviewed in detail including most importantly the difference between maintenance and as needed and under what circumstances the prns are to be used.  Please see AVS for specific  Instructions which are unique to this visit and I personally typed out  which were reviewed in detail in writing with the patient and a copy provided.

## 2016-06-02 ENCOUNTER — Other Ambulatory Visit: Payer: Self-pay | Admitting: Internal Medicine

## 2016-10-08 ENCOUNTER — Ambulatory Visit: Payer: BLUE CROSS/BLUE SHIELD | Admitting: Internal Medicine

## 2016-10-16 ENCOUNTER — Other Ambulatory Visit: Payer: Self-pay | Admitting: Internal Medicine

## 2016-11-08 ENCOUNTER — Ambulatory Visit (INDEPENDENT_AMBULATORY_CARE_PROVIDER_SITE_OTHER)
Admission: RE | Admit: 2016-11-08 | Discharge: 2016-11-08 | Disposition: A | Discharge status: Home / Self Care | Payer: BLUE CROSS/BLUE SHIELD | Source: Ambulatory Visit | Attending: Internal Medicine | Admitting: Internal Medicine

## 2016-11-08 ENCOUNTER — Other Ambulatory Visit (INDEPENDENT_AMBULATORY_CARE_PROVIDER_SITE_OTHER): Payer: BLUE CROSS/BLUE SHIELD

## 2016-11-08 ENCOUNTER — Ambulatory Visit (INDEPENDENT_AMBULATORY_CARE_PROVIDER_SITE_OTHER): Payer: BLUE CROSS/BLUE SHIELD | Admitting: Internal Medicine

## 2016-11-08 ENCOUNTER — Encounter: Payer: Self-pay | Admitting: Internal Medicine

## 2016-11-08 VITALS — BP 128/70 | HR 70 | Ht 76.0 in | Wt 200.0 lb

## 2016-11-08 DIAGNOSIS — E785 Hyperlipidemia, unspecified: Secondary | ICD-10-CM

## 2016-11-08 DIAGNOSIS — I10 Essential (primary) hypertension: Secondary | ICD-10-CM

## 2016-11-08 DIAGNOSIS — R251 Tremor, unspecified: Secondary | ICD-10-CM | POA: Diagnosis not present

## 2016-11-08 DIAGNOSIS — R040 Epistaxis: Secondary | ICD-10-CM

## 2016-11-08 DIAGNOSIS — Z0000 Encounter for general adult medical examination without abnormal findings: Secondary | ICD-10-CM

## 2016-11-08 DIAGNOSIS — N181 Chronic kidney disease, stage 1: Secondary | ICD-10-CM | POA: Diagnosis not present

## 2016-11-08 LAB — URINALYSIS
Bilirubin Urine: NEGATIVE
Hgb urine dipstick: NEGATIVE
Ketones, ur: NEGATIVE
LEUKOCYTES UA: NEGATIVE
NITRITE: NEGATIVE
PH: 6.5 (ref 5.0–8.0)
SPECIFIC GRAVITY, URINE: 1.015 (ref 1.000–1.030)
Total Protein, Urine: NEGATIVE
Urine Glucose: NEGATIVE
Urobilinogen, UA: 0.2 (ref 0.0–1.0)

## 2016-11-08 LAB — BASIC METABOLIC PANEL
BUN: 20 mg/dL (ref 6–23)
CHLORIDE: 101 meq/L (ref 96–112)
CO2: 31 mEq/L (ref 19–32)
CREATININE: 1.3 mg/dL (ref 0.40–1.50)
Calcium: 9.4 mg/dL (ref 8.4–10.5)
GFR: 58.33 mL/min — AB (ref >60.00)
Glucose, Bld: 107 mg/dL — ABNORMAL HIGH (ref 70–99)
POTASSIUM: 4 meq/L (ref 3.5–5.1)
Sodium: 137 mEq/L (ref 135–145)

## 2016-11-08 LAB — CBC WITH DIFFERENTIAL/PLATELET
BASOS PCT: 0.9 % (ref 0.0–3.0)
Basophils Absolute: 0.1 10*3/uL (ref 0.0–0.1)
Eosinophils Absolute: 0.2 10*3/uL (ref 0.0–0.7)
Eosinophils Relative: 2.7 % (ref 0.0–5.0)
HEMATOCRIT: 47 % (ref 39.0–52.0)
Hemoglobin: 15.7 g/dL (ref 13.0–17.0)
LYMPHS PCT: 16.5 % (ref 12.0–46.0)
Lymphs Abs: 1.1 10*3/uL (ref 0.7–4.0)
MCHC: 33.5 g/dL (ref 30.0–36.0)
MCV: 88.7 fl (ref 78.0–100.0)
MONOS PCT: 8.3 % (ref 3.0–12.0)
Monocytes Absolute: 0.6 10*3/uL (ref 0.1–1.0)
NEUTROS ABS: 4.9 10*3/uL (ref 1.4–7.7)
Neutrophils Relative %: 71.6 % (ref 43.0–77.0)
PLATELETS: 312 10*3/uL (ref 150.0–400.0)
RBC: 5.3 Mil/uL (ref 4.22–5.81)
RDW: 14.6 % (ref 11.5–15.5)
WBC: 6.9 10*3/uL (ref 4.0–10.5)

## 2016-11-08 LAB — LIPID PANEL
CHOLESTEROL: 164 mg/dL (ref 0–200)
HDL: 62.7 mg/dL (ref >39.00)
LDL Cholesterol: 89 mg/dL (ref 0–99)
NonHDL: 100.86
Total CHOL/HDL Ratio: 3
Triglycerides: 59 mg/dL (ref 0.0–149.0)
VLDL: 11.8 mg/dL (ref 0.0–40.0)

## 2016-11-08 LAB — TSH: TSH: 0.43 u[IU]/mL (ref 0.35–4.50)

## 2016-11-08 NOTE — Progress Notes (Signed)
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   History of Present Illness  06/2008 One episode syncope eval by Dr Rosette Reveal > reduce toprol by half and leave off dexadrin  November 10, 2008 cpx no aerobics but no limitations and no presyncope. no change in rx.           05/15/2016 acute extended ov/Elajah Kunsman re: new onset epistaxis  Chief Complaint  Patient presents with  . Follow-up    over the last 2 weeks he has had spontanous nosebleeds, usually at night, dizziness when getting out of bed, appt with ENT is 05/30/16, blood pressure well controlled but lately his bp has been running high  acute onset avg qod worse lying down all from R side / took Asprin 05/13/16 > total amt of blood probaby one half cup since onset  Also daily light headedness standing  rec Increase tenex to 1 and a half daily and hold the aspirin until no bleeding and restart at 81 mg daily     11/08/2016  f/u ov/Amiere Cawley re:  Back on baby asa no more nose bleeds  Chief Complaint  Patient presents with  . Follow-up    pt doing well today, no complaints.     head tremor new cc x sev years, slt worse, neg fm hx tremor  Not limited by breathing from desired activities  / claudication No tia hx  No obvious day to day or daytime variability or assoc excess/ purulent sputum or mucus plugs or hemoptysis or cp or chest tightness, subjective wheeze or overt sinus or hb symptoms. No unusual exp hx or h/o childhood pna/ asthma or knowledge of premature birth.  Sleeping ok without nocturnal  or early am exacerbation  of respiratory  c/o's or need for noct saba. Also denies any obvious fluctuation of symptoms with weather or environmental changes or other aggravating or alleviating factors except as outlined above   Current Medications,  Allergies, Complete Past Medical History, Past Surgical History, Family History, and Social History were reviewed in [facility] record.  ROS  The following are not active complaints unless bolded sore throat, dysphagia, dental problems, itching, sneezing,  nasal congestion or excess/ purulent secretions, ear ache,   fever, chills, sweats, unintended wt loss, classically pleuritic or exertional cp,  orthopnea pnd or leg swelling, presyncope, palpitations, abdominal pain, anorexia, nausea, vomiting, diarrhea  or change in bowel or bladder habits, change in stools or urine, dysuria,hematuria,  rash, arthralgias, visual complaints, headache, numbness, weakness or ataxia or problems with walking or coordination,  change in mood/affect or memory.          Past Medical History:  HYPERTENSION (ICD-401.9)  [name]  SYNCOPE (ICD-780.2)  DIVERTICULITIS OF COLON (ICD-562.11)..........................................................Marland KitchenLina Sar  - colonoscopy 07/11/2000 > reminder letter sent 08/03/10> reminded 03/20/2012  Health Maintenance...............................................................................................Marland KitchenWert  - DT November 10, 2008  -Prevnar 13 10/12/2014 , pneumoccal vaccine 09/2015  - CPX 11/08/2016  NEPHROLITHIASIS/BPH......................................................................................[name] Dr. [name]  - prostatitis with noncaseating granulomatous inflammation July 2000 negative IPPD  ADULT ADD.........................................................................................................Marland KitchenDr [name]    Family History:  HBP/ kidney failure father  IHD with cabg at 62 mother  one younger sister healthy no problems    Social History:  The patient lives at  home with his wife. 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 He drinks 3  glasses of wine per week.  never smoker         Objective:   Physical  Exam  Ambulatory healthy appearing in no acute distress   wt 208 > 207 November 10, 2008 >  > 02/21/2011  207> 03/20/2012  216 > 04/09/2013  215 > 10/12/2014 215 > 205 01/11/2015 > 10/13/2015  201 > 04/19/2016   206 > 05/15/2016  208 >  11/08/2016  200   Vital signs reviewed   Note bp   128/70   HEENT: nl dentition, turbinates nl bilaterally , and nl oropharynx. Nl external ear canals  Post pharynx without exudate, no  drainage  Neck without JVD/Nodes/TM normal carotids without bruits  Lungs clear to A and P bilaterally without cough on insp or exp maneuvers  RRR no s3 or murmur  Abd soft and benign  W/ no  organomegaly  Ext warm without calf tenderness, cyanosis clubbing or edema   Skin warm and dry   Neuro:  No nystagmus/ nl gait/ no motor def or cerebellar findings or obvious tremor/ rigidity     CXR PA and Lateral:   11/08/2016 :    I personally reviewed images and agree with radiology impression as follows:    Cardiac shadow is stable. The lungs are well aerated bilaterally. No focal infiltrate or sizable effusion is seen. Mild degenerative changes of the thoracic spine are noted.   Labs ordered/ reviewed:      Chemistry      Component Value Date/Time   NA 137 11/08/2016 1145   K 4.0 11/08/2016 1145   CL 101 11/08/2016 1145   CO2 31 11/08/2016 1145   BUN 20 11/08/2016 1145   CREATININE 1.30 11/08/2016 1145      Component Value Date/Time   CALCIUM 9.4 11/08/2016 1145   ALKPHOS 64 10/13/2015 0915   AST 14 10/13/2015 0915   ALT 12 10/13/2015 0915   BILITOT 1.0 10/13/2015 0915        Lab Results  Component Value Date   WBC 6.9 11/08/2016   HGB 15.7 11/08/2016   HCT 47.0 11/08/2016   MCV 88.7 11/08/2016   PLT 312.0 11/08/2016         Lab Results  Component Value Date   TSH 0.43 11/08/2016              Assessment & Plan:

## 2016-11-08 NOTE — Patient Instructions (Signed)
Please see patient coordinator before you leave today  to schedule neurology eval for tremor and you can call your ENT doctor for audiology referral   Please remember to go to the lab and x-ray department downstairs in the basement  for your tests - we will call you with the results when they are available.

## 2016-11-09 ENCOUNTER — Encounter: Payer: Self-pay | Admitting: Internal Medicine

## 2016-11-09 NOTE — Assessment & Plan Note (Signed)
All vaccinations up to date/ GU f/u by [name]

## 2016-11-09 NOTE — Assessment & Plan Note (Addendum)
Onset around May 02 2016  > rec hold asa when actively bleeding, just take 81 mg and referred to ent     Lab Results  Component Value Date   HGB 15.7 11/08/2016   HGB 15.6 05/15/2016   HGB 16.2 10/13/2015    Resolved/ back on asa 81 mg daily

## 2016-11-09 NOTE — Assessment & Plan Note (Signed)
Lab Results  Component Value Date   CREATININE 1.30 11/08/2016   CREATININE 1.31 04/19/2016   CREATININE 1.43 10/13/2015     Adequate control on present rx, reviewed in detail with pt > no change in rx needed

## 2016-11-09 NOTE — Assessment & Plan Note (Signed)
Lab Results  Component Value Date   CREATININE 1.30 11/08/2016   CREATININE 1.31 04/19/2016   CREATININE 1.43 10/13/2015     Estimated Creatinine Clearance: 66.8 mL/min (by C-G formula based on SCr of 1.3 mg/dL).   Likely benign and stable hbp nephrosclerosis > rx is treat bp

## 2016-11-09 NOTE — Assessment & Plan Note (Signed)
I can't detect significant tremor but pt is concerned > refer to neurology

## 2016-11-09 NOTE — Assessment & Plan Note (Signed)
Lab Results  Component Value Date   CHOL 164 11/08/2016   HDL 62.70 11/08/2016   LDLCALC 89 11/08/2016   TRIG 59.0 11/08/2016   CHOLHDL 3 11/08/2016   Adequate control on present rx, reviewed in detail with pt > no change in rx needed  = diet/ ex

## 2016-11-30 NOTE — Progress Notes (Signed)
Subjective:   [name] was seen in consultation in the movement disorder clinic at the request of Nyoka CowdenWert, Michael B, MD.   This patient is accompanied in the office by his spouse who supplements the history. The evaluation is for head tremor.  The records that were made available to me were reviewed.  Tremor started decades ago.  Wife notes it more than patient.  Head tremor is mostly in the "no" direction but sometimes it is circular.  She only notes it when head is straight ahead.  Pt hardly notices it at all.  Doesn't come out with anger but he is rarely angered.  He notes it mostly at rest and relaxed.    There is a family hx of tremor in the head in mom.  There is no hand tremor.  No voice tremor.    Affected by caffeine:  No. (2 cups coffee per day) Affected by alcohol:  No. Affected by stress:  unknown Affected by fatigue:  Yes.    Current/Previously tried tremor medications: n/a  Current medications that may exacerbate tremor:  n/a  Outside reports reviewed: lab reports, office notes and referral letter/letters.  Allergies  Allergen Reactions  . Amoxicillin Rash    Outpatient Encounter Prescriptions as of 12/04/2016  Medication Sig  . amLODipine (NORVASC) 5 MG tablet Take 1 tablet (5 mg total) by mouth daily.  [name]. aspirin EC 81 MG tablet Take 1 tablet (81 mg total) by mouth daily.  . finasteride (PROSCAR) 5 MG tablet Take 5 mg by mouth daily.    [name]. guanFACINE (TENEX) 1 MG tablet One and a half at bedtime  . metoprolol succinate (TOPROL-XL) 50 MG 24 hr tablet TAKE 1 TABLET DAILY. TAKE WITH OR IMMEDIATELY FOLLOWING A MEAL  . Tamsulosin HCl (FLOMAX) 0.4 MG CAPS Take 0.4 mg by mouth daily.    [name]. triamterene-hydrochlorothiazide (DYAZIDE) 37.5-25 MG capsule TAKE 1 CAPSULE DAILY   No facility-administered encounter medications on file as of 12/04/2016.     Past Medical History:  Diagnosis Date  . Adult attention deficit disorder   . Benign prostatic hypertrophy   .  Bradycardia   . Diverticulitis of colon    colonoscopy 07/11/2000  . HBP (high blood pressure)    change flomax to cardura for bp in December 2011  . Nephrolithiasis    prostatitis with noncaseating granulomatous inflammation July 2000 negative IPPD.    [name]. Syncope     Past Surgical History:  Procedure Laterality Date  . CYSTOSTOMY W/ BLADDER BIOPSY     from trigone  . TONSILLECTOMY  1958  . US ECHOCARDIOGRAPHY  07/06/08    Social History   Social History  . Marital status: Married    Spouse name: N/A  . Number of children: N/A  . Years of education: N/A   Occupational History  . 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 Smoking status: Never Smoker  . Smokeless tobacco: Never Used  . Alcohol use 1.8 oz/week    3 Glasses of wine per week  . Drug use: No  . Sexual activity: Not on file   Other Topics Concern  . Not on file   Social History Narrative  . No narrative on file    Family Status  Relation Status  . Mother Deceased  . Father Deceased  . Sister Alive  . Child Alive  . Neg Hx (Not Specified)    Review of Systems A complete 10 system ROS  was obtained and was negative apart from what is mentioned.   Objective:   VITALS:   Vitals:   12/04/16 0841  BP: 110/72  Pulse: 73  Weight: 205 lb (93 kg)  Height:  (1.93 m)   Gen:  Appears stated age and in NAD. HEENT:  Normocephalic, atraumatic. The mucous membranes are moist. The superficial temporal arteries are without ropiness or tenderness. Cardiovascular: Regular rate and rhythm. Lungs: Clear to auscultation bilaterally. Neck: There are no carotid bruits noted bilaterally.  NEUROLOGICAL:  Orientation:  The patient is alert and oriented x 3.  Recent and remote memory are intact.  Attention span and concentration are normal.  Able to name objects and repeat without trouble.  Fund of knowledge is appropriate Cranial nerves: There is good facial symmetry. The pupils are equal  round and reactive to light bilaterally. Fundoscopic exam reveals clear disc margins bilaterally. Extraocular muscles are intact and visual fields are full to confrontational testing. Speech is fluent and clear. Soft palate rises symmetrically and there is no tongue deviation. Hearing is intact to conversational tone. Tone: Tone is good throughout. Sensation: Sensation is intact to light touch and pinprick throughout (facial, trunk, extremities). Vibration is intact at the bilateral big toe. There is no extinction with double simultaneous stimulation. There is no sensory dermatomal level identified. Coordination:  The patient has no dysdiadichokinesia or dysmetria. Motor: Strength is 5/5 in the bilateral upper and lower extremities.  Shoulder shrug is equal bilaterally.  There is no pronator drift.  There are no fasciculations noted. DTR's: Deep tendon reflexes are 2/4 at the bilateral biceps, triceps, brachioradialis, patella and achilles.  Plantar responses are downgoing bilaterally. Gait and Station: The patient is able to ambulate without difficulty. The patient is able to heel toe walk without any difficulty. The patient is able to ambulate in a tandem fashion. The patient is able to stand in the Romberg position.   MOVEMENT EXAM: Tremor:  There is no tremor of the outstretched hands.  There is mild intention tremor with the hand.  There is a very rare head tremor in the "no" direction.  It does not increase with distraction.  There is no null point.  Labs:  Lab Results  Component Value Date   TSH 0.43 11/08/2016     Chemistry      Component Value Date/Time   NA 137 11/08/2016 1145   K 4.0 11/08/2016 1145   CL 101 11/08/2016 1145   CO2 31 11/08/2016 1145   BUN 20 11/08/2016 1145   CREATININE 1.30 11/08/2016 1145      Component Value Date/Time   CALCIUM 9.4 11/08/2016 1145   ALKPHOS 64 10/13/2015 0915   AST 14 10/13/2015 0915   ALT 12 10/13/2015 0915   BILITOT 1.0 10/13/2015 0915            Assessment/Plan:   1.  Essential Tremor.  -This involves only the head.  There is no evidence of cervical dystonia associated with this.  I told him I would not recommend any medication for this at this point in time.  This generally does not respond well to medication when only in the head.  If it gets worse (severe) there are surgical interventions, but only recommended when severe.  The head titubation generally does not even bother the patient, but is noticeable by his wife.   Reassurance was provided.  He will certainly let me know if something changes.  Otherwise, I will see the patient back on  an as-needed basis.  CC:  Nyoka Cowden, MD

## 2016-12-04 ENCOUNTER — Encounter: Payer: Self-pay | Admitting: Neurology

## 2016-12-04 ENCOUNTER — Ambulatory Visit (INDEPENDENT_AMBULATORY_CARE_PROVIDER_SITE_OTHER): Payer: BLUE CROSS/BLUE SHIELD | Admitting: Neurology

## 2016-12-04 VITALS — BP 110/72 | HR 73 | Ht 76.0 in | Wt 205.0 lb

## 2016-12-04 DIAGNOSIS — G250 Essential tremor: Secondary | ICD-10-CM

## 2017-04-14 ENCOUNTER — Other Ambulatory Visit: Payer: Self-pay | Admitting: Internal Medicine

## 2017-04-22 ENCOUNTER — Other Ambulatory Visit: Payer: Self-pay | Admitting: Internal Medicine

## 2017-05-12 ENCOUNTER — Other Ambulatory Visit: Payer: Self-pay | Admitting: Internal Medicine

## 2017-05-12 DIAGNOSIS — I10 Essential (primary) hypertension: Secondary | ICD-10-CM

## 2017-05-29 ENCOUNTER — Other Ambulatory Visit: Payer: Self-pay | Admitting: Internal Medicine

## 2017-09-16 ENCOUNTER — Encounter: Payer: Self-pay | Admitting: Internal Medicine

## 2017-09-16 ENCOUNTER — Ambulatory Visit: Payer: BLUE CROSS/BLUE SHIELD | Admitting: Internal Medicine

## 2017-09-16 VITALS — BP 120/82 | HR 78 | Ht 76.0 in | Wt 208.4 lb

## 2017-09-16 DIAGNOSIS — J069 Acute upper respiratory infection, unspecified: Secondary | ICD-10-CM

## 2017-09-16 MED ORDER — PREDNISONE 10 MG PO TABS: ORAL_TABLET | ORAL | 0 refills | Status: DC

## 2017-09-16 MED ORDER — BENZONATATE 200 MG PO CAPS: 200.0000 mg | ORAL_CAPSULE | Freq: Three times a day (TID) | ORAL | 1 refills | Status: DC | PRN

## 2017-09-16 NOTE — Patient Instructions (Addendum)
Add pepcid ac 20 mg after supper or at bedtime   GERD (REFLUX)  is an extremely common cause of respiratory symptoms just like yours , many times with no obvious heartburn at all.    It can be treated with medication, but also with lifestyle changes including elevation of the head of your bed (ideally with 6 inch  bed blocks),  Smoking cessation, avoidance of late meals, excessive alcohol, and avoid fatty foods, chocolate, peppermint, colas, red wine, and acidic juices such as orange juice.  NO MINT OR MENTHOL PRODUCTS SO NO COUGH DROPS   USE SUGARLESS CANDY INSTEAD (Jolley ranchers or Stover's or Life Savers) or even ice chips will also do - the key is to swallow to prevent all throat clearing. NO OIL BASED VITAMINS - use powdered substitutes.  Prednisone 10 mg take  4 each am x 2 days,   2 each am x 2 days,  1 each am x 2 days and stop   Delsym 2 tsp every 12 hours as needed   If not better > change to tessilon 200 mg three times a day as needed   Call if not better by this time next week

## 2017-09-16 NOTE — Progress Notes (Signed)
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   History of Present Illness  06/2008 One episode syncope eval by Dr Rosette Reveal > reduce toprol by half and leave off dexadrin  November 10, 2008 cpx no aerobics but no limitations and no presyncope. no change in rx.      05/15/2016 acute extended ov/Kaegan Hettich re: new onset epistaxis  Chief Complaint  Patient presents with  . Follow-up    over the last 2 weeks he has had spontanous nosebleeds, usually at night, dizziness when getting out of bed, appt with ENT is 05/30/16, blood pressure well controlled but lately his bp has been running high  acute onset avg qod worse lying down all from R side / took Asprin 05/13/16 > total amt of blood probaby one half cup since onset  Also daily light headedness standing  rec Increase tenex to 1 and a half daily and hold the aspirin until no bleeding and restart at 81 mg daily     11/08/2016  f/u ov/Sade Hollon re:  Back on baby asa no more nose bleeds  Chief Complaint  Patient presents with  . Follow-up    pt doing well today, no complaints.     head tremor new cc x sev years, slt worse, neg fm hx tremor rec Referred to neuro > Tat > observation    09/16/2017 acute extended ov/Anahis Furgeson re: new onset cough  Chief Complaint  Patient presents with  . Acute Visit    Cough x 1 wk- non prod, esp worse at night occ wakes him up.    acute onset cough assoc with nasal congestion , worse p lie down but present 24/7/ no better with clariton so far, no sob or wheeze or cp or fever   No obvious day to day or daytime variability or assoc excess/ purulent sputum or mucus plugs or hemoptysis or cp or chest tightness, subjective wheeze or overt sinus or hb symptoms.     Also denies any obvious fluctuation of symptoms with weather or  environmental changes or other aggravating or alleviating factors except as outlined above   No unusual exposure hx or h/o childhood pna/ asthma or knowledge of premature birth.  Current Allergies, Complete Past Medical History, Past Surgical History, Family History, and Social History were reviewed in [employer] [facility] record.  ROS  The following are not active complaints unless bolded Hoarseness, sore throat, dysphagia, dental problems, itching, sneezing,  nasal congestion or discharge of excess mucus or purulent secretions, ear ache,   fever, chills, sweats, unintended wt loss or wt gain, classically pleuritic or exertional cp,  orthopnea pnd or arm/hand swelling  or leg swelling, presyncope, palpitations, abdominal pain, anorexia, nausea, vomiting, diarrhea  or change in bowel habits or change in bladder habits, change in stools or change in urine, dysuria, hematuria,  rash, arthralgias, visual complaints, headache, numbness, weakness or ataxia or problems with walking or coordination,  change in mood or  memory.        Current Meds  Medication Sig  . amLODipine (NORVASC) 5 MG tablet TAKE 1 TABLET DAILY  . aspirin EC 81 MG tablet Take 1 tablet (81 mg total) by mouth daily.  . finasteride (PROSCAR) 5 MG tablet Take 5 mg  by mouth daily.    [name]. guanFACINE (TENEX) 1 MG tablet TAKE ONE AND ONE-HALF TABLETS AT BEDTIME  . loratadine (CLARITIN) 10 MG tablet Take 10 mg by mouth daily.  . metoprolol succinate (TOPROL-XL) 50 MG 24 hr tablet TAKE 1 TABLET DAILY. TAKE WITH OR IMMEDIATELY FOLLOWING A MEAL  . Tamsulosin HCl (FLOMAX) 0.4 MG CAPS Take 0.4 mg by mouth daily.    [name]. triamterene-hydrochlorothiazide (DYAZIDE) 37.5-25 MG capsule TAKE 1 CAPSULE DAILY             Past Medical History:  HYPERTENSION (ICD-401.9)  [name]  SYNCOPE (ICD-780.2)  DIVERTICULITIS OF COLON (ICD-562.11)..........................................................[name].[name]  -  colonoscopy 07/11/2000 > reminder letter sent 08/03/10> reminded 03/20/2012  Health Maintenance...............................................................................................[name].Kari Kerth  - DT November 10, 2008  -Prevnar 13 10/12/2014 , pneumoccal vaccine 09/2015  - CPX 11/08/2016  NEPHROLITHIASIS/BPH......................................................................................[name]. [name]  - prostatitis with noncaseating granulomatous inflammation July 2000 negative IPPD  ADULT ADD.........................................................................................................[name].[name]    Family History:  HBP/ kidney failure father  IHD with cabg at 2070 mother  one younger sister healthy no problems    Social History:  The patient lives at home with his wife. 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 He drinks 3  glasses of wine per week.  never smoker         Objective:   Physical Exam  amb wm nasal tone to voice   wt 208 > 207 November 10, 2008 >  > 02/21/2011  207> 03/20/2012  216 > 04/09/2013  215 > 10/12/2014 215 > 205 01/11/2015 > 10/13/2015  201 > 04/19/2016   206 > 05/15/2016  208 >  11/08/2016  200 > 09/16/2017  208      Vital signs reviewed - Note on arrival 02 sats  100% on RA  And bp 120/82        HEENT: nl dentition,  and oropharynx. Nl external ear canals without cough reflex- moderate bilateral non-specific turbinate edema     NECK :  without JVD/Nodes/TM/ nl carotid upstrokes bilaterally   LUNGS: no acc muscle use,  Nl contour chest which is clear to A and P bilaterally without cough on insp or exp maneuvers   CV:  RRR  no s3 or murmur or increase in P2, and no edema   ABD:  soft and nontender with nl inspiratory excursion in the supine position. No bruits or organomegaly appreciated, bowel sounds nl  MS:  Nl gait/ ext warm without deformities, calf tenderness, cyanosis or clubbing No obvious joint restrictions   SKIN: warm and dry without  lesions    NEURO:  alert, approp, nl sensorium with  no motor or cerebellar deficits apparent.      Assessment & Plan:

## 2017-09-18 ENCOUNTER — Encounter: Payer: Self-pay | Admitting: Internal Medicine

## 2017-09-18 NOTE — Assessment & Plan Note (Addendum)
Cough / nasal congestion main issues s purulent sputum wheezing or sinus pain so so far likely just viral s complications  Of the three most common causes of  Severe cough, only one (GERD)  can actually contribute to/ trigger  the other two (asthma and post nasal drip syndrome)  and perpetuate the cylce of cough.  While not intuitively obvious, many patients with chronic low grade reflux do not cough until there is a primary insult that disturbs the protective epithelial barrier and exposes sensitive nerve endings.   This is typically viral but can due to PNDS and  either may apply here.     The point is that once this occurs, it is difficult to eliminate the cycle  using anything but a maximally effective acid suppression regimen at least in the short run, accompanied by an appropriate diet to address non acid GERD and control / eliminate the cough itself for at least 3 days with delsym or tessalon if needed plus very short course prednisone and f/u in one week if not better   I had an extended discussion with the patient reviewing all relevant studies completed to date and  lasting 15 to 20 minutes of a 25 minute acute office visit    Each maintenance medication was reviewed in detail including most importantly the difference between maintenance and prns and under what circumstances the prns are to be triggered using an action plan format that is not reflected in the computer generated alphabetically organized AVS.    Please see AVS for specific instructions unique to this visit that I personally wrote and verbalized to the the pt in detail and then reviewed with pt  by my nurse highlighting any  changes in therapy recommended at today's visit to their plan of care.

## 2017-09-23 ENCOUNTER — Telehealth: Payer: Self-pay | Admitting: Internal Medicine

## 2017-09-23 MED ORDER — PREDNISONE 10 MG PO TABS: ORAL_TABLET | ORAL | 0 refills | Status: DC

## 2017-09-23 MED ORDER — CEFDINIR 300 MG PO CAPS: 300.0000 mg | ORAL_CAPSULE | Freq: Two times a day (BID) | ORAL | 0 refills | Status: DC

## 2017-09-23 NOTE — Telephone Encounter (Signed)
Prednisone 10 mg take  4 each am x 2 days,   2 each am x 2 days,  1 each am x 2 days and stop  omnicef 300 mg bid x 10 days (aware of pnc allergy but this was just a rash -verify that this is the case and that usually ok to take it but if develops rash stop right away as there is a remote risk he could also be allergic to [facility] which is the best choice here.

## 2017-09-23 NOTE — Telephone Encounter (Signed)
Spoke with pt. He is aware of MW's recommendations. Rxs have been sent in. Pt will let us know if he has any issues with taking Omnicef. Nothing further was needed.

## 2017-09-23 NOTE — Telephone Encounter (Signed)
Spoke with patient. He was seen by MW on 09/16/17 for URI. Patient stated that he felt better while on the prednisone but now that he is finished, the congestion has come back. This time the congestion is more so "in his head". Increased pressure around his eyes and nose. Nasal drainage is clear as well as phlegm. Denies any SOB, fevers or body aches.   He is currently taking Delsym with no relief. He just dropped the RX off for his Tessalon perles. He wants to know if he needs to be back on prednisone and perhaps an antibiotic.   Pharmacy is [facility] on [address].   MW, please advise. Thanks!

## 2017-10-12 ENCOUNTER — Other Ambulatory Visit: Payer: Self-pay | Admitting: Internal Medicine

## 2017-11-12 ENCOUNTER — Other Ambulatory Visit (INDEPENDENT_AMBULATORY_CARE_PROVIDER_SITE_OTHER): Payer: BLUE CROSS/BLUE SHIELD

## 2017-11-12 ENCOUNTER — Ambulatory Visit: Payer: BLUE CROSS/BLUE SHIELD | Admitting: Internal Medicine

## 2017-11-12 ENCOUNTER — Ambulatory Visit (INDEPENDENT_AMBULATORY_CARE_PROVIDER_SITE_OTHER)
Admission: RE | Admit: 2017-11-12 | Discharge: 2017-11-12 | Disposition: A | Discharge status: Home / Self Care | Payer: BLUE CROSS/BLUE SHIELD | Source: Ambulatory Visit | Attending: Internal Medicine | Admitting: Internal Medicine

## 2017-11-12 ENCOUNTER — Encounter: Payer: Self-pay | Admitting: Internal Medicine

## 2017-11-12 VITALS — BP 126/74 | HR 75 | Ht 76.0 in | Wt 205.0 lb

## 2017-11-12 DIAGNOSIS — I10 Essential (primary) hypertension: Secondary | ICD-10-CM

## 2017-11-12 DIAGNOSIS — E785 Hyperlipidemia, unspecified: Secondary | ICD-10-CM

## 2017-11-12 DIAGNOSIS — R251 Tremor, unspecified: Secondary | ICD-10-CM | POA: Diagnosis not present

## 2017-11-12 DIAGNOSIS — N400 Enlarged prostate without lower urinary tract symptoms: Secondary | ICD-10-CM

## 2017-11-12 LAB — CBC WITH DIFFERENTIAL/PLATELET
BASOS PCT: 1.2 % (ref 0.0–3.0)
Basophils Absolute: 0.1 10*3/uL (ref 0.0–0.1)
EOS PCT: 3.6 % (ref 0.0–5.0)
Eosinophils Absolute: 0.2 10*3/uL (ref 0.0–0.7)
HEMATOCRIT: 47.3 % (ref 39.0–52.0)
HEMOGLOBIN: 15.8 g/dL (ref 13.0–17.0)
LYMPHS PCT: 16.5 % (ref 12.0–46.0)
Lymphs Abs: 1 10*3/uL (ref 0.7–4.0)
MCHC: 33.4 g/dL (ref 30.0–36.0)
MCV: 86.8 fl (ref 78.0–100.0)
MONOS PCT: 11.8 % (ref 3.0–12.0)
Monocytes Absolute: 0.7 10*3/uL (ref 0.1–1.0)
NEUTROS ABS: 4.2 10*3/uL (ref 1.4–7.7)
Neutrophils Relative %: 66.9 % (ref 43.0–77.0)
PLATELETS: 315 10*3/uL (ref 150.0–400.0)
RBC: 5.45 Mil/uL (ref 4.22–5.81)
RDW: 14.6 % (ref 11.5–15.5)
WBC: 6.3 10*3/uL (ref 4.0–10.5)

## 2017-11-12 LAB — HEPATIC FUNCTION PANEL
ALBUMIN: 4.4 g/dL (ref 3.5–5.2)
ALT: 20 U/L (ref 0–53)
AST: 26 U/L (ref 0–37)
Alkaline Phosphatase: 66 U/L (ref 39–117)
BILIRUBIN TOTAL: 1.3 mg/dL — AB (ref 0.2–1.2)
Bilirubin, Direct: 0.2 mg/dL (ref 0.0–0.3)
Total Protein: 7.1 g/dL (ref 6.0–8.3)

## 2017-11-12 LAB — URINALYSIS
Bilirubin Urine: NEGATIVE
Hgb urine dipstick: NEGATIVE
Ketones, ur: NEGATIVE
Leukocytes, UA: NEGATIVE
Nitrite: NEGATIVE
PH: 6 (ref 5.0–8.0)
SPECIFIC GRAVITY, URINE: 1.02 (ref 1.000–1.030)
Total Protein, Urine: NEGATIVE
URINE GLUCOSE: NEGATIVE
Urobilinogen, UA: 0.2 (ref 0.0–1.0)

## 2017-11-12 LAB — LIPID PANEL
CHOL/HDL RATIO: 3
Cholesterol: 187 mg/dL (ref 0–200)
HDL: 63.6 mg/dL (ref >39.00)
LDL Cholesterol: 103 mg/dL — ABNORMAL HIGH (ref 0–99)
NONHDL: 123.3
TRIGLYCERIDES: 100 mg/dL (ref 0.0–149.0)
VLDL: 20 mg/dL (ref 0.0–40.0)

## 2017-11-12 LAB — BASIC METABOLIC PANEL
BUN: 17 mg/dL (ref 6–23)
CHLORIDE: 101 meq/L (ref 96–112)
CO2: 31 meq/L (ref 19–32)
Calcium: 10.1 mg/dL (ref 8.4–10.5)
Creatinine, Ser: 1.38 mg/dL (ref 0.40–1.50)
GFR: 54.28 mL/min — ABNORMAL LOW (ref >60.00)
GLUCOSE: 118 mg/dL — AB (ref 70–99)
POTASSIUM: 4.1 meq/L (ref 3.5–5.1)
SODIUM: 140 meq/L (ref 135–145)

## 2017-11-12 LAB — TSH: TSH: 0.6 u[IU]/mL (ref 0.35–4.50)

## 2017-11-12 NOTE — Progress Notes (Signed)
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   History of Present Illness  06/2008 One episode syncope eval by Dr [name] > reduce toprol by half and leave off dexadrin  November 10, 2008 cpx no aerobics but no limitations and no presyncope. no change in rx.      05/15/2016 acute extended ov/[name] re: new onset epistaxis  Chief Complaint  Patient presents with  . Follow-up    over the last 2 weeks he has had spontanous nosebleeds, usually at night, dizziness when getting out of bed, appt with ENT is 05/30/16, blood pressure well controlled but lately his bp has been running high  acute onset avg qod worse lying down all from R side / took Asprin 05/13/16 > total amt of blood probaby one half cup since onset  Also daily light headedness standing  rec Increase tenex to 1 and a half daily and hold the aspirin until no bleeding and restart at 81 mg daily     11/08/2016  f/u ov/[name] re:  Back on baby asa no more nose bleeds  Chief Complaint  Patient presents with  . Follow-up    pt doing well today, no complaints.     head tremor new cc x sev years, slt worse, neg fm hx tremor rec Referred to neuro > Tat > observation    09/16/2017 acute extended ov/[name] re: new onset cough  Chief Complaint  Patient presents with  . Acute Visit    Cough x 1 wk- non prod, esp worse at night occ wakes him up.    acute onset cough assoc with nasal congestion , worse p lie down but present 24/7/ no better with clariton so far, no sob or wheeze or cp or fever rec Add pepcid ac 20 mg after supper or at bedtime  GERD diet  Prednisone 10 mg take  4 each am x 2 days,   2 each am x 2 days,  1 each am x 2 days and stop  Delsym 2 tsp every 12 hours as needed  If not better > change to tessilon 200 mg three times a  day as needed  > resolved     11/12/2017  f/u ov/[name] re: cpx hbp / tremor about the same /palpitations  - cough resolved  Chief Complaint  Patient presents with  . Follow-up    Doing well at this time no problems  ex :   1.5 miles 4-5 times a week up some hills or up 5 flights if too hot or cold  =  35 min walking/ no cp/ no sob   No tia/claudication. No change tremor   No obvious day to day or daytime variability or assoc excess/ purulent sputum or mucus plugs or hemoptysis or cp or chest tightness, subjective wheeze or overt sinus or hb symptoms.   Sleeping flat/ one pillow  without nocturnal  or early am exacerbation  of respiratory  c/o's or need for noct saba. Also denies any obvious fluctuation of symptoms with weather or environmental changes or other aggravating or alleviating factors except as outlined above   No unusual exposure hx or h/o childhood pna/ asthma or knowledge of premature birth.  Current Allergies, Complete Past Medical History, Past Surgical History,  Family History, and Social History were reviewed in [employer] [facility] record.  ROS  The following are not active complaints unless bolded Hoarseness, sore throat, dysphagia, dental problems, itching, sneezing,  nasal congestion or discharge of excess mucus or purulent secretions, ear ache,   fever, chills, sweats, unintended wt loss or wt gain, classically pleuritic or exertional cp,  orthopnea pnd or arm/hand swelling  or leg swelling, presyncope, palpitations, abdominal pain, anorexia, nausea, vomiting, diarrhea  or change in bowel habits or change in bladder habits, change in stools or change in urine, dysuria, hematuria,  rash, arthralgias, visual complaints, headache, numbness, weakness or ataxia or problems with walking or coordination,  change in mood or  memory.        Current Meds  Medication Sig  . amLODipine (NORVASC) 5 MG tablet TAKE 1 TABLET DAILY  . aspirin EC 81 MG tablet Take 1 tablet  (81 mg total) by mouth daily.  . finasteride (PROSCAR) 5 MG tablet Take 5 mg by mouth daily.    [name] guanFACINE (TENEX) 1 MG tablet TAKE ONE AND ONE-HALF TABLETS AT BEDTIME  . metoprolol succinate (TOPROL-XL) 50 MG 24 hr tablet TAKE 1 TABLET DAILY. TAKE WITH OR IMMEDIATELY FOLLOWING A MEAL  . Tamsulosin HCl (FLOMAX) 0.4 MG CAPS Take 0.4 mg by mouth daily.    [name] triamterene-hydrochlorothiazide (DYAZIDE) 37.5-25 MG capsule TAKE 1 CAPSULE DAILY               Past Medical History:  HYPERTENSION (ICD-401.9)  [name]  SYNCOPE (ICD-780.2)  DIVERTICULITIS OF COLON (ICD-562.11)..........................................................Marland KitchenLina Briggs  - colonoscopy 07/11/2000 > reminder letter sent 08/03/10> reminded 03/20/2012  Health Maintenance...............................................................................................Marland KitchenWert  - DT November 10, 2008  -Prevnar 13 10/12/2014 , pneumoccal vaccine 09/2015  - CPX 11/12/2017  NEPHROLITHIASIS/BPH......................................................................................[name] Dr. Bjorn Briggs  - prostatitis with noncaseating granulomatous inflammation July 2000 negative IPPD  ADULT ADD.........................................................................................................Marland KitchenDr [name]    Family History:  HBP/ kidney failure father  IHD with cabg at 13 mother  one younger sister healthy no problems    Social History:  The patient lives at home with his wife. 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 He drinks 3  glasses of wine per week.  never smoker         Objective:   Physical Exam     wt 208 > 207 November 10, 2008 >  > 02/21/2011  207> 03/20/2012  216 > 04/09/2013  215 > 10/12/2014 215 > 205 01/11/2015 > 10/13/2015  201 > 04/19/2016   206 > 05/15/2016  208 >  11/08/2016  200 > 09/16/2017  208   > 11/12/2017  205   amb wm nad min head bob, slt ataxic speech   Vital signs reviewed - Note on arrival 02  sats  99% on RA   HEENT: nl dentition, turbinates bilaterally, and oropharynx. Nl external ear canals without cough reflex   NECK :  without JVD/Nodes/TM/ nl carotid upstrokes bilaterally   LUNGS: no acc muscle use,  Nl contour chest which is clear to A and P bilaterally without cough on insp or exp maneuvers   CV:  RRR  no s3 or murmur or increase in P2, and no edema  - fem and pedal pulses sym bilaterally   ABD:  soft and nontender with nl inspiratory excursion in the supine position. No bruits or organomegaly appreciated, bowel sounds nl  MS:  Nl gait/ ext warm without deformities, calf tenderness, cyanosis or clubbing No obvious joint restrictions  SKIN: warm and dry without lesions    NEURO:  alert, approp, nl sensorium with  no motor or cerebellar deficits apparent.   GU/ rectal:  Dr Annabell HowellsWrenn         CXR PA and Lateral:   11/12/2017 :    I personally reviewed images and agree with radiology impression as follows:   No active cardiopulmonary disease.    EKG 11/12/2017 :  NSR/ wnl              Assessment & Plan:

## 2017-11-12 NOTE — Patient Instructions (Signed)
No change in medications  Please remember to go to the lab and x-ray department downstairs in the basement  for your tests - we will call you with the results when they are available.     Please schedule a follow up visit in 6 months but call sooner if needed

## 2017-11-13 ENCOUNTER — Encounter: Payer: Self-pay | Admitting: Internal Medicine

## 2017-11-13 NOTE — Assessment & Plan Note (Signed)
F/u Dr Bjorn PippinJohn Briggs yearly - sooner prn symptoms advised

## 2017-11-13 NOTE — Assessment & Plan Note (Signed)
Onset 2014 / head bob only > eval by neuro 12/04/2016 > Dr  Tat >>> meds not recommended as not usually useful > f/u Dr Tat as planned

## 2017-11-13 NOTE — Assessment & Plan Note (Signed)
Lab Results  Component Value Date   CREATININE 1.38 11/12/2017   CREATININE 1.30 11/08/2016   CREATININE 1.31 04/19/2016     Finally Adequate control on present rx, reviewed in detail with pt > no change in rx needed

## 2017-11-13 NOTE — Assessment & Plan Note (Signed)
Lab Results  Component Value Date   CHOL 187 11/12/2017   HDL 63.60 11/12/2017   LDLCALC 103 (H) 11/12/2017   TRIG 100.0 11/12/2017   CHOLHDL 3 11/12/2017     Adequate control on present rx, reviewed in detail with pt > no change in rx needed  = diet/ ex

## 2018-02-25 DIAGNOSIS — H903 Sensorineural hearing loss, bilateral: Secondary | ICD-10-CM | POA: Insufficient documentation

## 2018-04-17 ENCOUNTER — Other Ambulatory Visit: Payer: Self-pay | Admitting: Internal Medicine

## 2018-04-28 ENCOUNTER — Telehealth: Payer: Self-pay | Admitting: Internal Medicine

## 2018-04-28 NOTE — Telephone Encounter (Addendum)
Called and spoke to pt.  Pt is wanting to know if or when he had a whooping cough vaccine.  Per our records, it does not appear that pt has had this vaccine.  Pt stated he would contact [facility] to see if they carry it.  Nothing further is needed.

## 2018-04-29 ENCOUNTER — Telehealth: Payer: Self-pay | Admitting: Internal Medicine

## 2018-04-29 NOTE — Telephone Encounter (Signed)
Called and spoke with Patient. He wanted to know if he had a recent tetanus or whooping cough vaccine.  Explained he had a T dap 11/10/2008, which covered tetanus, pertussis, and diphtheria. Understanding stated.  Nothing further at this time.

## 2018-05-08 ENCOUNTER — Other Ambulatory Visit: Payer: Self-pay | Admitting: Internal Medicine

## 2018-05-08 DIAGNOSIS — I10 Essential (primary) hypertension: Secondary | ICD-10-CM

## 2018-05-15 ENCOUNTER — Encounter: Payer: Self-pay | Admitting: Internal Medicine

## 2018-05-15 ENCOUNTER — Ambulatory Visit: Payer: BLUE CROSS/BLUE SHIELD | Admitting: Internal Medicine

## 2018-05-15 ENCOUNTER — Telehealth: Payer: Self-pay | Admitting: Internal Medicine

## 2018-05-15 VITALS — BP 114/72 | HR 78 | Ht 76.0 in | Wt 210.0 lb

## 2018-05-15 DIAGNOSIS — I10 Essential (primary) hypertension: Secondary | ICD-10-CM

## 2018-05-15 LAB — BASIC METABOLIC PANEL
BUN: 17 mg/dL (ref 6–23)
CALCIUM: 9.8 mg/dL (ref 8.4–10.5)
CO2: 30 meq/L (ref 19–32)
Chloride: 101 mEq/L (ref 96–112)
Creatinine, Ser: 1.36 mg/dL (ref 0.40–1.50)
GFR: 51.86 mL/min — ABNORMAL LOW (ref >60.00)
GLUCOSE: 112 mg/dL — AB (ref 70–99)
Potassium: 3.8 mEq/L (ref 3.5–5.1)
SODIUM: 139 meq/L (ref 135–145)

## 2018-05-15 NOTE — Progress Notes (Signed)
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     History of Present Illness  06/2008 One episode syncope eval by Dr Rosette Reveal > reduce toprol by half and leave off dexadrin  November 10, 2008 cpx no aerobics but no limitations and no presyncope. no change in rx.      05/15/2016 acute extended ov/[name] re: new onset epistaxis  Chief Complaint  Patient presents with  . Follow-up    over the last 2 weeks he has had spontanous nosebleeds, usually at night, dizziness when getting out of bed, appt with ENT is 05/30/16, blood pressure well controlled but lately his bp has been running high  acute onset avg qod worse lying down all from R side / took Asprin 05/13/16 > total amt of blood probaby one half cup since onset  Also daily light headedness standing  rec Increase tenex to 1 and a half daily and hold the aspirin until no bleeding and restart at 81 mg daily     11/08/2016  f/u ov/[name] re:  Back on baby asa no more nose bleeds  Chief Complaint  Patient presents with  . Follow-up    pt doing well today, no complaints.     head tremor new cc x sev years, slt worse, neg fm hx tremor rec Referred to neuro > Tat > observation     11/12/2017  f/u ov/[name] re: cpx hbp / tremor about the same /palpitations  - cough resolved  Chief Complaint  Patient presents with  . Follow-up    Doing well at this time no problems  ex :   1.5 miles 4-5 times a week up some hills or up 5 flights if too hot or cold  =  35 min walking/ no cp/ no sob  rec No change rx    05/15/2018  f/u ov/[name] re: hbp/  Chief Complaint  Patient presents with  . Follow-up    Pt c/o increased belching for the past several months.   Dyspnea:  Walking at lunch x 1.5 mile if good weather, does 5 flights x 30 min if bad Cough:  no Sleeping: no flat / 2 pillows   no tia /claudication     No obvious day to day or daytime variability or assoc excess/ purulent sputum or mucus plugs or hemoptysis or cp or chest tightness, subjective wheeze or overt sinus or hb symptoms.   Sleeping as above  without nocturnal  or early am exacerbation  of respiratory  c/o's or need for noct saba. Also denies any obvious fluctuation of symptoms with weather or environmental changes or other aggravating or alleviating factors except as outlined above   No unusual exposure hx or h/o childhood pna/ asthma or knowledge of premature birth.  Current Allergies, Complete Past Medical History, Past Surgical History, Family History, and Social History were reviewed in [employer] record.  ROS  The following are not active complaints unless bolded Hoarseness, sore throat, dysphagia, dental problems, itching, sneezing,  nasal congestion or discharge of excess mucus or purulent secretions, ear ache,   fever, chills, sweats, unintended wt loss or wt gain, classically pleuritic or exertional cp,  orthopnea pnd or arm/hand swelling  or leg swelling, presyncope, palpitations, abdominal pain, anorexia,  nausea, vomiting, diarrhea  or change in bowel habits or change in bladder habits, change in stools or change in urine, dysuria, hematuria,  rash, arthralgias, visual complaints, headache, numbness, weakness or ataxia or problems with walking or coordination,  change in mood or  memory.        Current Meds  Medication Sig  . amLODipine (NORVASC) 5 MG tablet TAKE 1 TABLET DAILY  . aspirin EC 81 MG tablet Take 1 tablet (81 mg total) by mouth daily.  . finasteride (PROSCAR) 5 MG tablet Take 5 mg by mouth daily.    [name]. guanFACINE (TENEX) 1 MG tablet TAKE ONE AND ONE-HALF TABLETS AT BEDTIME  . metoprolol succinate (TOPROL-XL) 50 MG 24 hr tablet TAKE 1 TABLET DAILY. TAKE WITH OR IMMEDIATELY FOLLOWING A MEAL  . Tamsulosin HCl (FLOMAX) 0.4 MG  CAPS Take 0.4 mg by mouth daily.    [name]. triamterene-hydrochlorothiazide (DYAZIDE) 37.5-25 MG capsule TAKE 1 CAPSULE DAILY             Past Medical History:  HYPERTENSION (ICD-401.9)  [name]  SYNCOPE (ICD-780.2)  DIVERTICULITIS OF COLON (ICD-562.11)..........................................................[name].[name]  - colonoscopy 07/11/2000 > reminder letter sent 08/03/10> reminded 03/20/2012  Health Maintenance...............................................................................................[name].[name]  - DT November 10, 2008  -Prevnar 13 10/12/2014 , pneumoccal vaccine 09/2015  - CPX 11/12/2017  NEPHROLITHIASIS/BPH......................................................................................[name]. [name]  - prostatitis with noncaseating granulomatous inflammation July 2000 negative IPPD  ADULT ADD.........................................................................................................[name].[name]    Family History:  HBP/ kidney failure father  IHD with cabg at 3370 mother  one younger sister healthy no problems    Social History:  The patient lives at home with his wife. 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 He drinks 3  glasses of wine per week.  never smoker         Objective:   Physical Exam     wt  207 November 10, 2008 >  > 02/21/2011  207>    11/08/2016  200 > 09/16/2017  208   > 11/12/2017  205 >  05/15/2018   210  Wt Readings from Last 3 Encounters:  05/15/18 210 lb (95.3 kg)  11/12/17 205 lb (93 kg)  09/16/17 208 lb 6.4 oz (94.5 kg)     Vital signs reviewed - Note on arrival 02 sats  98% on RA   HEENT: nl dentition, turbinates bilaterally, and oropharynx. Nl external ear canals without cough reflex   NECK :  without JVD/Nodes/TM/ nl carotid upstrokes bilaterally   LUNGS: no acc muscle use,  Nl contour chest which is clear to A and P bilaterally without cough on insp or exp maneuvers   CV:  RRR  no s3 or murmur or  increase in P2, and no edema   ABD:  soft and nontender with nl inspiratory excursion in the supine position. No bruits or organomegaly appreciated, bowel sounds nl  MS:  Nl gait/ ext warm without deformities, calf tenderness, cyanosis or clubbing No obvious joint restrictions   SKIN: warm and dry without lesions    NEURO:  alert, approp, nl sensorium with  no motor or cerebellar deficits apparent/ very slt tremor to voice, hands           Chemistry      Component Value Date/Time   NA 139 05/15/2018 0851   K 3.8 05/15/2018 0851   CL 101 05/15/2018 0851   CO2 30 05/15/2018 0851   BUN 17 05/15/2018 0851   CREATININE 1.36 05/15/2018 0851  Component Value Date/Time   CALCIUM 9.8 05/15/2018 0851                                              Assessment & Plan:

## 2018-05-15 NOTE — Progress Notes (Signed)
LMTCB

## 2018-05-15 NOTE — Telephone Encounter (Signed)
Advised pt of results. Pt understood and nothing further is needed.     Notes recorded by Nyoka Cowden, MD on 05/15/2018 at 3:11 PM EST Call patient : Studies are unremarkable, no change in recs

## 2018-05-15 NOTE — Patient Instructions (Signed)
Please remember to go to the lab department   for your tests - we will call you with the results when they are available.     Return for cpx end of august 2020

## 2018-05-18 ENCOUNTER — Encounter: Payer: Self-pay | Admitting: Internal Medicine

## 2018-05-18 NOTE — Assessment & Plan Note (Signed)
Lab Results  Component Value Date   CREATININE 1.36 05/15/2018   CREATININE 1.38 11/12/2017   CREATININE 1.30 11/08/2016     Adequate control on present rx, reviewed in detail with pt > no change in rx needed     F/u cpx due 10/2018

## 2018-09-29 ENCOUNTER — Encounter: Payer: Self-pay | Admitting: Podiatry

## 2018-09-29 ENCOUNTER — Ambulatory Visit (INDEPENDENT_AMBULATORY_CARE_PROVIDER_SITE_OTHER): Payer: BC Managed Care – PPO | Admitting: Podiatry

## 2018-09-29 ENCOUNTER — Other Ambulatory Visit: Payer: Self-pay

## 2018-09-29 ENCOUNTER — Ambulatory Visit (INDEPENDENT_AMBULATORY_CARE_PROVIDER_SITE_OTHER): Payer: BC Managed Care – PPO

## 2018-09-29 ENCOUNTER — Other Ambulatory Visit: Payer: Self-pay | Admitting: Podiatry

## 2018-09-29 VITALS — Temp 98.1°F

## 2018-09-29 DIAGNOSIS — M1000 Idiopathic gout, unspecified site: Secondary | ICD-10-CM

## 2018-09-29 DIAGNOSIS — M779 Enthesopathy, unspecified: Secondary | ICD-10-CM

## 2018-09-29 DIAGNOSIS — M79672 Pain in left foot: Secondary | ICD-10-CM

## 2018-09-29 MED ORDER — METHYLPREDNISOLONE 4 MG PO TBPK: ORAL_TABLET | ORAL | 0 refills | Status: DC

## 2018-09-29 NOTE — Progress Notes (Signed)
Subjective:   Patient ID: [name], male   DOB: 68 y.o.   MRN: [id number]   HPI Patient presents stating the big toe joint left is very inflamed and sore and patient states it is been going on around 4 days.  Patient thinks he has gout stating he has never had it before and has been utilizing elevation and ibuprofen and patient does not smoke likes to be active   Review of Systems  All other systems reviewed and are negative.       Objective:  Physical Exam Vitals signs and nursing note reviewed.  Constitutional:      Appearance: He is well-developed.  Pulmonary:     Effort: Pulmonary effort is normal.  Musculoskeletal: Normal range of motion.  Skin:    General: Skin is warm.  Neurological:     Mental Status: He is alert.     Neurovascular status intact muscle strength is adequate range of motion within normal limits.  Patient is found to have redness and inflammation pain of the first MPJ left with diminished range of motion noted and exquisite discomfort when the area is palpated.  Patient is found to have good digital perfusion and is well oriented x3     Assessment:  Probability for acute gout with inflammatory capsulitis of the first MPJ left foot     Plan:  H&P condition reviewed and today I did sterile prep and injected the first MPJ left 3 mg Kenalog 5 mg Xylocaine and advised him on a gout type diet.  I reviewed condition and patient will be seen back for [place] to recheck again in 2 weeks or earlier if needed and also placed on Medrol Dosepak  X-ray indicates there is structural bunion deformity left

## 2018-09-29 NOTE — Progress Notes (Signed)
   Subjective:    Patient ID: [name], male    DOB: [birth year], 68 y.o.   MRN: [id number]  HPI    Review of Systems  All other systems reviewed and are negative.      Objective:   Physical Exam        Assessment & Plan:

## 2018-09-29 NOTE — Patient Instructions (Signed)
Gout  Gout is painful swelling of your joints. Gout is a type of arthritis. It is caused by having too much uric acid in your body. Uric acid is a chemical that is made when your body breaks down substances called purines. If your body has too much uric acid, sharp crystals can form and build up in your joints. This causes pain and swelling. Gout attacks can happen quickly and be very painful (acute gout). Over time, the attacks can affect more joints and happen more often (chronic gout). What are the causes?  Too much uric acid in your blood. This can happen because: ? Your kidneys do not remove enough uric acid from your blood. ? Your body makes too much uric acid. ? You eat too many foods that are high in purines. These foods include organ meats, some seafood, and beer.  Trauma or stress. What increases the risk?  Having a family history of gout.  Being male and middle-aged.  Being male and having gone through menopause.  Being very overweight (obese).  Drinking alcohol, especially beer.  Not having enough water in the body (being dehydrated).  Losing weight too quickly.  Having an organ transplant.  Having lead poisoning.  Taking certain medicines.  Having kidney disease.  Having a skin condition called psoriasis. What are the signs or symptoms? An attack of acute gout usually happens in just one joint. The most common place is the big toe. Attacks often start at night. Other joints that may be affected include joints of the feet, ankle, knee, fingers, wrist, or elbow. Symptoms of an attack may include:  Very bad pain.  Warmth.  Swelling.  Stiffness.  Shiny, red, or purple skin.  Tenderness. The affected joint may be very painful to touch.  Chills and fever. Chronic gout may cause symptoms more often. More joints may be involved. You may also have white or yellow lumps (tophi) on your hands or feet or in other areas near your joints. How is this  treated?  Treatment for this condition has two phases: treating an acute attack and preventing future attacks.  Acute gout treatment may include: ? NSAIDs. ? Steroids. These are taken by mouth or injected into a joint. ? Colchicine. This medicine relieves pain and swelling. It can be given by mouth or through an IV tube.  Preventive treatment may include: ? Taking small doses of NSAIDs or colchicine daily. ? Using a medicine that reduces uric acid levels in your blood. ? Making changes to your diet. You may need to see a food expert (dietitian) about what to eat and drink to prevent gout. Follow these instructions at home: During a gout attack   If told, put ice on the painful area: ? Put ice in a plastic bag. ? Place a towel between your skin and the bag. ? Leave the ice on for 20 minutes, 2-3 times a day.  Raise (elevate) the painful joint above the level of your heart as often as you can.  Rest the joint as much as possible. If the joint is in your leg, you may be given crutches.  Follow instructions from your doctor about what you cannot eat or drink. Avoiding future gout attacks  Eat a low-purine diet. Avoid foods and drinks such as: ? Liver. ? Kidney. ? Anchovies. ? Asparagus. ? Herring. ? Mushrooms. ? Mussels. ? Beer.  Stay at a healthy weight. If you want to lose weight, talk with your doctor. Do not lose weight   too fast.  Start or continue an exercise plan as told by your doctor. Eating and drinking  Drink enough fluids to keep your pee (urine) pale yellow.  If you drink alcohol: ? Limit how much you use to:  0-1 drink a day for women.  0-2 drinks a day for men. ? Be aware of how much alcohol is in your drink. In the [place] one drink equals one 12 oz bottle of beer (355 mL), one 5 oz glass of wine (148 mL), or one 1 oz glass of hard liquor (44 mL). General instructions  Take over-the-counter and prescription medicines only as told by your doctor.  Do  not drive or use heavy machinery while taking prescription pain medicine.  Return to your normal activities as told by your doctor. Ask your doctor what activities are safe for you.  Keep all follow-up visits as told by your doctor. This is important. Contact a doctor if:  You have another gout attack.  You still have symptoms of a gout attack after 10 days of treatment.  You have problems (side effects) because of your medicines.  You have chills or a fever.  You have burning pain when you pee (urinate).  You have pain in your lower back or belly. Get help right away if:  You have very bad pain.  Your pain cannot be controlled.  You cannot pee. Summary  Gout is painful swelling of the joints.  The most common site of pain is the big toe, but it can affect other joints.  Medicines and avoiding some foods can help to prevent and treat gout attacks. This information is not intended to replace advice given to you by your health care provider. Make sure you discuss any questions you have with your health care provider. Document Released: 12/20/2007 Document Revised: 10/02/2017 Document Reviewed: 10/02/2017 Elsevier Patient Education  2020 Elsevier Inc.  

## 2018-10-09 ENCOUNTER — Other Ambulatory Visit: Payer: Self-pay | Admitting: Internal Medicine

## 2018-10-10 ENCOUNTER — Ambulatory Visit (INDEPENDENT_AMBULATORY_CARE_PROVIDER_SITE_OTHER): Payer: BC Managed Care – PPO | Admitting: Podiatry

## 2018-10-10 ENCOUNTER — Other Ambulatory Visit: Payer: Self-pay

## 2018-10-10 ENCOUNTER — Encounter: Payer: Self-pay | Admitting: Podiatry

## 2018-10-10 VITALS — Temp 98.1°F

## 2018-10-10 DIAGNOSIS — M779 Enthesopathy, unspecified: Secondary | ICD-10-CM

## 2018-10-10 DIAGNOSIS — M21619 Bunion of unspecified foot: Secondary | ICD-10-CM | POA: Diagnosis not present

## 2018-10-10 DIAGNOSIS — M1000 Idiopathic gout, unspecified site: Secondary | ICD-10-CM | POA: Diagnosis not present

## 2018-10-16 NOTE — Progress Notes (Signed)
Subjective:   Patient ID: [name], male   DOB: 68 y.o.   MRN: [id number]   HPI Patient presents stating that the left is doing fine with no pain and that it seems to have improved quite a bit with medication   ROS      Objective:  Physical Exam  Neurovascular status intact negative Homans sign noted with first MPJ left having significant reduction of redness swelling with no pain noted currently     Assessment:  Doing better from having had what appears to be acute gout attack that was a basic first-time event for this patient     Plan:  H&P reviewed the condition and at this point spent a great deal time going over gout and what to do if we were to see repetitive attacks and a modified diet currently we will not do any other changes and is necessary at one point in future we could get him on medication or consider other treatments we may be able to do.  Patient to be seen back for reevaluation as symptoms indicate

## 2018-11-14 ENCOUNTER — Ambulatory Visit (INDEPENDENT_AMBULATORY_CARE_PROVIDER_SITE_OTHER): Payer: BC Managed Care – PPO

## 2018-11-14 ENCOUNTER — Ambulatory Visit: Payer: BLUE CROSS/BLUE SHIELD | Admitting: Internal Medicine

## 2018-11-14 ENCOUNTER — Encounter: Payer: Self-pay | Admitting: Internal Medicine

## 2018-11-14 ENCOUNTER — Ambulatory Visit (INDEPENDENT_AMBULATORY_CARE_PROVIDER_SITE_OTHER): Payer: BC Managed Care – PPO | Admitting: Internal Medicine

## 2018-11-14 ENCOUNTER — Other Ambulatory Visit: Payer: Self-pay

## 2018-11-14 VITALS — BP 120/78 | HR 60 | Temp 97.1°F | Ht 76.0 in | Wt 203.4 lb

## 2018-11-14 DIAGNOSIS — Z0000 Encounter for general adult medical examination without abnormal findings: Secondary | ICD-10-CM

## 2018-11-14 DIAGNOSIS — E785 Hyperlipidemia, unspecified: Secondary | ICD-10-CM

## 2018-11-14 DIAGNOSIS — I10 Essential (primary) hypertension: Secondary | ICD-10-CM

## 2018-11-14 DIAGNOSIS — Z23 Encounter for immunization: Secondary | ICD-10-CM | POA: Diagnosis not present

## 2018-11-14 DIAGNOSIS — N181 Chronic kidney disease, stage 1: Secondary | ICD-10-CM

## 2018-11-14 DIAGNOSIS — N400 Enlarged prostate without lower urinary tract symptoms: Secondary | ICD-10-CM

## 2018-11-14 LAB — HEPATIC FUNCTION PANEL
ALT: 13 U/L (ref 0–53)
AST: 14 U/L (ref 0–37)
Albumin: 4.4 g/dL (ref 3.5–5.2)
Alkaline Phosphatase: 73 U/L (ref 39–117)
Bilirubin, Direct: 0.2 mg/dL (ref 0.0–0.3)
Total Bilirubin: 0.9 mg/dL (ref 0.2–1.2)
Total Protein: 7 g/dL (ref 6.0–8.3)

## 2018-11-14 LAB — LIPID PANEL
Cholesterol: 184 mg/dL (ref 0–200)
HDL: 51.8 mg/dL (ref >39.00)
LDL Cholesterol: 100 mg/dL — ABNORMAL HIGH (ref 0–99)
NonHDL: 132.64
Total CHOL/HDL Ratio: 4
Triglycerides: 162 mg/dL — ABNORMAL HIGH (ref 0.0–149.0)
VLDL: 32.4 mg/dL (ref 0.0–40.0)

## 2018-11-14 LAB — CBC WITH DIFFERENTIAL/PLATELET
Basophils Absolute: 0.1 10*3/uL (ref 0.0–0.1)
Basophils Relative: 1.1 % (ref 0.0–3.0)
Eosinophils Absolute: 0.2 10*3/uL (ref 0.0–0.7)
Eosinophils Relative: 2.4 % (ref 0.0–5.0)
HCT: 47.1 % (ref 39.0–52.0)
Hemoglobin: 15.7 g/dL (ref 13.0–17.0)
Lymphocytes Relative: 17.3 % (ref 12.0–46.0)
Lymphs Abs: 1.3 10*3/uL (ref 0.7–4.0)
MCHC: 33.3 g/dL (ref 30.0–36.0)
MCV: 87.5 fl (ref 78.0–100.0)
Monocytes Absolute: 0.6 10*3/uL (ref 0.1–1.0)
Monocytes Relative: 8.9 % (ref 3.0–12.0)
Neutro Abs: 5.1 10*3/uL (ref 1.4–7.7)
Neutrophils Relative %: 70.3 % (ref 43.0–77.0)
Platelets: 388 10*3/uL (ref 150.0–400.0)
RBC: 5.38 Mil/uL (ref 4.22–5.81)
RDW: 14.1 % (ref 11.5–15.5)
WBC: 7.3 10*3/uL (ref 4.0–10.5)

## 2018-11-14 LAB — BASIC METABOLIC PANEL
BUN: 18 mg/dL (ref 6–23)
CO2: 30 mEq/L (ref 19–32)
Calcium: 9.5 mg/dL (ref 8.4–10.5)
Chloride: 101 mEq/L (ref 96–112)
Creatinine, Ser: 1.32 mg/dL (ref 0.40–1.50)
GFR: 53.6 mL/min — ABNORMAL LOW (ref >60.00)
Glucose, Bld: 108 mg/dL — ABNORMAL HIGH (ref 70–99)
Potassium: 3.6 mEq/L (ref 3.5–5.1)
Sodium: 140 mEq/L (ref 135–145)

## 2018-11-14 LAB — TSH: TSH: 0.42 u[IU]/mL (ref 0.35–4.50)

## 2018-11-14 NOTE — Progress Notes (Signed)
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        History of Present Illness  06/2008 One episode syncope eval by Dr Beckie Salts > reduce toprol by half and leave off dexadrin  November 10, 2008 cpx no aerobics but no limitations and no presyncope. no change in rx.      05/15/2016 acute extended ov/[name] re: new onset epistaxis  Chief Complaint  Patient presents with  . Follow-up    over the last 2 weeks he has had spontanous nosebleeds, usually at night, dizziness when getting out of bed, appt with ENT is 05/30/16, blood pressure well controlled but lately his bp has been running high  acute onset avg qod worse lying down all from R side / took Asprin 05/13/16 > total amt of blood probaby one half cup since onset  Also daily light headedness standing  rec Increase tenex to 1 and a half daily and hold the aspirin until no bleeding and restart at 81 mg daily     11/08/2016  f/u ov/[name] re:  Back on baby asa no more nose bleeds  Chief Complaint  Patient presents with  . Follow-up    pt doing well today, no complaints.     head tremor new cc x sev years, slt worse, neg fm hx tremor rec Referred to neuro > Tat > observation     05/15/2018  f/u ov/[name] re: hbp/  Chief Complaint  Patient presents with  . Follow-up    Pt c/o increased belching for the past several months.   Dyspnea:  Walking at lunch x 1.5 mile if good weather, does 5 flights x 30 min if bad Cough: no Sleeping: no flat / 2 pillows  no tia /claudication rec Please remember to go to the lab department   for your tests - we will call you with the results when they are available. Return for cpx end of august 2020    11/14/2018  f/u ov/[name] re: cpx  hbp  Chief Complaint  Patient presents with  . Follow-up    Patient  reports that he is doing well at this time.  Dyspnea:  tol spin bike x 20 min almost daily s cp  Cough: no Sleeping: able to lie down flat comfortably  SABA use: no 02: no   No obvious day to day or daytime variability or assoc excess/ purulent sputum or mucus plugs or hemoptysis or cp or chest tightness, subjective wheeze or overt sinus or hb symptoms.   Sleeping  without nocturnal  or early am exacerbation  of respiratory  c/o's or need for noct saba. Also denies any obvious fluctuation of symptoms with weather or environmental changes or other aggravating or alleviating factors except as outlined above   No unusual exposure hx or h/o childhood pna/ asthma or knowledge of premature birth.  Current Allergies, Complete Past Medical History, Past Surgical History, Family History, and Social History were reviewed in [employer] record.  ROS  The following are not active complaints unless bolded Hoarseness, sore throat, dysphagia, dental problems, itching, sneezing,  nasal congestion or discharge of excess mucus or purulent secretions, ear ache,   fever, chills, sweats, unintended wt loss or wt gain, classically pleuritic or  exertional cp,  orthopnea pnd or arm/hand swelling  or leg swelling, presyncope, palpitations, abdominal pain, anorexia, nausea, vomiting, diarrhea  or change in bowel habits or change in bladder habits, change in stools or change in urine, dysuria, hematuria,  rash, arthralgias, visual complaints, headache, numbness, weakness or ataxia or problems with walking or coordination,  change in mood or  memory.        Current Meds  Medication Sig  . amLODipine (NORVASC) 5 MG tablet TAKE 1 TABLET DAILY  . aspirin EC 81 MG tablet Take 1 tablet (81 mg total) by mouth daily.  . finasteride (PROSCAR) 5 MG tablet Take 5 mg by mouth daily.    [name]. guanFACINE (TENEX) 1 MG tablet TAKE ONE AND ONE-HALF TABLETS AT BEDTIME  . metoprolol succinate (TOPROL-XL) 50 MG 24 hr  tablet TAKE 1 TABLET DAILY. TAKE WITH OR IMMEDIATELY FOLLOWING A MEAL  . Tamsulosin HCl (FLOMAX) 0.4 MG CAPS Take 0.4 mg by mouth daily.    [name]. triamterene-hydrochlorothiazide (DYAZIDE) 37.5-25 MG capsule TAKE 1 CAPSULE DAILY                            Past Medical History:  HYPERTENSION (ICD-401.9)  [name]  SYNCOPE (ICD-780.2)  DIVERTICULITIS OF COLON (ICD-562.11)..........................................................[name].[name]  - colonoscopy 07/11/2000 > reminder letter sent 08/03/10> reminded 03/20/2012  Health Maintenance...............................................................................................[name].Saniyah Mondesir  - DT November 10, 2008 and 11/14/2018  -Prevnar 13 10/12/2014 , pneumoccal vaccine 09/2015  - CPX 11/14/2018  NEPHROLITHIASIS/BPH......................................................................................[name]. [name]  - prostatitis with noncaseating granulomatous inflammation July 2000 negative IPPD  ADULT ADD.........................................................................................................[name].[name]    Family History:  HBP/ kidney failure father  IHD with cabg at 6570 mother  one younger sister healthy no problems    Social History:  The patient lives at home with his wife. 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 He drinks 3  glasses of wine per week.  never smoker         Objective:   Physical Exam    11/14/2018  203  wt  207 November 10, 2008 >  > 02/21/2011  207>    11/08/2016  200 > 09/16/2017  208   > 11/12/2017  205 >  05/15/2018   210  Wt Readings from Last 3 Encounters:  05/15/18 210 lb (95.3 kg)  11/12/17 205 lb (93 kg)  09/16/17 208 lb 6.4 oz (94.5 kg)    Vital signs reviewed - Note on arrival 02 sats  98% on RA and bp 120/78 p am meds/ pulse 60   HEENT: nl dentition, turbinates bilaterally, and oropharynx. Nl external ear canals without cough reflex   NECK :  without  JVD/Nodes/TM/ nl carotid upstrokes bilaterally   LUNGS: no acc muscle use,  Nl contour chest which is clear to A and P bilaterally without cough on insp or exp maneuvers   CV:  RRR  no s3 or murmur or increase in P2, and no edema   ABD:  soft and nontender with nl inspiratory excursion in the supine position. No bruits or organomegaly appreciated, bowel sounds nl  MS:  Nl gait/ ext warm without deformities, calf tenderness, cyanosis or clubbing No obvious joint restrictions   SKIN: warm and dry without lesions    NEURO:  alert, approp, nl sensorium with  no motor or cerebellar deficits apparent.   GU/ rectal per urology       Ekg 11/14/2018  Nsr/ no ischemic or hypertrophic changes  CXR PA and Lateral:   11/14/2018 :    I personally reviewed images and agree with radiology impression as follows:    No active cardiopulmonary disease.   Labs ordered/ reviewed:      Chemistry      Component Value Date/Time   NA 140 11/14/2018 1428   K 3.6 11/14/2018 1428   CL 101 11/14/2018 1428   CO2 30 11/14/2018 1428   BUN 18 11/14/2018 1428   CREATININE 1.32 11/14/2018 1428      Component Value Date/Time   CALCIUM 9.5 11/14/2018 1428   ALKPHOS 73 11/14/2018 1428   AST 14 11/14/2018 1428   ALT 13 11/14/2018 1428   BILITOT 0.9 11/14/2018 1428        Lab Results  Component Value Date   WBC 7.3 11/14/2018   HGB 15.7 11/14/2018   HCT 47.1 11/14/2018   MCV 87.5 11/14/2018   PLT 388.0 11/14/2018       EOS                                                              0.2                                     11/14/2018      Lab Results  Component Value Date   TSH 0.42 11/14/2018                     Assessment & Plan:

## 2018-11-14 NOTE — Patient Instructions (Signed)
Tdap today  Please remember to go to the lab and x-ray department   for your tests - we will call you with the results when they are available.     Please schedule a follow up visit in 6 months but call sooner if needed

## 2018-11-15 ENCOUNTER — Encounter: Payer: Self-pay | Admitting: Internal Medicine

## 2018-11-15 NOTE — Assessment & Plan Note (Signed)
Lab Results  Component Value Date   CREATININE 1.32 11/14/2018   CREATININE 1.36 05/15/2018   CREATININE 1.38 11/12/2017     Adequate control on present rx, reviewed in detail with pt > no change in rx needed

## 2018-11-15 NOTE — Assessment & Plan Note (Signed)
Target LDL < 130 male pt with hbp/ fm hx male with @ 68   Lab Results  Component Value Date   CHOL 184 11/14/2018   HDL 51.80 11/14/2018   LDLCALC 100 (H) 11/14/2018   TRIG 162.0 (H) 11/14/2018   CHOLHDL 4 11/14/2018    Adequate control on present rx, reviewed in detail with pt > no change in rx needed = diet/ ex

## 2018-11-15 NOTE — Assessment & Plan Note (Signed)
No change, likley related to low grade hypertenisve nephrosclerosis  >>>> Rx   Keep bp well controlled, monitor creat q 6 m

## 2018-11-15 NOTE — Assessment & Plan Note (Addendum)
UTD x needs Tdap > given   Also discussed optional shingles rx

## 2018-11-15 NOTE — Assessment & Plan Note (Addendum)
Followed by  Urology/ Dr Irine Seal    >>>>   F/u routinely with urology as planned

## 2018-11-17 NOTE — Progress Notes (Signed)
Spoke with pt and notified of results per Dr. [name]. Pt verbalized understanding and denied any questions. 

## 2019-01-12 ENCOUNTER — Other Ambulatory Visit: Payer: Self-pay

## 2019-01-12 ENCOUNTER — Ambulatory Visit (INDEPENDENT_AMBULATORY_CARE_PROVIDER_SITE_OTHER): Payer: BC Managed Care – PPO | Admitting: Podiatry

## 2019-01-12 ENCOUNTER — Encounter: Payer: Self-pay | Admitting: Podiatry

## 2019-01-12 DIAGNOSIS — M1000 Idiopathic gout, unspecified site: Secondary | ICD-10-CM

## 2019-01-12 DIAGNOSIS — M21619 Bunion of unspecified foot: Secondary | ICD-10-CM

## 2019-01-12 DIAGNOSIS — M779 Enthesopathy, unspecified: Secondary | ICD-10-CM

## 2019-01-12 DIAGNOSIS — M7752 Other enthesopathy of left foot: Secondary | ICD-10-CM

## 2019-01-12 NOTE — Progress Notes (Signed)
Subjective:   Patient ID: [name], male   DOB: 68 y.o.   MRN: [id number]   HPI Patient presents stating developing a lot of inflammation in the left big toe joint again and states that it did very well for 3 months and has started back and is sore with palpation   ROS      Objective:  Physical Exam  What appears to be inflammatory capsulitis of the first MPJ left with patient also noted to have gout of the most likely nature that has not been diagnosed up to this point     Assessment:  There is redness around the first MPJ left which is quite sore when palpated with fluid buildup     Plan:  Reviewed condition and at this point sterile prep and injected the first MPJ 3 mg dexamethasone Kenalog 5 mg Xylocaine and advised on gout and we are going to send for blood work with instructions given today for uric acid testing and arthritic profile and also discussed the possibility of a bunion deformity

## 2019-01-12 NOTE — Patient Instructions (Signed)
Gout  Gout is painful swelling of your joints. Gout is a type of arthritis. It is caused by having too much uric acid in your body. Uric acid is a chemical that is made when your body breaks down substances called purines. If your body has too much uric acid, sharp crystals can form and build up in your joints. This causes pain and swelling. Gout attacks can happen quickly and be very painful (acute gout). Over time, the attacks can affect more joints and happen more often (chronic gout). What are the causes?  Too much uric acid in your blood. This can happen because: ? Your kidneys do not remove enough uric acid from your blood. ? Your body makes too much uric acid. ? You eat too many foods that are high in purines. These foods include organ meats, some seafood, and beer.  Trauma or stress. What increases the risk?  Having a family history of gout.  Being male and middle-aged.  Being male and having gone through menopause.  Being very overweight (obese).  Drinking alcohol, especially beer.  Not having enough water in the body (being dehydrated).  Losing weight too quickly.  Having an organ transplant.  Having lead poisoning.  Taking certain medicines.  Having kidney disease.  Having a skin condition called psoriasis. What are the signs or symptoms? An attack of acute gout usually happens in just one joint. The most common place is the big toe. Attacks often start at night. Other joints that may be affected include joints of the feet, ankle, knee, fingers, wrist, or elbow. Symptoms of an attack may include:  Very bad pain.  Warmth.  Swelling.  Stiffness.  Shiny, red, or purple skin.  Tenderness. The affected joint may be very painful to touch.  Chills and fever. Chronic gout may cause symptoms more often. More joints may be involved. You may also have white or yellow lumps (tophi) on your hands or feet or in other areas near your joints. How is this  treated?  Treatment for this condition has two phases: treating an acute attack and preventing future attacks.  Acute gout treatment may include: ? NSAIDs. ? Steroids. These are taken by mouth or injected into a joint. ? Colchicine. This medicine relieves pain and swelling. It can be given by mouth or through an IV tube.  Preventive treatment may include: ? Taking small doses of NSAIDs or colchicine daily. ? Using a medicine that reduces uric acid levels in your blood. ? Making changes to your diet. You may need to see a food expert (dietitian) about what to eat and drink to prevent gout. Follow these instructions at home: During a gout attack   If told, put ice on the painful area: ? Put ice in a plastic bag. ? Place a towel between your skin and the bag. ? Leave the ice on for 20 minutes, 2-3 times a day.  Raise (elevate) the painful joint above the level of your heart as often as you can.  Rest the joint as much as possible. If the joint is in your leg, you may be given crutches.  Follow instructions from your doctor about what you cannot eat or drink. Avoiding future gout attacks  Eat a low-purine diet. Avoid foods and drinks such as: ? Liver. ? Kidney. ? Anchovies. ? Asparagus. ? Herring. ? Mushrooms. ? Mussels. ? Beer.  Stay at a healthy weight. If you want to lose weight, talk with your doctor. Do not lose weight   too fast.  Start or continue an exercise plan as told by your doctor. Eating and drinking  Drink enough fluids to keep your pee (urine) pale yellow.  If you drink alcohol: ? Limit how much you use to:  0-1 drink a day for women.  0-2 drinks a day for men. ? Be aware of how much alcohol is in your drink. In the [place] one drink equals one 12 oz bottle of beer (355 mL), one 5 oz glass of wine (148 mL), or one 1 oz glass of hard liquor (44 mL). General instructions  Take over-the-counter and prescription medicines only as told by your doctor.  Do  not drive or use heavy machinery while taking prescription pain medicine.  Return to your normal activities as told by your doctor. Ask your doctor what activities are safe for you.  Keep all follow-up visits as told by your doctor. This is important. Contact a doctor if:  You have another gout attack.  You still have symptoms of a gout attack after 10 days of treatment.  You have problems (side effects) because of your medicines.  You have chills or a fever.  You have burning pain when you pee (urinate).  You have pain in your lower back or belly. Get help right away if:  You have very bad pain.  Your pain cannot be controlled.  You cannot pee. Summary  Gout is painful swelling of the joints.  The most common site of pain is the big toe, but it can affect other joints.  Medicines and avoiding some foods can help to prevent and treat gout attacks. This information is not intended to replace advice given to you by your health care provider. Make sure you discuss any questions you have with your health care provider. Document Released: 12/20/2007 Document Revised: 10/02/2017 Document Reviewed: 10/02/2017 Elsevier Patient Education  2020 Elsevier Inc.  

## 2019-01-14 LAB — ANA, IFA COMPREHENSIVE PANEL
Anti Nuclear Antibody (ANA): POSITIVE — AB
ENA SM Ab Ser-aCnc: <1 AI
SM/RNP: <1 AI
SSA (Ro) (ENA) Antibody, IgG: <1 AI
SSB (La) (ENA) Antibody, IgG: <1 AI
Scleroderma (Scl-70) (ENA) Antibody, IgG: <1 AI
ds DNA Ab: 27 IU/mL — ABNORMAL HIGH

## 2019-01-14 LAB — SEDIMENTATION RATE: Sed Rate: 2 mm/h (ref 0–20)

## 2019-01-14 LAB — ANTI-NUCLEAR AB-TITER (ANA TITER): ANA Titer 1: 1:40 {titer} — ABNORMAL HIGH

## 2019-01-14 LAB — RHEUMATOID FACTOR: Rheumatoid fact SerPl-aCnc: <14 IU/mL (ref <14)

## 2019-01-14 LAB — URIC ACID: Uric Acid, Serum: 7.7 mg/dL (ref 4.0–8.0)

## 2019-01-14 LAB — C-REACTIVE PROTEIN: CRP: 1.2 mg/L (ref <8.0)

## 2019-01-15 ENCOUNTER — Ambulatory Visit: Payer: BC Managed Care – PPO | Admitting: Podiatry

## 2019-01-19 ENCOUNTER — Ambulatory Visit (INDEPENDENT_AMBULATORY_CARE_PROVIDER_SITE_OTHER): Payer: BC Managed Care – PPO | Admitting: Podiatry

## 2019-01-19 ENCOUNTER — Other Ambulatory Visit: Payer: Self-pay | Admitting: Podiatry

## 2019-01-19 ENCOUNTER — Encounter: Payer: Self-pay | Admitting: Podiatry

## 2019-01-19 ENCOUNTER — Other Ambulatory Visit: Payer: Self-pay

## 2019-01-19 ENCOUNTER — Ambulatory Visit (INDEPENDENT_AMBULATORY_CARE_PROVIDER_SITE_OTHER): Payer: BC Managed Care – PPO

## 2019-01-19 DIAGNOSIS — M1000 Idiopathic gout, unspecified site: Secondary | ICD-10-CM | POA: Diagnosis not present

## 2019-01-19 DIAGNOSIS — M21619 Bunion of unspecified foot: Secondary | ICD-10-CM

## 2019-01-19 DIAGNOSIS — M779 Enthesopathy, unspecified: Secondary | ICD-10-CM

## 2019-01-19 DIAGNOSIS — S9032XA Contusion of left foot, initial encounter: Secondary | ICD-10-CM

## 2019-01-19 MED ORDER — ALLOPURINOL 100 MG PO TABS: 100.0000 mg | ORAL_TABLET | Freq: Every day | ORAL | 6 refills | Status: DC

## 2019-01-19 NOTE — Progress Notes (Signed)
Subjective:   Patient ID: [name], male   DOB: 68 y.o.   MRN: [id number]   HPI Patient presents stating still has some redness around the joint and while it feels somewhat better still knows that it is there and did traumatize his left foot and is concerned about fracture   ROS      Objective:  Physical Exam  Neurovascular status intact with continued redness around the left first MPJ that is improved but is still sore with quite a bit of bruising the midfoot left after trauma which occurred several days ago     Assessment:  Still appears to have some vestige of gout issue left with trauma to the left forefoot     Plan:  H&P reviewed both conditions and at this point I recommended rigid bottom shoes discussed the bunion and I am going to go ahead and put him on allopurinol and is can talk to his doctor about getting off diuretic.  He will be seen back if symptoms persist I may need to consider other oral treatments and he does understand foods to be careful with.  Reviewed blood work indicating uric acid level is in high normal range with elevated ANA levels

## 2019-01-22 ENCOUNTER — Ambulatory Visit (INDEPENDENT_AMBULATORY_CARE_PROVIDER_SITE_OTHER): Payer: BC Managed Care – PPO | Admitting: Internal Medicine

## 2019-01-22 ENCOUNTER — Other Ambulatory Visit: Payer: Self-pay

## 2019-01-22 DIAGNOSIS — I10 Essential (primary) hypertension: Secondary | ICD-10-CM | POA: Diagnosis not present

## 2019-01-22 DIAGNOSIS — M1020 Drug-induced gout, unspecified site: Secondary | ICD-10-CM

## 2019-01-22 MED ORDER — FUROSEMIDE 20 MG PO TABS: 20.0000 mg | ORAL_TABLET | Freq: Every day | ORAL | 11 refills | Status: DC

## 2019-01-22 NOTE — Progress Notes (Signed)
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        History of Present Illness  06/2008 One episode syncope eval by Dr Rosette Reveal > reduce toprol by half and leave off dexadrin  November 10, 2008 cpx no aerobics but no limitations and no presyncope. no change in rx.      05/15/2016 acute extended ov/[name] re: new onset epistaxis  Chief Complaint  Patient presents with  . Follow-up    over the last 2 weeks he has had spontanous nosebleeds, usually at night, dizziness when getting out of bed, appt with ENT is 05/30/16, blood pressure well controlled but lately his bp has been running high  acute onset avg qod worse lying down all from R side / took Asprin 05/13/16 > total amt of blood probaby one half cup since onset  Also daily light headedness standing  rec Increase tenex to 1 and a half daily and hold the aspirin until no bleeding and restart at 81 mg daily     11/08/2016  f/u ov/[name] re:  Back on baby asa no more nose bleeds  Chief Complaint  Patient presents with  . Follow-up    pt doing well today, no complaints.     head tremor new cc x sev years, slt worse, neg fm hx tremor rec Referred to neuro > Tat > observation     05/15/2018  f/u ov/[name] re: hbp/  Chief Complaint  Patient presents with  . Follow-up    Pt c/o increased belching for the past several months.   Dyspnea:  Walking at lunch x 1.5 mile if good weather, does 5 flights x 30 min if bad Cough: no Sleeping: no flat / 2 pillows  no tia /claudication rec Please remember to go to the lab department   for your tests - we will call you with the results when they are available. Return for cpx end of august 2020    11/14/2018  f/u ov/[name] re: cpx  hbp  Chief Complaint  Patient presents with  . Follow-up    Patient  reports that he is doing well at this time.  Dyspnea:  tol spin bike x 20 min almost daily s cp  Cough: no Sleeping: able to lie down flat comfortably  SABA use: no 02: no rec No change rx   Virtual Visit via Telephone Note 01/22/2019   I connected with [name] on 01/22/19 at  2:30 PM EDT by telephone and verified that I am speaking with the correct person using two identifiers.   I discussed the limitations, risks, security and privacy concerns of performing an evaluation and management service by telephone and the availability of in person appointments. I also discussed with the patient that there may be a patient responsible charge related to this service. The patient expressed understanding and agreed to proceed.   History of Present Illness: gout flare on hctz  Dyspnea:  Not limited by breathing from desired activities   Cough: none  Sleeping: ok Gouty arthritis now on allopurinol 100 mg daily per podiatry     No obvious day to day or daytime variability or assoc excess/ purulent sputum or mucus plugs or hemoptysis or cp or chest tightness, subjective wheeze  or overt sinus or hb symptoms.    Also denies any obvious fluctuation of symptoms with weather or environmental changes or other aggravating or alleviating factors except as outlined above.   Meds reviewed/ med reconciliation completed         Observations/Objective: Sounds great, gout is gone    Assessment and Plan: See problem list for active a/p's   Follow Up Instructions: See avs for instructions unique to this ov which includes revised/ updated med list     I discussed the assessment and treatment plan with the patient. The patient was provided an opportunity to ask questions and all were answered. The patient agreed with the plan and demonstrated an understanding of the instructions.   The patient was advised to call back or seek an in-person evaluation if the symptoms worsen or if the condition  fails to improve as anticipated.  I provided 15 minutes of non-face-to-face time during this encounter.   Christinia Gully, MD       Past Medical History:  HYPERTENSION (ICD-401.9)  [name]  SYNCOPE (ICD-780.2)  DIVERTICULITIS OF COLON (ICD-562.11)..........................................................Marland KitchenDelfin Edis  - colonoscopy 07/11/2000 > reminder letter sent 08/03/10> reminded 03/20/2012  Health Maintenance...............................................................................................Marland KitchenWert  - DT November 10, 2008 and 11/14/2018  -Prevnar 13 10/12/2014 , pneumoccal vaccine 09/2015  - CPX 11/14/2018  NEPHROLITHIASIS/BPH......................................................................................[name] Dr. Irine Seal  - prostatitis with noncaseating granulomatous inflammation July 2000 negative IPPD  ADULT ADD.........................................................................................................Marland KitchenDr [employer]    Family History:  HBP/ kidney failure father  IHD with cabg at 47 mother  one younger sister healthy no problems    Social History:  The patient lives at home with his wife. 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 He drinks 3  glasses of wine per week.  never smoker

## 2019-01-22 NOTE — Patient Instructions (Signed)
Patient has my chart   Stop hctz containing diuretic and start furosemide 20 mg daily in its place   Eat more bananas fruits and vegetables  Please schedule a follow up office visit in 2 weeks, sooner if needed

## 2019-01-23 ENCOUNTER — Encounter: Payer: Self-pay | Admitting: Internal Medicine

## 2019-01-23 DIAGNOSIS — M1020 Drug-induced gout, unspecified site: Secondary | ICD-10-CM | POA: Insufficient documentation

## 2019-01-23 NOTE — Assessment & Plan Note (Signed)
hctz d/'d 01/22/2019    Each maintenance medication was reviewed in detail including most importantly the difference between maintenance and as needed and under what circumstances the prns are to be used.  Please see AVS for specific  Instructions which are unique to this visit and I personally typed out  which were reviewed in detail over the phone with the patient and a copy provided via my chart

## 2019-01-23 NOTE — Assessment & Plan Note (Signed)
-   gouty arthitis developed fall 2029 with UA = 7.7 on 01/12/19 while on hctz/ d/c'd 01/22/2019   >>>> change to lasix 20 mg q am / supplement with plenty of fruits/veggies and recheck K and UA and bp in a few weeks

## 2019-02-09 ENCOUNTER — Ambulatory Visit (INDEPENDENT_AMBULATORY_CARE_PROVIDER_SITE_OTHER): Payer: BC Managed Care – PPO | Admitting: Internal Medicine

## 2019-02-09 ENCOUNTER — Other Ambulatory Visit: Payer: Self-pay | Admitting: Internal Medicine

## 2019-02-09 ENCOUNTER — Encounter: Payer: Self-pay | Admitting: Internal Medicine

## 2019-02-09 ENCOUNTER — Other Ambulatory Visit: Payer: Self-pay

## 2019-02-09 DIAGNOSIS — I10 Essential (primary) hypertension: Secondary | ICD-10-CM | POA: Diagnosis not present

## 2019-02-09 DIAGNOSIS — M1020 Drug-induced gout, unspecified site: Secondary | ICD-10-CM | POA: Diagnosis not present

## 2019-02-09 LAB — HEPATIC FUNCTION PANEL
ALT: 18 U/L (ref 0–53)
AST: 18 U/L (ref 0–37)
Albumin: 4.2 g/dL (ref 3.5–5.2)
Alkaline Phosphatase: 73 U/L (ref 39–117)
Bilirubin, Direct: 0.2 mg/dL (ref 0.0–0.3)
Total Bilirubin: 0.7 mg/dL (ref 0.2–1.2)
Total Protein: 6.8 g/dL (ref 6.0–8.3)

## 2019-02-09 LAB — BASIC METABOLIC PANEL
BUN: 20 mg/dL (ref 6–23)
CO2: 31 mEq/L (ref 19–32)
Calcium: 9.3 mg/dL (ref 8.4–10.5)
Chloride: 102 mEq/L (ref 96–112)
Creatinine, Ser: 1.25 mg/dL (ref 0.40–1.50)
GFR: 57.04 mL/min — ABNORMAL LOW (ref >60.00)
Glucose, Bld: 105 mg/dL — ABNORMAL HIGH (ref 70–99)
Potassium: 4 mEq/L (ref 3.5–5.1)
Sodium: 140 mEq/L (ref 135–145)

## 2019-02-09 LAB — URIC ACID: Uric Acid, Serum: 6.2 mg/dL (ref 4.0–7.8)

## 2019-02-09 MED ORDER — ALLOPURINOL 300 MG PO TABS: 300.0000 mg | ORAL_TABLET | Freq: Every day | ORAL | 11 refills | Status: DC

## 2019-02-09 MED ORDER — PREDNISONE 10 MG PO TABS: ORAL_TABLET | ORAL | 0 refills | Status: DC

## 2019-02-09 NOTE — Progress Notes (Signed)
Spoke with pt and notified of results per Dr. [name]. Pt verbalized understanding and denied any questions. 

## 2019-02-09 NOTE — Patient Instructions (Addendum)
Please remember to go to the lab department   for your tests - we will call you with the results when they are available.     Goal is to keep your uric acid level below 6   Keep your previous appt 05/18/19

## 2019-02-09 NOTE — Progress Notes (Addendum)
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        Briggs of Present Illness  06/2008 One episode syncope eval by Tommy [name] > reduce toprol by half and leave off dexadrin  November 10, 2008 cpx no aerobics but no limitations and no presyncope. no change in rx.      05/15/2016 acute extended ov/[name] re: new onset epistaxis  Chief Complaint  Patient presents with  . Follow-up    over the last 2 weeks he has had spontanous nosebleeds, usually at night, dizziness when getting out of bed, appt with ENT is 05/30/16, blood pressure well controlled but lately his bp has been running high  acute onset avg qod worse lying down all from R side / took Asprin 05/13/16 > total amt of blood probaby one half cup since onset  Also daily light headedness standing  rec Increase tenex to 1 and a half daily and hold the aspirin until no bleeding and restart at 81 mg daily     11/08/2016  f/u ov/[name] re:  Back on baby asa no more nose bleeds  Chief Complaint  Patient presents with  . Follow-up    pt doing well today, no complaints.     head tremor new cc x sev years, slt worse, neg fm hx tremor rec Referred to neuro > [name] > observation     05/15/2018  f/u ov/[name] re: hbp/  Chief Complaint  Patient presents with  . Follow-up    Pt c/o increased belching for the past several months.   Dyspnea:  Walking at lunch x 1.5 mile if good weather, does 5 flights x 30 min if bad Cough: no Sleeping: no flat / 2 pillows  no tia /claudication rec Please remember to go to the lab department   for your tests - we will call you with the results when they are available. Return for cpx end of august 2020    11/14/2018  f/u ov/[name] re: cpx  hbp  Chief Complaint  Patient presents with  . Follow-up    Patient  reports that he is doing well at this time.  Dyspnea:  tol spin bike x 20 min almost daily s cp  Cough: no Sleeping: able to lie down flat comfortably  SABA use: no 02: no rec No change rx   01/22/2019 notified gout/ started allopurinol and stopped hctz   02/09/2019  f/u ov/[name] re:  advil x 3  Twice daily, maybe three Chief Complaint  Patient presents with  . Follow-up    gout flare great left toe   Dyspnea:  none Cough: none Sleeping: fine  SABA use: none  02: none  L great toe worse  X one week p initial resp to steroid injection and allopurinol at 100 mg daily/ off hctz x 2 weeks    No obvious day to day or daytime variability or assoc excess/ purulent sputum or mucus plugs or hemoptysis or cp or chest tightness, subjective wheeze or overt sinus or hb symptoms.   Sleeping  without nocturnal  or early am exacerbation  of respiratory  c/o's or need for noct saba. Also denies any obvious fluctuation of symptoms with weather or environmental changes or other aggravating or alleviating  factors except as outlined above   No unusual exposure hx or h/o childhood pna/ asthma or knowledge of premature birth.  Current Allergies, Complete Past Medical Briggs, Past Surgical Briggs, Family Briggs, and Social Briggs were reviewed in [name].  ROS  The following are not active complaints unless bolded Hoarseness, sore throat, dysphagia, dental problems, itching, sneezing,  nasal congestion or discharge of excess mucus or purulent secretions, ear ache,   fever, chills, sweats, unintended wt loss or wt gain, classically pleuritic or exertional cp,  orthopnea pnd or arm/hand swelling  or leg swelling, presyncope, palpitations, abdominal pain, anorexia, nausea, vomiting, diarrhea  or change in bowel habits or change in bladder habits, change in stools or change in urine, dysuria, hematuria,  rash, arthralgias, visual complaints, headache, numbness, weakness or  ataxia or problems with walking or coordination,  change in mood or  memory.        Current Meds  Medication Sig  . amLODipine (NORVASC) 5 MG tablet TAKE 1 TABLET DAILY  . aspirin EC 81 MG tablet Take 1 tablet (81 mg total) by mouth daily.  . finasteride (PROSCAR) 5 MG tablet Take 5 mg by mouth daily.    . furosemide (LASIX) 20 MG tablet Take 1 tablet (20 mg total) by mouth daily.  Tommy Kitchen. guanFACINE (TENEX) 1 MG tablet TAKE ONE AND ONE-HALF TABLETS AT BEDTIME  . metoprolol succinate (TOPROL-XL) 50 MG 24 hr tablet TAKE 1 TABLET DAILY. TAKE WITH OR IMMEDIATELY FOLLOWING A MEAL  . sildenafil (REVATIO) 20 MG tablet TAKE 1 TABLET PO DAILY PRN 1 TO 5 PO PRN  . Tamsulosin HCl (FLOMAX) 0.4 MG CAPS Take 0.4 mg by mouth daily.    . [DISCONTINUED] allopurinol (ZYLOPRIM) 100 MG tablet Take 1 tablet (100 mg total) by mouth daily.                             Past Medical Briggs:  HYPERTENSION (ICD-401.9)  GOUT on hctz 12/2018 .................................... Podiatry / Reg[facility] [name] (ICD-427.89)  SYNCOPE (ICD-780.2)  DIVERTICULITIS OF COLON (ICD-562.11)..........................................................Tommy Kitchen.[name]  Health Maintenance...............................................................................................[name]  - DT November 10, 2008 and 11/14/2018  -Prevnar 13 10/12/2014 , pneumoccal vaccine 09/2015  - CPX 11/14/2018  NEPHROLITHIASIS/BPH......................................................................................Tommy Kitchen. [name]  - prostatitis with noncaseating granulomatous inflammation July 2000 negative IPPD  ADULT ADD.........................................................................................................Tommy Kitchen.[name]:  HBP/ kidney failure father  IHD with cabg at 6770 mother  one younger sister healthy no problems    Social  Briggs:  The patient lives at home with his wife. 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 He drinks 3  glasses of wine per week.  never smoker         Objective:   Physical Exam    11/14/2018  203  wt  207 November 10, 2008 >  > 02/21/2011  207>    11/08/2016  200 > 09/16/2017  208   > 11/12/2017  205 >  05/15/2018   210 > 02/09/2019  210    05/15/18 210 lb (95.3 kg)  11/12/17 205 lb (93 kg)  09/16/17 208 lb 6.4 oz (94.5 kg)      BP 132/80 (BP Location: Left Arm, Cuff Size: Normal)   Pulse (!) 47   Temp (!) 97.2 F (36.2 C) (Temporal)   Ht 6\' 4"  (1.93 m)   Wt 210 lb (95.3 kg)  SpO2 100% Comment: on RA  BMI 25.56 kg/m      HEENT : pt wearing mask not removed for exam due to covid -19 concerns.    NECK :  without JVD/Nodes/TM/ nl carotid upstrokes bilaterally   LUNGS: no acc muscle use,  Nl contour chest which is clear to A and P bilaterally without cough on insp or exp maneuvers   CV:  RRR  no s3 or murmur or increase in P2, and no edema   ABD:  soft and nontender with nl inspiratory excursion in the supine position. No bruits or organomegaly appreciated, bowel sounds nl  MS:  Nl gait/ ext warm with mod swelling/ calor, erythema L great toe MTP joint s calf tenderness, cyanosis or clubbing No obvious joint restrictions   SKIN: warm and dry without lesions    NEURO:  alert, approp, nl sensorium with  no motor or cerebellar deficits apparent.            Chemistry      Component Value Date/Time   NA 140 02/09/2019 1202   K 4.0 02/09/2019 1202   CL 102 02/09/2019 1202   CO2 31 02/09/2019 1202   BUN 20 02/09/2019 1202   CREATININE 1.25 02/09/2019 1202      Component Value Date/Time   CALCIUM 9.3 02/09/2019 1202   ALKPHOS 73 02/09/2019 1202   AST 18 02/09/2019 1202   ALT 18 02/09/2019 1202   BILITOT 0.7 02/09/2019 1202       Uric Acid level  02/09/2019   = 6.2           Assessment & Plan:

## 2019-02-10 ENCOUNTER — Encounter: Payer: Self-pay | Admitting: Internal Medicine

## 2019-02-10 NOTE — Assessment & Plan Note (Signed)
Gout L great toe developed fall 2020 with UA = 7.7 on 01/12/19 while on hctz/ d/c'd 01/22/2019  And rx lasix 20 mg daily   Lab Results  Component Value Date   CREATININE 1.25 02/09/2019   CREATININE 1.32 11/14/2018   CREATININE 1.36 05/15/2018     Adequate control on present rx, reviewed in detail with pt > no change in rx needed

## 2019-02-10 NOTE — Assessment & Plan Note (Signed)
Gout L great toe developed fall 2020 with UA = 7.7 on 01/12/19 while on hctz/ d/c'd 01/22/2019 > level 6.2 on 02/09/2019 on allopurinol 100/diet so increased allopurinol to 300 mg daily   rec  Recheck in 4 weeks and in meantime for flare ok to try pred x 6 days and if not better refer to Rheumatology.   Discussed in detail all the  indications, usual  risks and alternatives  relative to the benefits with patient who agrees to proceed with rx as outlined.     > 50% of this 25 min spent in counseling   Each maintenance medication was reviewed in detail including most importantly the difference between maintenance and as needed and under what circumstances the prns are to be used.  Please see AVS for specific  Instructions which are unique to this visit and I personally typed out  which were reviewed in detail in writing with the patient and a copy provided.

## 2019-03-04 ENCOUNTER — Other Ambulatory Visit: Payer: Self-pay

## 2019-03-04 DIAGNOSIS — Z20822 Contact with and (suspected) exposure to covid-19: Secondary | ICD-10-CM

## 2019-03-06 LAB — NOVEL CORONAVIRUS, NAA: SARS-CoV-2, NAA: NOT DETECTED

## 2019-03-12 ENCOUNTER — Other Ambulatory Visit: Payer: Self-pay | Admitting: Internal Medicine

## 2019-03-12 ENCOUNTER — Other Ambulatory Visit (INDEPENDENT_AMBULATORY_CARE_PROVIDER_SITE_OTHER): Payer: BC Managed Care – PPO

## 2019-03-12 DIAGNOSIS — M1020 Drug-induced gout, unspecified site: Secondary | ICD-10-CM

## 2019-03-12 LAB — URIC ACID: Uric Acid, Serum: 5.4 mg/dL (ref 4.0–7.8)

## 2019-03-12 LAB — HEPATIC FUNCTION PANEL
ALT: 13 U/L (ref 0–53)
AST: 12 U/L (ref 0–37)
Albumin: 4 g/dL (ref 3.5–5.2)
Alkaline Phosphatase: 70 U/L (ref 39–117)
Bilirubin, Direct: 0.2 mg/dL (ref 0.0–0.3)
Total Bilirubin: 0.7 mg/dL (ref 0.2–1.2)
Total Protein: 6.6 g/dL (ref 6.0–8.3)

## 2019-03-12 NOTE — Telephone Encounter (Signed)
Dr. Melvyn Novas please advise on pt's email. Informed him already of the Uric acid results per your result note. Pt has question regarding the Lasix. Thanks.

## 2019-04-06 ENCOUNTER — Other Ambulatory Visit: Payer: Self-pay | Admitting: Internal Medicine

## 2019-04-06 MED ORDER — FUROSEMIDE 20 MG PO TABS: 20.0000 mg | ORAL_TABLET | Freq: Every day | ORAL | 3 refills | Status: DC

## 2019-04-15 ENCOUNTER — Ambulatory Visit: Payer: BC Managed Care – PPO | Attending: Internal Medicine

## 2019-04-15 DIAGNOSIS — Z23 Encounter for immunization: Secondary | ICD-10-CM

## 2019-04-15 NOTE — Progress Notes (Signed)
   Covid-19 Vaccination Clinic  Name:  [name]    MRN: [id number] DOB: [birth year]  04/15/2019  [name] was observed post Covid-19 immunization for 15 minutes without incidence. He was provided with Vaccine Information Sheet and instruction to access the V-Safe system.   [name] was instructed to call 911 with any severe reactions post vaccine: [name] Difficulty breathing  . Swelling of your face and throat  . A fast heartbeat  . A bad rash all over your body  . Dizziness and weakness    Immunizations Administered    Name Date Dose VIS Date Route   Pfizer COVID-19 Vaccine 04/15/2019  5:39 PM 0.3 mL 03/06/2019 Intramuscular   Manufacturer: [employer], [employer]   Lot: [id number]   NDC: [address]-7

## 2019-05-06 ENCOUNTER — Ambulatory Visit: Payer: BC Managed Care – PPO | Attending: Internal Medicine

## 2019-05-06 DIAGNOSIS — Z23 Encounter for immunization: Secondary | ICD-10-CM | POA: Insufficient documentation

## 2019-05-06 NOTE — Progress Notes (Signed)
   Covid-19 Vaccination Clinic  Name:  [name]    MRN: [id number] DOB: [birth year]  05/06/2019  [name] was observed post Covid-19 immunization for 15 minutes without incidence. He was provided with Vaccine Information Sheet and instruction to access the V-Safe system.   [name] was instructed to call 911 with any severe reactions post vaccine: [name] Difficulty breathing  . Swelling of your face and throat  . A fast heartbeat  . A bad rash all over your body  . Dizziness and weakness    Immunizations Administered    Name Date Dose VIS Date Route   Pfizer COVID-19 Vaccine 05/06/2019  1:33 PM 0.3 mL 03/06/2019 Intramuscular   Manufacturer: [employer], [employer]   Lot: [id number]   NDC: [address]-5

## 2019-05-12 ENCOUNTER — Encounter: Payer: Self-pay | Admitting: Internal Medicine

## 2019-05-12 ENCOUNTER — Ambulatory Visit (INDEPENDENT_AMBULATORY_CARE_PROVIDER_SITE_OTHER): Payer: BC Managed Care – PPO | Admitting: Internal Medicine

## 2019-05-12 ENCOUNTER — Other Ambulatory Visit: Payer: Self-pay

## 2019-05-12 DIAGNOSIS — M1020 Drug-induced gout, unspecified site: Secondary | ICD-10-CM

## 2019-05-12 DIAGNOSIS — I10 Essential (primary) hypertension: Secondary | ICD-10-CM

## 2019-05-12 LAB — HEPATIC FUNCTION PANEL
ALT: 12 U/L (ref 0–53)
AST: 13 U/L (ref 0–37)
Albumin: 3.9 g/dL (ref 3.5–5.2)
Alkaline Phosphatase: 77 U/L (ref 39–117)
Bilirubin, Direct: 0.2 mg/dL (ref 0.0–0.3)
Total Bilirubin: 0.8 mg/dL (ref 0.2–1.2)
Total Protein: 6.6 g/dL (ref 6.0–8.3)

## 2019-05-12 LAB — BASIC METABOLIC PANEL
BUN: 21 mg/dL (ref 6–23)
CO2: 31 mEq/L (ref 19–32)
Calcium: 9.6 mg/dL (ref 8.4–10.5)
Chloride: 103 mEq/L (ref 96–112)
Creatinine, Ser: 1.26 mg/dL (ref 0.40–1.50)
GFR: 56.48 mL/min — ABNORMAL LOW (ref >60.00)
Glucose, Bld: 111 mg/dL — ABNORMAL HIGH (ref 70–99)
Potassium: 4.3 mEq/L (ref 3.5–5.1)
Sodium: 138 mEq/L (ref 135–145)

## 2019-05-12 LAB — URIC ACID: Uric Acid, Serum: 5.4 mg/dL (ref 4.0–7.8)

## 2019-05-12 NOTE — Patient Instructions (Addendum)
Please remember to go to the lab department   for your tests - we will call you with the results when they are available.   Ok to hold lasix or take one half daily    Please schedule a follow up visit in 6  months but call sooner if needed

## 2019-05-12 NOTE — Progress Notes (Signed)
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        History of Present Illness  06/2008 One episode syncope eval by Dr Beckie Salts > reduce toprol by half and leave off dexadrin  November 10, 2008 cpx no aerobics but no limitations and no presyncope. no change in rx.      05/15/2016 acute extended ov/Clemon Devaul re: new onset epistaxis  Chief Complaint  Patient presents with  . Follow-up    over the last 2 weeks he has had spontanous nosebleeds, usually at night, dizziness when getting out of bed, appt with ENT is 05/30/16, blood pressure well controlled but lately his bp has been running high  acute onset avg qod worse lying down all from R side / took Asprin 05/13/16 > total amt of blood probaby one half cup since onset  Also daily light headedness standing  rec Increase tenex to 1 and a half daily and hold the aspirin until no bleeding and restart at 81 mg daily     11/08/2016  f/u ov/Delle Andrzejewski re:  Back on baby asa no more nose bleeds  Chief Complaint  Patient presents with  . Follow-up    pt doing well today, no complaints.     head tremor new cc x sev years, slt worse, neg fm hx tremor rec Referred to neuro > Tat > observation     05/15/2018  f/u ov/[place] Zou re: hbp/  Chief Complaint  Patient presents with  . Follow-up    Pt c/o increased belching for the past several months.   Dyspnea:  Walking at lunch x 1.5 mile if good weather, does 5 flights x 30 min if bad Cough: no Sleeping: no flat / 2 pillows  no tia /claudication rec Please remember to go to the lab department   for your tests - we will call you with the results when they are available. Return for cpx end of august 2020    11/14/2018  f/u ov/Porter Nakama re: cpx  hbp  Chief Complaint  Patient presents with  . Follow-up    Patient  reports that he is doing well at this time.  Dyspnea:  tol spin bike x 20 min almost daily s cp  Cough: no Sleeping: able to lie down flat comfortably  SABA use: no 02: no rec No change rx   01/22/2019 notified gout/ started allopurinol and stopped hctz   02/09/2019  f/u ov/Lunah Losasso re:  advil x 3  Twice daily, maybe three Chief Complaint  Patient presents with  . Follow-up    gout flare great left toe   Dyspnea:  none Cough: none Sleeping: fine  SABA use: none  02: none  L great toe worse  X one week p initial resp to steroid injection and allopurinol at 100 mg daily/ off hctz x 2 weeks  rec Please remember to go to the lab department   for your tests - we will call you with the results when they are available. Goal is to keep your uric acid level below 6  Keep your previous appt 05/18/19     05/12/2019  f/u ov/Edith Lord re:  Hbp, gout better,no more steroid injections or nsaid need  Chief Complaint  Patient presents with  . Follow-up  Doing well and no new co's- still has not used pred taper given in case gout flare.   Dyspnea:  Not limited by breathing from desired activities  But very sedentary Cough: none  Sleeping: some snoring on back / no hypersomnolence  SABA use: none 02: none    No obvious day to day or daytime variability or assoc excess/ purulent sputum or mucus plugs or hemoptysis or cp or chest tightness, subjective wheeze or overt sinus or hb symptoms.   Sleeping as above  without nocturnal  or early am exacerbation  of respiratory  c/o's or need for noct saba. Also denies any obvious fluctuation of symptoms with weather or environmental changes or other aggravating or alleviating factors except as outlined above   No unusual exposure hx or h/o childhood pna/ asthma or knowledge of premature birth.  Current Allergies, Complete Past Medical History, Past Surgical History, Family History, and Social History were reviewed in [employer]  record.  ROS  The following are not active complaints unless bolded Hoarseness, sore throat, dysphagia, dental problems, itching, sneezing,  nasal congestion or discharge of excess mucus or purulent secretions, ear ache,   fever, chills, sweats, unintended wt loss or wt gain, classically pleuritic or exertional cp,  orthopnea pnd or arm/hand swelling  or leg swelling, presyncope, palpitations, abdominal pain, anorexia, nausea, vomiting, diarrhea  or change in bowel habits or change in bladder habits, change in stools or change in urine, dysuria, hematuria,  rash, arthralgias, visual complaints, headache, numbness, weakness or ataxia or problems with walking or coordination,  change in mood or  memory.        Current Meds  Medication Sig  . allopurinol (ZYLOPRIM) 300 MG tablet Take 1 tablet (300 mg total) by mouth daily.  [name] amLODipine (NORVASC) 5 MG tablet TAKE 1 TABLET DAILY  . aspirin EC 81 MG tablet Take 1 tablet (81 mg total) by mouth daily.  . finasteride (PROSCAR) 5 MG tablet Take 5 mg by mouth daily.    . furosemide (LASIX) 20 MG tablet Take 1 tablet (20 mg total) by mouth daily.  [name] guanFACINE (TENEX) 1 MG tablet TAKE ONE AND ONE-HALF TABLETS AT BEDTIME  . metoprolol succinate (TOPROL-XL) 50 MG 24 hr tablet TAKE 1 TABLET DAILY. TAKE WITH OR IMMEDIATELY FOLLOWING A MEAL  . Multiple Vitamins-Minerals (VITAMIN D3 COMPLETE PO) Take 2,000 Units by mouth daily.  . sildenafil (REVATIO) 20 MG tablet TAKE 1 TABLET PO DAILY PRN 1 TO 5 PO PRN  . Tamsulosin HCl (FLOMAX) 0.4 MG CAPS Take 0.4 mg by mouth daily.                       Past Medical History:  HYPERTENSION (ICD-401.9)  GOUT on hctz 12/2018 .................................... Podiatry / [facility] [name]  SYNCOPE (ICD-780.2)  DIVERTICULITIS OF COLON (ICD-562.11)..........................................................Marland KitchenLina Sar  - colonoscopy 07/11/2000 > reminder letter sent 08/03/10> reminded 03/20/2012   Health Maintenance...............................................................................................Marland KitchenWert  - DT November 10, 2008 and 11/14/2018  -Prevnar 13 10/12/2014 , pneumoccal vaccine 09/2015  - CPX 11/14/2018  NEPHROLITHIASIS/BPH......................................................................................[name] Dr. [name]  - prostatitis with noncaseating granulomatous inflammation July 2000 negative IPPD  ADULT ADD.........................................................................................................Marland KitchenDr [name]    Family History:  HBP/ kidney failure father  IHD with cabg at 85 mother  one younger sister healthy no problems    Social History:  The patient lives at home with his wife. 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 He drinks 3  glasses of wine  per week.  never smoker         Objective:   Physical Exam  amb pleasant wm nad    05/12/2019  207  11/14/2018  203  wt  207 November 10, 2008 >  > 02/21/2011  207>    11/08/2016  200 > 09/16/2017  208   > 11/12/2017  205 >  05/15/2018   210 > 02/09/2019  210    05/15/18 210 lb (95.3 kg)  11/12/17 205 lb (93 kg)  09/16/17 208 lb 6.4 oz (94.5 kg)       Vital signs reviewed  05/12/2019  - Note at rest 02 sats  98% on RA      HEENT : pt wearing mask not removed for exam due to covid -19 concerns.    NECK :  without JVD/Nodes/TM/ nl carotid upstrokes bilaterally   LUNGS: no acc muscle use,  Nl contour chest which is clear to A and P bilaterally without cough on insp or exp maneuvers   CV:  RRR  no s3 or murmur or increase in P2, and no edema   ABD:  soft and nontender with nl inspiratory excursion in the supine position. No bruits or organomegaly appreciated, bowel sounds nl  MS:  Nl gait/ ext warm without deformities, calf tenderness, cyanosis or clubbing No obvious joint restrictions   SKIN: warm and dry without lesions    NEURO:  alert, approp, nl sensorium with  no motor or cerebellar  deficits apparent.        Labs ordered/ reviewed:      Chemistry      Component Value Date/Time   NA 138 05/12/2019 1024   K 4.3 05/12/2019 1024   CL 103 05/12/2019 1024   CO2 31 05/12/2019 1024   BUN 21 05/12/2019 1024   CREATININE 1.26 05/12/2019 1024      Component Value Date/Time   CALCIUM 9.6 05/12/2019 1024   ALKPHOS 77 05/12/2019 1024   AST 13 05/12/2019 1024   ALT 12 05/12/2019 1024   BILITOT 0.8 05/12/2019 1024         Lab Results  Component Value Date   LABURIC 5.4 05/12/2019            Assessment & Plan:

## 2019-05-12 NOTE — Assessment & Plan Note (Signed)
Gout L great toe developed 7/ 2020 with UA = 7.7 on 01/12/19 while on hctz/ d/c'd 01/22/2019 > level 6.2 on 02/09/2019 on allopurinol 100/diet so increased allopurinol to 300 mg daily > level 5.4 05/12/2019 so no change rx   Adequate control on present rx, reviewed in detail with pt > no change in rx needed            Each maintenance medication was reviewed in detail including emphasizing most importantly the difference between maintenance and prns and under what circumstances the prns are to be triggered using an action plan format where appropriate.  Total time for H and P, chart review, counseling  and generating customized AVS unique to this office visit / charting = 20 min

## 2019-05-12 NOTE — Assessment & Plan Note (Addendum)
   Lab Results  Component Value Date   CREATININE 1.26 05/12/2019   CREATININE 1.25 02/09/2019   CREATININE 1.32 11/14/2018     May actually be over treated now vs hctz so ok to hold lasix or just take Lasix 20 mg one half daily as long as monitor bp with change

## 2019-05-18 ENCOUNTER — Ambulatory Visit: Payer: BC Managed Care – PPO | Admitting: Internal Medicine

## 2019-05-18 NOTE — Telephone Encounter (Signed)
MW please advise: Pt is concerned about prolonged elevated glucose levels, is requesting your recs.   6 days ago: 111 3 mos ago: 105 6 mos ago: 41  (this is a PCP pt) please advise if this is concerning and if anything needs to be done regarding this.  Thanks!

## 2019-05-26 ENCOUNTER — Telehealth: Payer: Self-pay | Admitting: *Deleted

## 2019-05-26 NOTE — Telephone Encounter (Signed)
I informed pt that if Dr. Sherene Sires prescribed for the gout, then to take the prednisone. Pt asked how to take Ibuprofen and it told him the OTC was 2 tablets every 4-6 hours but not to take with the prednisone, take regular strength tylenol as package instructs./

## 2019-05-26 NOTE — Telephone Encounter (Signed)
Pt called states he is having a gout flare and has an appt 06/01/2019, Dr. Sherene Sires had prescribed a prednisone for his gout flares and he wanted to know if he should take it.

## 2019-06-01 ENCOUNTER — Ambulatory Visit (INDEPENDENT_AMBULATORY_CARE_PROVIDER_SITE_OTHER): Payer: BC Managed Care – PPO

## 2019-06-01 ENCOUNTER — Other Ambulatory Visit: Payer: Self-pay

## 2019-06-01 ENCOUNTER — Encounter: Payer: Self-pay | Admitting: Podiatry

## 2019-06-01 ENCOUNTER — Other Ambulatory Visit: Payer: Self-pay | Admitting: Podiatry

## 2019-06-01 ENCOUNTER — Ambulatory Visit (INDEPENDENT_AMBULATORY_CARE_PROVIDER_SITE_OTHER): Payer: BC Managed Care – PPO | Admitting: Podiatry

## 2019-06-01 VITALS — Temp 97.0°F

## 2019-06-01 DIAGNOSIS — M1000 Idiopathic gout, unspecified site: Secondary | ICD-10-CM | POA: Diagnosis not present

## 2019-06-01 DIAGNOSIS — M79671 Pain in right foot: Secondary | ICD-10-CM

## 2019-06-01 DIAGNOSIS — M10071 Idiopathic gout, right ankle and foot: Secondary | ICD-10-CM

## 2019-06-01 DIAGNOSIS — M779 Enthesopathy, unspecified: Secondary | ICD-10-CM | POA: Diagnosis not present

## 2019-06-01 NOTE — Patient Instructions (Signed)
 Gout  Gout is painful swelling of your joints. Gout is a type of arthritis. It is caused by having too much uric acid in your body. Uric acid is a chemical that is made when your body breaks down substances called purines. If your body has too much uric acid, sharp crystals can form and build up in your joints. This causes pain and swelling. Gout attacks can happen quickly and be very painful (acute gout). Over time, the attacks can affect more joints and happen more often (chronic gout). What are the causes?  Too much uric acid in your blood. This can happen because: ? Your kidneys do not remove enough uric acid from your blood. ? Your body makes too much uric acid. ? You eat too many foods that are high in purines. These foods include organ meats, some seafood, and beer.  Trauma or stress. What increases the risk?  Having a family history of gout.  Being male and middle-aged.  Being male and having gone through menopause.  Being very overweight (obese).  Drinking alcohol, especially beer.  Not having enough water in the body (being dehydrated).  Losing weight too quickly.  Having an organ transplant.  Having lead poisoning.  Taking certain medicines.  Having kidney disease.  Having a skin condition called psoriasis. What are the signs or symptoms? An attack of acute gout usually happens in just one joint. The most common place is the big toe. Attacks often start at night. Other joints that may be affected include joints of the feet, ankle, knee, fingers, wrist, or elbow. Symptoms of an attack may include:  Very bad pain.  Warmth.  Swelling.  Stiffness.  Shiny, red, or purple skin.  Tenderness. The affected joint may be very painful to touch.  Chills and fever. Chronic gout may cause symptoms more often. More joints may be involved. You may also have white or yellow lumps (tophi) on your hands or feet or in other areas near your joints. How is this  treated?  Treatment for this condition has two phases: treating an acute attack and preventing future attacks.  Acute gout treatment may include: ? NSAIDs. ? Steroids. These are taken by mouth or injected into a joint. ? Colchicine. This medicine relieves pain and swelling. It can be given by mouth or through an IV tube.  Preventive treatment may include: ? Taking small doses of NSAIDs or colchicine daily. ? Using a medicine that reduces uric acid levels in your blood. ? Making changes to your diet. You may need to see a food expert (dietitian) about what to eat and drink to prevent gout. Follow these instructions at home: During a gout attack   If told, put ice on the painful area: ? Put ice in a plastic bag. ? Place a towel between your skin and the bag. ? Leave the ice on for 20 minutes, 2-3 times a day.  Raise (elevate) the painful joint above the level of your heart as often as you can.  Rest the joint as much as possible. If the joint is in your leg, you may be given crutches.  Follow instructions from your doctor about what you cannot eat or drink. Avoiding future gout attacks  Eat a low-purine diet. Avoid foods and drinks such as: ? Liver. ? Kidney. ? Anchovies. ? Asparagus. ? Herring. ? Mushrooms. ? Mussels. ? Beer.  Stay at a healthy weight. If you want to lose weight, talk with your doctor. Do not lose   weight too fast.  Start or continue an exercise plan as told by your doctor. Eating and drinking  Drink enough fluids to keep your pee (urine) pale yellow.  If you drink alcohol: ? Limit how much you use to:  0-1 drink a day for women.  0-2 drinks a day for men. ? Be aware of how much alcohol is in your drink. In the [place] one drink equals one 12 oz bottle of beer (355 mL), one 5 oz glass of wine (148 mL), or one 1 oz glass of hard liquor (44 mL). General instructions  Take over-the-counter and prescription medicines only as told by your doctor.  Do  not drive or use heavy machinery while taking prescription pain medicine.  Return to your normal activities as told by your doctor. Ask your doctor what activities are safe for you.  Keep all follow-up visits as told by your doctor. This is important. Contact a doctor if:  You have another gout attack.  You still have symptoms of a gout attack after 10 days of treatment.  You have problems (side effects) because of your medicines.  You have chills or a fever.  You have burning pain when you pee (urinate).  You have pain in your lower back or belly. Get help right away if:  You have very bad pain.  Your pain cannot be controlled.  You cannot pee. Summary  Gout is painful swelling of the joints.  The most common site of pain is the big toe, but it can affect other joints.  Medicines and avoiding some foods can help to prevent and treat gout attacks. This information is not intended to replace advice given to you by your health care provider. Make sure you discuss any questions you have with your health care provider. Document Revised: 10/02/2017 Document Reviewed: 10/02/2017 Elsevier Patient Education  2020 Elsevier Inc.  

## 2019-06-04 NOTE — Progress Notes (Signed)
Subjective:   Patient ID: [name], male   DOB: 69 y.o.   MRN: [id number]   HPI Patient presents with concerns about pain on top of the right foot of moderate nature and also possibility for gout in his unsure about gout and the possibility it could be causing him to have problems   ROS      Objective:  Physical Exam  Neurovascular status unchanged with patient found to have diffuse pain across the extensor complex right mild to moderate in intensity with no indications of deep seeded pain with mild inflammation around the first MPJ localized F2     Assessment:  Possibility for gout versus possibility for extensor type tendinitis     Plan:  H&P all conditions reviewed and I recommended the utilization of anti-inflammatories physical therapy and support.  Patient is given all instructions on gout and gout diet to follow and will get a hold off and see how this does for him.  I educated him on gout we discussed different medications which may be necessary in future and he will continue on current medicines

## 2019-06-19 ENCOUNTER — Other Ambulatory Visit: Payer: Self-pay | Admitting: *Deleted

## 2019-06-19 MED ORDER — ALLOPURINOL 300 MG PO TABS: 300.0000 mg | ORAL_TABLET | Freq: Every day | ORAL | 3 refills | Status: AC

## 2019-07-11 ENCOUNTER — Other Ambulatory Visit: Payer: Self-pay | Admitting: Internal Medicine

## 2019-08-01 ENCOUNTER — Other Ambulatory Visit: Payer: Self-pay | Admitting: Internal Medicine

## 2019-08-01 DIAGNOSIS — I10 Essential (primary) hypertension: Secondary | ICD-10-CM

## 2019-09-14 ENCOUNTER — Telehealth: Payer: Self-pay | Admitting: Internal Medicine

## 2019-09-14 MED ORDER — DOXAZOSIN MESYLATE 4 MG PO TABS: 4.0000 mg | ORAL_TABLET | Freq: Two times a day (BID) | ORAL | 11 refills | Status: DC

## 2019-09-14 NOTE — Telephone Encounter (Signed)
The most likely culprit is amlodipine so rec   D/c amlodipine and flomax  Start cardura 4 mg bid which helps both the prostate and the bp and does not cause swelling   Continue  the lasix and take extra dose until sweling is resolved but should go away quickly off amlopidpine  If still having pain p swelling down see podiatry.  Let me know if bp meds need to be tweaked with the above changes

## 2019-09-14 NOTE — Telephone Encounter (Signed)
Called and spoke with pt who stated he began to notice swelling in feet after pt's BP med was changed and then had him switch to lasix.  He said maybe over the last month this has been more noticeable. He stated he has had a couple episodes of gout since last visit that have happened about a couple months apart from each other.  Pt said the swelling is in feet and ankles. Pt said when he goes to lift his right foot, he has some pain to the top right side of his foot and occ he will get some pain that is in the big toe on both feet.  Pt is taking all meds as prescribed.   Pt wants to know what can be recommended to help with symptoms. Dr. Sherene Sires, please advise.  Assessment & Plan:      Assessment & Plan Note by Nyoka Cowden, MD at 05/12/2019 2:28 PM Author: Nyoka Cowden, MD Author Type: Physician Filed: 05/12/2019 2:30 PM  Note Status: Carlisle Cater: Cosign Not Required Encounter Date: 05/12/2019  Problem: Essential hypertension  Editor: Nyoka Cowden, MD (Physician)      Prior Versions: 1. Nyoka Cowden, MD (Physician) at 05/12/2019 2:28 PM - Written              Lab Results  Component Value Date   CREATININE 1.26 05/12/2019   CREATININE 1.25 02/09/2019   CREATININE 1.32 11/14/2018     May actually be over treated now vs hctz so ok to hold lasix or just take Lasix 20 mg one half daily as long as monitor bp with change     Assessment & Plan Note by Nyoka Cowden, MD at 05/12/2019 2:29 PM Author: Nyoka Cowden, MD Author Type: Physician Filed: 05/12/2019 2:29 PM  Note Status: Written Cosign: Cosign Not Required Encounter Date: 05/12/2019  Problem: Drug-induced gout  Editor: Nyoka Cowden, MD (Physician)                 Gout L great toe developed 7/ 2020 with UA = 7.7 on 01/12/19 while on hctz/ d/c'd 01/22/2019 > level 6.2 on 02/09/2019 on allopurinol 100/diet so increased allopurinol to 300 mg daily > level 5.4 05/12/2019 so no change rx   Adequate control  on present rx, reviewed in detail with pt > no change in rx needed            Each maintenance medication was reviewed in detail including emphasizing most importantly the difference between maintenance and prns and under what circumstances the prns are to be triggered using an action plan format where appropriate.  Total time for H and P, chart review, counseling  and generating customized AVS unique to this office visit / charting = 20 min          Patient Instructions by Nyoka Cowden, MD at 05/12/2019 9:30 AM Author: Nyoka Cowden, MD Author Type: Physician Filed: 05/12/2019 9:49 AM  Note Status: Addendum Cosign: Cosign Not Required Encounter Date: 05/12/2019  Editor: Nyoka Cowden, MD (Physician)      Prior Versions: 1. Nyoka Cowden, MD (Physician) at 05/12/2019 9:48 AM - Addendum   2. Nyoka Cowden, MD (Physician) at 05/12/2019 9:44 AM - Signed      Please remember to go to the lab department   for your tests - we will call you with the results when they are available.   Ok to hold lasix or take one half  daily    Please schedule a follow up visit in 6  months but call sooner if needed

## 2019-09-14 NOTE — Telephone Encounter (Signed)
Called and spoke with pt letting him know the info stated by Dr. Sherene Sires and he verbalized understanding. Verified preferred pharmacy and sent med to pharmacy for pt. Also discontinued the two meds per Dr. Sherene Sires. Nothing further needed.

## 2019-10-12 DIAGNOSIS — I10 Essential (primary) hypertension: Secondary | ICD-10-CM

## 2019-10-13 ENCOUNTER — Telehealth: Payer: Self-pay | Admitting: Internal Medicine

## 2019-10-13 NOTE — Telephone Encounter (Signed)
Please see new mychart message from pt.

## 2019-10-13 NOTE — Telephone Encounter (Signed)
Dr. Sherene Sires, please see mychart message from pt.

## 2019-10-13 NOTE — Telephone Encounter (Signed)
Discussed with pt already   Avoid aleve, use tylenol instead  Take extra lasix and extra tenex   Please refer ASAP  to nephrology : re hbp

## 2019-10-13 NOTE — Telephone Encounter (Signed)
Order has been placed for the referral to nephrology. Nothing further needed.

## 2019-10-13 NOTE — Telephone Encounter (Signed)
Dr. [name], please see pt's mychart message and advise. 

## 2019-10-13 NOTE — Telephone Encounter (Signed)
mychart message was also sent by pt about this. Will close this encounter as the mychart message has already been sent to [facility].

## 2019-10-14 ENCOUNTER — Other Ambulatory Visit: Payer: Self-pay

## 2019-10-14 ENCOUNTER — Emergency Department (HOSPITAL_COMMUNITY): Payer: BC Managed Care – PPO

## 2019-10-14 DIAGNOSIS — Z7982 Long term (current) use of aspirin: Secondary | ICD-10-CM | POA: Diagnosis not present

## 2019-10-14 DIAGNOSIS — R519 Headache, unspecified: Secondary | ICD-10-CM | POA: Diagnosis not present

## 2019-10-14 DIAGNOSIS — N181 Chronic kidney disease, stage 1: Secondary | ICD-10-CM | POA: Insufficient documentation

## 2019-10-14 DIAGNOSIS — M2550 Pain in unspecified joint: Secondary | ICD-10-CM | POA: Insufficient documentation

## 2019-10-14 DIAGNOSIS — Z79899 Other long term (current) drug therapy: Secondary | ICD-10-CM | POA: Insufficient documentation

## 2019-10-14 DIAGNOSIS — I129 Hypertensive chronic kidney disease with stage 1 through stage 4 chronic kidney disease, or unspecified chronic kidney disease: Secondary | ICD-10-CM | POA: Insufficient documentation

## 2019-10-14 MED ORDER — MINOXIDIL 2.5 MG PO TABS: 2.5000 mg | ORAL_TABLET | Freq: Two times a day (BID) | ORAL | 2 refills | Status: DC

## 2019-10-14 NOTE — Telephone Encounter (Signed)
Spoke with the pt and notified of response per Dr Sherene Sires  Rx for minoxodil was sent  I will sent to [facility] so see about sooner renal referral somewhere in [place] thanks

## 2019-10-14 NOTE — ED Triage Notes (Addendum)
Patient reports elevated BP x2 days. Reports taking BP meds as prescribed. Reports slight headache x24 hours. Denies blurred vision, SOB, and chest pain.  Patient also c/o left hip pain x10 days after long travel in the car. Reports pain worsens with sitting still. Ambulatory.

## 2019-10-14 NOTE — Telephone Encounter (Signed)
LMTCB x 1 

## 2019-10-14 NOTE — Telephone Encounter (Signed)
I talked to him at 240 pm 10/14/2019   See when he can see renal in [place] ASAP   Let him know I've thought about it and the safest add on for now is minoxidil  2.5 mg twice daily (not prn) until seen by renal and in meantime can use extra tenex prn as having pain from him and that may be driving the bp up   Advised him to go to ER if can't get dbp < 120 consistently

## 2019-10-14 NOTE — Telephone Encounter (Signed)
Patient returning call. Please advise [phone]

## 2019-10-14 NOTE — Telephone Encounter (Signed)
Please advise on pt email thanks  [name], BP this morning is 172/117, pulse 74; slight headache that started yesterday. Lot of discomfort from my hip/leg thing. Tylenol helps that for several hours at a time. Should I take another Tenex now?  Thanks again. [name]

## 2019-10-14 NOTE — Telephone Encounter (Signed)
Pt calling about nephrology consult. Provided pt with [facility] phone number. Pt also would like to check in with MW about his blood pressure. Pt can be reached at [phone]

## 2019-10-14 NOTE — Telephone Encounter (Signed)
PCC's pt lives in [place] and he was referred to nephrology in [place] and they can not see him for weeks. Can you get him in somewhere in [place] sooner? Please advise thanks

## 2019-10-14 NOTE — Telephone Encounter (Signed)
Please advise on pt email thanks   I sent a message this morning with update. 172/117, pulse 74; slight headache in right temple. Hip/leg pain still uncomfortable; Tylenol (2 ES) helps for 4 hours or so. Should I take a Tenex this morning?

## 2019-10-15 ENCOUNTER — Encounter (HOSPITAL_COMMUNITY): Payer: Self-pay

## 2019-10-15 ENCOUNTER — Emergency Department (HOSPITAL_COMMUNITY)
Admission: EM | Admit: 2019-10-15 | Discharge: 2019-10-15 | Disposition: A | Discharge status: Home / Self Care | Payer: BC Managed Care – PPO | Attending: Emergency Medicine | Admitting: Emergency Medicine

## 2019-10-15 ENCOUNTER — Telehealth: Payer: Self-pay | Admitting: Internal Medicine

## 2019-10-15 ENCOUNTER — Emergency Department (HOSPITAL_COMMUNITY): Payer: BC Managed Care – PPO

## 2019-10-15 DIAGNOSIS — M1990 Unspecified osteoarthritis, unspecified site: Secondary | ICD-10-CM

## 2019-10-15 DIAGNOSIS — I10 Essential (primary) hypertension: Secondary | ICD-10-CM

## 2019-10-15 LAB — CBC WITH DIFFERENTIAL/PLATELET
Abs Immature Granulocytes: 0.02 10*3/uL (ref 0.00–0.07)
Basophils Absolute: 0.1 10*3/uL (ref 0.0–0.1)
Basophils Relative: 1 %
Eosinophils Absolute: 0.3 10*3/uL (ref 0.0–0.5)
Eosinophils Relative: 4 %
HCT: 46.9 % (ref 39.0–52.0)
Hemoglobin: 15.2 g/dL (ref 13.0–17.0)
Immature Granulocytes: 0 %
Lymphocytes Relative: 16 %
Lymphs Abs: 1.3 10*3/uL (ref 0.7–4.0)
MCH: 29.6 pg (ref 26.0–34.0)
MCHC: 32.4 g/dL (ref 30.0–36.0)
MCV: 91.2 fL (ref 80.0–100.0)
Monocytes Absolute: 1 10*3/uL (ref 0.1–1.0)
Monocytes Relative: 12 %
Neutro Abs: 5.4 10*3/uL (ref 1.7–7.7)
Neutrophils Relative %: 67 %
Platelets: 259 10*3/uL (ref 150–400)
RBC: 5.14 MIL/uL (ref 4.22–5.81)
RDW: 14.7 % (ref 11.5–15.5)
WBC: 8.1 10*3/uL (ref 4.0–10.5)
nRBC: 0 % (ref 0.0–0.2)

## 2019-10-15 LAB — I-STAT CHEM 8, ED
BUN: 22 mg/dL (ref 8–23)
Calcium, Ion: 1.12 mmol/L — ABNORMAL LOW (ref 1.15–1.40)
Chloride: 101 mmol/L (ref 98–111)
Creatinine, Ser: 1.3 mg/dL — ABNORMAL HIGH (ref 0.61–1.24)
Glucose, Bld: 98 mg/dL (ref 70–99)
HCT: 44 % (ref 39.0–52.0)
Hemoglobin: 15 g/dL (ref 13.0–17.0)
Potassium: 3.2 mmol/L — ABNORMAL LOW (ref 3.5–5.1)
Sodium: 143 mmol/L (ref 135–145)
TCO2: 26 mmol/L (ref 22–32)

## 2019-10-15 MED ORDER — AMLODIPINE BESYLATE 5 MG PO TABS
2.5000 mg | ORAL_TABLET | Freq: Once | ORAL | Status: AC
  Administered 2019-10-15: 2.5 mg via ORAL
  Filled 2019-10-15: qty 1

## 2019-10-15 MED ORDER — LIDOCAINE 5 % EX PTCH: 1.0000 | MEDICATED_PATCH | CUTANEOUS | 0 refills | Status: DC

## 2019-10-15 MED ORDER — IBUPROFEN 800 MG PO TABS
800.0000 mg | ORAL_TABLET | Freq: Once | ORAL | Status: AC
  Administered 2019-10-15: 800 mg via ORAL
  Filled 2019-10-15: qty 1

## 2019-10-15 MED ORDER — HYDROCHLOROTHIAZIDE 12.5 MG PO CAPS
12.5000 mg | ORAL_CAPSULE | Freq: Once | ORAL | Status: AC
  Administered 2019-10-15: 12.5 mg via ORAL
  Filled 2019-10-15: qty 1

## 2019-10-15 MED ORDER — LIDOCAINE 5 % EX PTCH
1.0000 | MEDICATED_PATCH | CUTANEOUS | Status: DC
  Administered 2019-10-15: 1 via TRANSDERMAL
  Filled 2019-10-15: qty 1

## 2019-10-15 MED ORDER — ACETAMINOPHEN 500 MG PO TABS
1000.0000 mg | ORAL_TABLET | Freq: Once | ORAL | Status: DC
  Filled 2019-10-15: qty 2

## 2019-10-15 NOTE — ED Notes (Signed)
Pt ambulated to room without difficulty

## 2019-10-15 NOTE — Telephone Encounter (Signed)
Dr. Sherene Sires, do you still want [place] to move forward with the recommendation you made yesterday regarding Tenex? Patient was in the hospital last night. Please advise.

## 2019-10-15 NOTE — Telephone Encounter (Signed)
mychart message also sent by pt- sent to dr. Sherene Sires. Will close this open encounter.

## 2019-10-15 NOTE — ED Provider Notes (Signed)
[facility]-EMERGENCY DEPT Provider Note   CSN: [id number] Arrival date & time: 10/14/19  1941     History Chief Complaint  Patient presents with  . Hypertension    [name] is a 69 y.o. male.  The history is provided by the patient.  Hypertension This is a chronic problem. The current episode started more than 1 week ago. The problem occurs constantly. The problem has been gradually worsening. Associated symptoms include headaches. Pertinent negatives include no chest pain, no abdominal pain and no shortness of breath. Nothing aggravates the symptoms. Nothing relieves the symptoms. Treatments tried: tenex  The treatment provided no relief.  Patient with HTN who presents with concerns over his HTN. He has increased his medication and it is staying in the 170s.  Dr. Sherene Sires is referring him to [facility] but he has not seen them yet.  No CP, no SOB, no n/v/d.  No changes in vision or speech or facial asymmetry.  No weakness no numbness. Has ongoing hip pain as well.       Past Medical History:  Diagnosis Date  . Adult attention deficit disorder   . Benign prostatic hypertrophy   . Bradycardia   . Diverticulitis of colon    colonoscopy 07/11/2000  . HBP (high blood pressure)    change flomax to cardura for bp in December 2011  . Nephrolithiasis    prostatitis with noncaseating granulomatous inflammation July 2000 negative IPPD.    [name] Syncope     Patient Active Problem List   Diagnosis Date Noted  . Drug-induced gout 01/23/2019  . Bilateral high frequency sensorineural hearing loss 02/25/2018  . Tremor 11/08/2016  . Epistaxis 05/14/2016  . Chronic renal disease, stage I 04/19/2016  . Syncope 04/27/2015  . Acute URI 01/11/2015  . Hyperlipidemia LDL goal <130 10/18/2014  . Health care maintenance 04/10/2013  . PREMATURE VENTRICULAR CONTRACTIONS, FREQUENT 05/24/2010  . PAROXYSMAL ATRIAL FIBRILLATION 03/17/2010  . SYNCOPE 07/21/2008  . ATTENTION  DEFICIT DISORDER, ADULT 07/24/2007  . Essential hypertension 07/24/2007  . Diverticulosis 07/24/2007  . BPH (benign prostatic hyperplasia) 07/24/2007    Past Surgical History:  Procedure Laterality Date  . CYSTOSTOMY W/ BLADDER BIOPSY     from trigone  . TONSILLECTOMY  1958  . US ECHOCARDIOGRAPHY  07/06/08       Family History  Problem Relation Age of Onset  . Heart disease Mother 21       IHD with CABG  . Kidney failure Father   . Hypertension Father   . Ehlers-Danlos syndrome Child   . Colon cancer Neg Hx     Social History   Tobacco Use  . Smoking status: Never Smoker  . Smokeless tobacco: Never Used  Vaping Use  . Vaping Use: Never used  Substance Use Topics  . Alcohol use: Yes    Alcohol/week: 3.0 standard drinks    Types: 3 Glasses of wine per week  . Drug use: No    Home Medications Prior to Admission medications   Medication Sig Start Date End Date Taking? Authorizing Provider  guanFACINE (TENEX) 1 MG tablet TAKE ONE AND ONE-HALF TABLETS AT BEDTIME 07/13/19   Nyoka Cowden, MD  allopurinol (ZYLOPRIM) 300 MG tablet Take 1 tablet (300 mg total) by mouth daily. 06/19/19   Nyoka Cowden, MD  aspirin EC 81 MG tablet Take 1 tablet (81 mg total) by mouth daily. 05/15/16   Nyoka Cowden, MD  doxazosin (CARDURA) 4 MG tablet Take  1 tablet (4 mg total) by mouth 2 (two) times daily. 09/14/19   Nyoka Cowden, MD  finasteride (PROSCAR) 5 MG tablet Take 5 mg by mouth daily.      [provider]  furosemide (LASIX) 20 MG tablet Take 1 tablet (20 mg total) by mouth daily. 04/06/19   Nyoka Cowden, MD  metoprolol succinate (TOPROL-XL) 50 MG 24 hr tablet TAKE 1 TABLET DAILY. TAKE WITH OR IMMEDIATELY FOLLOWING A MEAL 08/03/19   Nyoka Cowden, MD  minoxidil (LONITEN) 2.5 MG tablet Take 1 tablet (2.5 mg total) by mouth 2 (two) times daily. 10/14/19   Nyoka Cowden, MD  Multiple Vitamins-Minerals (VITAMIN D3 COMPLETE PO) Take 2,000 Units by mouth daily.     [provider]  sildenafil (REVATIO) 20 MG tablet TAKE 1 TABLET PO DAILY PRN 1 TO 5 PO PRN 11/24/18   [provider]    Allergies    Amoxicillin  Review of Systems   Review of Systems  Constitutional: Negative for fever.  HENT: Negative for congestion.   Eyes: Negative for visual disturbance.  Respiratory: Negative for shortness of breath.   Cardiovascular: Negative for chest pain and leg swelling.  Gastrointestinal: Negative for abdominal pain.  Genitourinary: Negative for difficulty urinating.  Musculoskeletal: Positive for arthralgias. Negative for back pain, gait problem and joint swelling.  Neurological: Positive for headaches. Negative for dizziness, facial asymmetry, speech difficulty, weakness and numbness.  Psychiatric/Behavioral: Negative for agitation.  All other systems reviewed and are negative.   Physical Exam Updated Vital Signs BP (!) 174/121 (BP Location: Left Arm)   Pulse 69   Temp 98.8 F (37.1 C)   Resp 18   SpO2 99%   Physical Exam Vitals and nursing note reviewed.  Constitutional:      General: He is not in acute distress.    Appearance: Normal appearance.  HENT:     Head: Normocephalic and atraumatic.     Nose: Nose normal.  Eyes:     Conjunctiva/sclera: Conjunctivae normal.     Pupils: Pupils are equal, round, and reactive to light.  Cardiovascular:     Rate and Rhythm: Normal rate and regular rhythm.     Pulses: Normal pulses.     Heart sounds: Normal heart sounds.  Pulmonary:     Effort: Pulmonary effort is normal.     Breath sounds: Normal breath sounds.  Abdominal:     General: Abdomen is flat. Bowel sounds are normal.     Tenderness: There is no abdominal tenderness. There is no guarding or rebound.  Musculoskeletal:        General: No swelling or tenderness. Normal range of motion.     Cervical back: Normal range of motion and neck supple.     Comments: FROM of the left hip.  No swelling no tenderness of the LLE,  negative homan's sign   Skin:    General: Skin is warm and dry.     Capillary Refill: Capillary refill takes less than 2 seconds.  Neurological:     General: No focal deficit present.     Mental Status: He is alert and oriented to person, place, and time.     Deep Tendon Reflexes: Reflexes normal.  Psychiatric:        Mood and Affect: Mood normal.        Behavior: Behavior normal.     ED Results / Procedures / Treatments   Labs (all labs ordered are listed, but only abnormal  results are displayed) Results for orders placed or performed during the hospital encounter of 10/15/19  CBC with Differential/Platelet  Result Value Ref Range   WBC 8.1 4.0 - 10.5 K/uL   RBC 5.14 4.22 - 5.81 MIL/uL   Hemoglobin 15.2 13.0 - 17.0 g/dL   HCT 45.4 39 - 52 %   MCV 91.2 80.0 - 100.0 fL   MCH 29.6 26.0 - 34.0 pg   MCHC 32.4 30.0 - 36.0 g/dL   RDW 09.8 11.9 - 14.7 %   Platelets 259 150 - 400 K/uL   nRBC 0.0 0.0 - 0.2 %   Neutrophils Relative % 67 %   Neutro Abs 5.4 1.7 - 7.7 K/uL   Lymphocytes Relative 16 %   Lymphs Abs 1.3 0.7 - 4.0 K/uL   Monocytes Relative 12 %   Monocytes Absolute 1.0 0 - 1 K/uL   Eosinophils Relative 4 %   Eosinophils Absolute 0.3 0 - 0 K/uL   Basophils Relative 1 %   Basophils Absolute 0.1 0 - 0 K/uL   Immature Granulocytes 0 %   Abs Immature Granulocytes 0.02 0.00 - 0.07 K/uL  I-stat chem 8, ED (not at [facility] or [facility])  Result Value Ref Range   Sodium 143 135 - 145 mmol/L   Potassium 3.2 (L) 3.5 - 5.1 mmol/L   Chloride 101 98 - 111 mmol/L   BUN 22 8 - 23 mg/dL   Creatinine, Ser 8.29 (H) 0.61 - 1.24 mg/dL   Glucose, Bld 98 70 - 99 mg/dL   Calcium, Ion 5.62 (L) 1.15 - 1.40 mmol/L   TCO2 26 22 - 32 mmol/L   Hemoglobin 15.0 13.0 - 17.0 g/dL   HCT 13.0 39 - 52 %   CT Head Wo Contrast  Result Date: 10/15/2019 CLINICAL DATA:  Hypertension for 2 days, headache for 1 day EXAM: CT HEAD WITHOUT CONTRAST TECHNIQUE: Contiguous axial images were obtained from the base of  the skull through the vertex without intravenous contrast. COMPARISON:  03/02/2010 FINDINGS: Brain: No acute infarct or hemorrhage. Lateral ventricles and midline structures are unremarkable. No acute extra-axial fluid collections. No mass effect. Vascular: No hyperdense vessel or unexpected calcification. Skull: Normal. Negative for fracture or focal lesion. Sinuses/Orbits: Mild mucosal thickening within the ethmoid air cells. The remaining paranasal sinuses are clear. Other: None. IMPRESSION: 1. Minimal ethmoid sinus disease. 2. No acute intracranial process. Electronically Signed   By: Sharlet Salina M.D.   On: 10/15/2019 01:27   DG Hip Unilat With Pelvis 2-3 Views Left  Result Date: 10/14/2019 CLINICAL DATA:  Elevated blood pressure for 2 days. LEFT hip pain for 10 days after long trip in the car. EXAM: DG HIP (WITH OR WITHOUT PELVIS) 2-3V LEFT COMPARISON:  None. FINDINGS: There is mild degenerative change in the LEFT hip. No acute fracture or subluxation. IMPRESSION: No evidence for acute abnormality. Electronically Signed   By: Norva Pavlov M.D.   On: 10/14/2019 20:54    EKG EKG Interpretation  Date/Time:  Thursday October 15 2019 02:29:12 EDT Ventricular Rate:  61 PR Interval:    QRS Duration: 116 QT Interval:  449 QTC Calculation: 453 R Axis:   33 Text Interpretation: Sinus rhythm Left ventricular hypertrophy Confirmed by Manilla Strieter ([id number]) on 10/15/2019 2:33:47 AM   Radiology CT Head Wo Contrast  Result Date: 10/15/2019 CLINICAL DATA:  Hypertension for 2 days, headache for 1 day EXAM: CT HEAD WITHOUT CONTRAST TECHNIQUE: Contiguous axial images were obtained from the base of the skull  through the vertex without intravenous contrast. COMPARISON:  03/02/2010 FINDINGS: Brain: No acute infarct or hemorrhage. Lateral ventricles and midline structures are unremarkable. No acute extra-axial fluid collections. No mass effect. Vascular: No hyperdense vessel or unexpected calcification.  Skull: Normal. Negative for fracture or focal lesion. Sinuses/Orbits: Mild mucosal thickening within the ethmoid air cells. The remaining paranasal sinuses are clear. Other: None. IMPRESSION: 1. Minimal ethmoid sinus disease. 2. No acute intracranial process. Electronically Signed   By: Sharlet Salina M.D.   On: 10/15/2019 01:27   DG Hip Unilat With Pelvis 2-3 Views Left  Result Date: 10/14/2019 CLINICAL DATA:  Elevated blood pressure for 2 days. LEFT hip pain for 10 days after long trip in the car. EXAM: DG HIP (WITH OR WITHOUT PELVIS) 2-3V LEFT COMPARISON:  None. FINDINGS: There is mild degenerative change in the LEFT hip. No acute fracture or subluxation. IMPRESSION: No evidence for acute abnormality. Electronically Signed   By: Norva Pavlov M.D.   On: 10/14/2019 20:54    Procedures Procedures (including critical care time)  Medications Ordered in ED Medications  lidocaine (LIDODERM) 5 % 1 patch (1 patch Transdermal Patch Applied 10/15/19 0214)  ibuprofen (ADVIL) tablet 800 mg (800 mg Oral Given 10/15/19 0230)  hydrochlorothiazide (MICROZIDE) capsule 12.5 mg (12.5 mg Oral Given 10/15/19 0230)    ED Course  I have reviewed the triage vital signs and the nursing notes.  Pertinent labs & imaging results that were available during my care of the patient were reviewed by me and considered in my medical decision making (see chart for details).    Has mild arthritis of the hip.  No calf pain or tenderness.  No weakness.  As far as the BP is concerned there is no indication for emergent lowering based JNC 7 guidelines.  I agree with follow up with [facility].  I have informed the patient's wife who thinks this happens in the ED that he will not be seen for this in the ED and will need to follow up as an outpatient.  I have treated the hip pain in the ED with ibuprofen and lidoderm.  Headache which was mild and within 6 hours (head CT is normal so no indication for LP) has resolved.     Kien HAGER COMPSTON was evaluated in Emergency Department on 10/15/2019 for the symptoms described in the history of present illness. He was evaluated in the context of the global COVID-19 pandemic, which necessitated consideration that the patient might be at risk for infection with the SARS-CoV-2 virus that causes COVID-19. Institutional protocols and algorithms that pertain to the evaluation of patients at risk for COVID-19 are in a state of rapid change based on information released by regulatory bodies including the CDC and federal and state organizations. These policies and algorithms were followed during the patient's care in the ED.  Final Clinical Impression(s) / ED Diagnoses  Return for intractable cough, coughing up blood,fevers >100.4 unrelieved by medication, shortness of breath, intractable vomiting, chest pain, shortness of breath, weakness,numbness, changes in speech, facial asymmetry,abdominal pain, passing out,Inability to tolerate liquids or food, cough, altered mental status or any concerns. No signs of systemic illness or infection. The patient is nontoxic-appearing on exam and vital signs are within normal limits.   I have reviewed the triage vital signs and the nursing notes. Pertinent labs &imaging results that were available during my care of the patient were reviewed by me and considered in my medical decision making (see chart for details).After  history, exam, and medical workup I feel the patient has beenappropriately medically screened and is safe for discharge home. Pertinent diagnoses were discussed with the patient. Patient was given return precautions.   Lekendrick Alpern, MD 10/15/19 [phone]

## 2019-10-15 NOTE — Telephone Encounter (Signed)
Yes, start minoxidil 2.5 mg bid Reduce cardura to 4 mg q pm only

## 2019-10-22 ENCOUNTER — Encounter: Payer: Self-pay | Admitting: Pulmonary Disease

## 2019-10-22 ENCOUNTER — Telehealth: Payer: Self-pay | Admitting: Pulmonary Disease

## 2019-10-22 ENCOUNTER — Ambulatory Visit (INDEPENDENT_AMBULATORY_CARE_PROVIDER_SITE_OTHER): Payer: BC Managed Care – PPO | Admitting: Pulmonary Disease

## 2019-10-22 ENCOUNTER — Other Ambulatory Visit: Payer: Self-pay

## 2019-10-22 VITALS — BP 160/100 | HR 56 | Resp 16 | Ht 76.0 in | Wt 211.0 lb

## 2019-10-22 DIAGNOSIS — I10 Essential (primary) hypertension: Secondary | ICD-10-CM

## 2019-10-22 MED ORDER — LOSARTAN POTASSIUM 50 MG PO TABS: 50.0000 mg | ORAL_TABLET | Freq: Every day | ORAL | 0 refills | Status: DC

## 2019-10-22 NOTE — Patient Instructions (Addendum)
You were seen today by Coral Ceo, NP  for:   1. Essential hypertension  - losartan (COZAAR) 50 MG tablet; Take 1 tablet (50 mg total) by mouth daily.  Dispense: 30 tablet; Refill: 0  Continue Cardura 4 mg 1 tablet daily  Continue furosemide 20 mg 1 tablet daily  Continue minoxidil 2.5 mg take 1 tablet by mouth twice daily >>> Reviewed with Dr. Sherene Sires.  If you start to have elevated blood pressures on your trip can increase to taking 1 tablet every 6-8 hours  We will start you on losartan 50 mg, please present to our office 1 week after starting losartan to obtain baseline lab work  I have placed an order for you to be able to obtain lab work 1 week after starting losartan, please present to our office and they can draw this blood work  We recommend today:   Meds ordered this encounter  Medications  . losartan (COZAAR) 50 MG tablet    Sig: Take 1 tablet (50 mg total) by mouth daily.    Dispense:  30 tablet    Refill:  0    Follow Up:    Return in about 2 weeks (around 11/05/2019), or if symptoms worsen or fail to improve, for Follow up with Dr. Sherene Sires.   Please do your part to reduce the spread of COVID-19:      Reduce your risk of any infection  and COVID19 by using the similar precautions used for avoiding the common cold or flu:  [name] Wash your hands often with soap and warm water for at least 20 seconds.  If soap and water are not readily available, use an alcohol-based hand sanitizer with at least 60% alcohol.  . If coughing or sneezing, cover your mouth and nose by coughing or sneezing into the elbow areas of your shirt or coat, into a tissue or into your sleeve (not your hands). Drinda Butts A MASK when in public  . Avoid shaking hands with others and consider head nods or verbal greetings only. . Avoid touching your eyes, nose, or mouth with unwashed hands.  . Avoid close contact with people who are sick. . Avoid places or events with large numbers of people in one location,  like concerts or sporting events. . If you have some symptoms but not all symptoms, continue to monitor at home and seek medical attention if your symptoms worsen. . If you are having a medical emergency, call 911.   ADDITIONAL HEALTHCARE OPTIONS FOR PATIENTS   Telehealth / e-Visit: [email or web address]         [facility]: [phone]  [facility]: [phone]                   [facility]: [phone]     It is flu season:   >>> Best ways to protect herself from the flu: Receive the yearly flu vaccine, practice good hand hygiene washing with soap and also using hand sanitizer when available, eat a nutritious meals, get adequate rest, hydrate appropriately   Please contact the office if your symptoms worsen or you have concerns that you are not improving.   Thank you for choosing [facility] for your healthcare, and for allowing [place] to partner with you on your healthcare journey. I am thankful to be able to provide care to you today.   Elisha Headland FNP-C

## 2019-10-22 NOTE — Telephone Encounter (Signed)
10/22/2019  Saw patient in clinic today.  Dr. Sherene Sires wanted an update regarding the patient's nephrology referral.  Looks like this was originally placed back in June/2021.  Dr. Sherene Sires has concerns the patient may have renal artery stenosis.  He would like to have the patient evaluated by nephrology within the next week.  Other any updates regarding this referral?  Patient reported today that he has not been scheduled.  PCC's can you follow-up?  Arlys John

## 2019-10-22 NOTE — Progress Notes (Signed)
'@Patient'  ID: [name], male    DOB: April 14, 1950, 69 y.o.   MRN: [id number]  Chief Complaint  Patient presents with   Hospitalization Follow-up    BP follow up     Referring provider: Tanda Rockers, MD  HPI:  69 year old male never smoker followed in our office for hypertension, primary care  PMH: ADHD, hypertension, BPH, hyperlipidemia Smoker/ Smoking History: Never smoker Maintenance: None Pt of: Dr. Melvyn Novas  10/23/2019  - Visit   69 year old male never smoker followed in our office for primary care by Dr. Melvyn Novas.  He is presenting today as an emergency room follow-up from 10/15/2019.  Blood pressure was elevated at 174/121 in the emergency room.  He is a pending referral to [facility].  He was also treated for hip pain with ibuprofen and Lidoderm patch  10/15/2019-CT head-minimal ethmoid sinus disease, no acute intracranial process  Patient is currently maintained on:  Cardura 4 mg Lasix 20 mg Tenex 1 mg Metoprolol XL 50 mg  Patient was on amlodipine but this was stopped due to concerns over lower extremity swelling at patient's request.  Patient is also been dealing with ongoing hip pain since traveling to [place] in June/2021.  He seen orthopedic next week.  Patient is also planning on leaving from this appointment to go to [place] for this weekend.  Patient denies significant headache, loss of vision, dizziness. He does admit that he has some cloudy aspects to his vision but he has been followed by Dr. Katy Fitch for this. With known cataracts. He does not feel that this is acutely worsen.  He was referred to [facility] back in June/2021 by our office. Dr. Melvyn Novas had concerns for renal artery stenosis. He has not been scheduled for follow-up. He reports that he is friends with Dr. Trula Slade and he is trying to get set up with Dr. Raoul Pitch kidney. We will discuss and evaluate this today.  Questionaires / Pulmonary Flowsheets:   ACT:  No flowsheet  data found.  MMRC: mMRC Dyspnea Scale mMRC Score  11/14/2018 0    Epworth:  No flowsheet data found.  Tests:   FENO:  No results found for: NITRICOXIDE  PFT: No flowsheet data found.  WALK:  No flowsheet data found.  Imaging: CT Head Wo Contrast  Result Date: 10/15/2019 CLINICAL DATA:  Hypertension for 2 days, headache for 1 day EXAM: CT HEAD WITHOUT CONTRAST TECHNIQUE: Contiguous axial images were obtained from the base of the skull through the vertex without intravenous contrast. COMPARISON:  03/02/2010 FINDINGS: Brain: No acute infarct or hemorrhage. Lateral ventricles and midline structures are unremarkable. No acute extra-axial fluid collections. No mass effect. Vascular: No hyperdense vessel or unexpected calcification. Skull: Normal. Negative for fracture or focal lesion. Sinuses/Orbits: Mild mucosal thickening within the ethmoid air cells. The remaining paranasal sinuses are clear. Other: None. IMPRESSION: 1. Minimal ethmoid sinus disease. 2. No acute intracranial process. Electronically Signed   By: Randa Ngo M.D.   On: 10/15/2019 01:27   DG Hip Unilat With Pelvis 2-3 Views Left  Result Date: 10/14/2019 CLINICAL DATA:  Elevated blood pressure for 2 days. LEFT hip pain for 10 days after long trip in the car. EXAM: DG HIP (WITH OR WITHOUT PELVIS) 2-3V LEFT COMPARISON:  None. FINDINGS: There is mild degenerative change in the LEFT hip. No acute fracture or subluxation. IMPRESSION: No evidence for acute abnormality. Electronically Signed   By: Nolon Nations M.D.   On: 10/14/2019 20:54  Lab Results:  CBC    Component Value Date/Time   WBC 8.1 10/15/2019 0200   RBC 5.14 10/15/2019 0200   HGB 15.0 10/15/2019 0225   HCT 44.0 10/15/2019 0225   PLT 259 10/15/2019 0200   MCV 91.2 10/15/2019 0200   MCH 29.6 10/15/2019 0200   MCHC 32.4 10/15/2019 0200   RDW 14.7 10/15/2019 0200   LYMPHSABS 1.3 10/15/2019 0200   MONOABS 1.0 10/15/2019 0200   EOSABS 0.3 10/15/2019  0200   BASOSABS 0.1 10/15/2019 0200    BMET    Component Value Date/Time   NA 143 10/15/2019 0225   K 3.2 (L) 10/15/2019 0225   CL 101 10/15/2019 0225   CO2 31 05/12/2019 1024   GLUCOSE 98 10/15/2019 0225   BUN 22 10/15/2019 0225   CREATININE 1.30 (H) 10/15/2019 0225   CALCIUM 9.6 05/12/2019 1024   GFRNONAA 57 (L) 03/03/2010 0445   GFRAA  03/03/2010 0445    >60        The eGFR has been calculated using the MDRD equation. This calculation has not been validated in all clinical situations. eGFR's persistently <60 mL/min signify possible Chronic Kidney Disease.    BNP No results found for: BNP  ProBNP No results found for: PROBNP  Specialty Problems      Pulmonary Problems   Acute URI   Epistaxis    Onset around May 02 2016  > rec hold asa when actively bleeding, just take 81 mg and referred to ent          Allergies  Allergen Reactions   Amoxicillin Rash    Immunization History  Administered Date(s) Administered   Influenza Split 01/19/2014, 03/24/2015   Influenza Whole 01/25/2012, 03/15/2016   Influenza,inj,Quad PF,6+ Mos 01/19/2017, 01/03/2018   Influenza-Unspecified 01/03/2018, 12/25/2018   PFIZER SARS-COV-2 Vaccination 04/15/2019, 05/06/2019   Pneumococcal Conjugate-13 10/12/2014   Pneumococcal Polysaccharide-23 10/13/2015   Td 11/10/2008   Tdap 11/14/2018    Past Medical History:  Diagnosis Date   Adult attention deficit disorder    Benign prostatic hypertrophy    Bradycardia    Diverticulitis of colon    colonoscopy 07/11/2000   HBP (high blood pressure)    change flomax to cardura for bp in December 2011   Nephrolithiasis    prostatitis with noncaseating granulomatous inflammation July 2000 negative IPPD.     Syncope     Tobacco History: Social History   Tobacco Use  Smoking Status Never Smoker  Smokeless Tobacco Never Used   Counseling given: Not Answered   Continue to not smoke  Outpatient Encounter  Medications as of 10/22/2019  Medication Sig   acetaminophen (TYLENOL) 500 MG tablet Take 1,000 mg by mouth every 6 (six) hours as needed for mild pain.   allopurinol (ZYLOPRIM) 300 MG tablet Take 1 tablet (300 mg total) by mouth daily.   aspirin EC 81 MG tablet Take 1 tablet (81 mg total) by mouth daily.   cholecalciferol (VITAMIN D3) 25 MCG (1000 UNIT) tablet Take 1,000 Units by mouth daily.   doxazosin (CARDURA) 4 MG tablet Take 1 tablet (4 mg total) by mouth 2 (two) times daily. (Patient taking differently: Take 4 mg by mouth once. )   finasteride (PROSCAR) 5 MG tablet Take 5 mg by mouth daily.     furosemide (LASIX) 20 MG tablet Take 1 tablet (20 mg total) by mouth daily. (Patient taking differently: Take 20 mg by mouth daily. Can take extra 20 MG if needed for fluid)  guanFACINE (TENEX) 1 MG tablet TAKE ONE AND ONE-HALF TABLETS AT BEDTIME (Patient taking differently: Take 1.5 mg by mouth at bedtime. )   ibuprofen (ADVIL) 200 MG tablet Take 600-800 mg by mouth every 6 (six) hours as needed for moderate pain.   lidocaine (LIDODERM) 5 % Place 1 patch onto the skin daily. Remove & Discard patch within 12 hours or as directed by MD   losartan (COZAAR) 50 MG tablet Take 1 tablet (50 mg total) by mouth daily.   metoprolol succinate (TOPROL-XL) 50 MG 24 hr tablet TAKE 1 TABLET DAILY. TAKE WITH OR IMMEDIATELY FOLLOWING A MEAL (Patient taking differently: Take 50 mg by mouth daily. )   minoxidil (LONITEN) 2.5 MG tablet Take 1 tablet (2.5 mg total) by mouth 2 (two) times daily.   sildenafil (REVATIO) 20 MG tablet Take 20 mg by mouth daily as needed.    No facility-administered encounter medications on file as of 10/22/2019.     Review of Systems  Review of Systems  Constitutional: Negative for activity change, chills, fatigue, fever and unexpected weight change.  HENT: Negative for postnasal drip, rhinorrhea, sinus pressure, sinus pain and sore throat.   Eyes: Negative.     Respiratory: Negative for cough, shortness of breath and wheezing.   Cardiovascular: Negative for chest pain and palpitations.  Gastrointestinal: Negative for constipation, diarrhea, nausea and vomiting.  Endocrine: Negative.   Genitourinary: Negative.   Musculoskeletal: Negative.   Skin: Negative.   Neurological: Negative for dizziness and headaches.  Psychiatric/Behavioral: Negative.  Negative for dysphoric mood. The patient is not nervous/anxious.   All other systems reviewed and are negative.    Physical Exam  BP (!) 160/100 (BP Location: Left Arm, Patient Position: Sitting, Cuff Size: Normal)    Pulse 56    Resp 16    Ht '6\' 4"'  (1.93 m)    Wt (!) 211 lb (95.7 kg)    SpO2 98%    BMI 25.68 kg/m   Wt Readings from Last 5 Encounters:  10/22/19 (!) 211 lb (95.7 kg)  10/15/19 208 lb (94.3 kg)  05/12/19 207 lb (93.9 kg)  02/09/19 210 lb (95.3 kg)  11/14/18 203 lb 6.4 oz (92.3 kg)    BMI Readings from Last 5 Encounters:  10/22/19 25.68 kg/m  10/15/19 25.32 kg/m  05/12/19 25.20 kg/m  02/09/19 25.56 kg/m  11/14/18 24.76 kg/m     Physical Exam Vitals (Noted hypertension) and nursing note reviewed.  Constitutional:      General: He is not in acute distress.    Appearance: Normal appearance. He is obese.  HENT:     Head: Normocephalic and atraumatic.     Right Ear: Hearing and external ear normal.     Left Ear: Hearing and external ear normal.     Nose: Nose normal. No mucosal edema or rhinorrhea.     Right Turbinates: Not enlarged.     Left Turbinates: Not enlarged.     Mouth/Throat:     Mouth: Mucous membranes are dry.     Pharynx: Oropharynx is clear. No oropharyngeal exudate.  Eyes:     Extraocular Movements: Extraocular movements intact.     Pupils: Pupils are equal, round, and reactive to light.     Right eye: Pupil is not sluggish.     Left eye: Pupil is not sluggish.  Cardiovascular:     Rate and Rhythm: Normal rate and regular rhythm.     Pulses: Normal  pulses.     Heart sounds:  Normal heart sounds. No murmur heard.   Pulmonary:     Effort: Pulmonary effort is normal.     Breath sounds: Normal breath sounds. No decreased breath sounds, wheezing or rales.  Musculoskeletal:     Cervical back: Normal range of motion.     Right lower leg: Edema (Trace) present.     Left lower leg: Edema (Trace) present.  Lymphadenopathy:     Cervical: No cervical adenopathy.  Skin:    General: Skin is warm and dry.     Capillary Refill: Capillary refill takes less than 2 seconds.     Findings: No erythema or rash.  Neurological:     General: No focal deficit present.     Mental Status: He is alert and oriented to person, place, and time.     Motor: No weakness.     Coordination: Coordination normal.     Gait: Gait is intact. Gait normal.  Psychiatric:        Mood and Affect: Mood normal.        Behavior: Behavior normal. Behavior is cooperative.        Thought Content: Thought content normal.        Judgment: Judgment normal.       Assessment & Plan:   Essential hypertension Hypertensive on exam today Patient recently seen in the emergency room with systolic in 696E, diastolic remains above 952  Discussion: Reviewed case with Dr. Melvyn Novas over the phone. Dr. Melvyn Novas would still like the patient seen by [facility] as there is concerns for renal artery stenosis. I have sent a message to our patient care coordinators to follow-up regarding this referral. For the temporary management of better control of his blood pressure I reviewed with patient that we can start losartan 50 mg daily. Patient will need to have a c-Met in 1 week to monitor kidney functioning. We already have a baseline from the emergency room. I reviewed with patient that although it is not recommended that he travel to [place] with his blood pressures at the level they are if he is still going to do so he can always increase his minoxidil. I have reviewed this with Dr. Melvyn Novas he is  agreeable.  Plan: Start losartan 50 mg daily If blood pressure remains elevated can increase minoxidil We will follow up with patient care coordinators regarding referral to [facility] Keep 2-week follow-up with Dr. Melvyn Novas 1 week lab appointment to check kidney function after starting losartan    Return in about 2 weeks (around 11/05/2019), or if symptoms worsen or fail to improve, for Follow up with Dr. Melvyn Novas.   Lauraine Rinne, NP 10/23/2019   This appointment required 34 minutes of patient care (this includes precharting, chart review, review of results, face-to-face care, etc.).

## 2019-10-23 NOTE — Assessment & Plan Note (Signed)
Hypertensive on exam today Patient recently seen in the emergency room with systolic in 761Y, diastolic remains above 073  Discussion: Reviewed case with Dr. Melvyn Novas over the phone. Dr. Melvyn Novas would still like the patient seen by [facility] as there is concerns for renal artery stenosis. I have sent a message to our patient care coordinators to follow-up regarding this referral. For the temporary management of better control of his blood pressure I reviewed with patient that we can start losartan 50 mg daily. Patient will need to have a c-Met in 1 week to monitor kidney functioning. We already have a baseline from the emergency room. I reviewed with patient that although it is not recommended that he travel to [place] with his blood pressures at the level they are if he is still going to do so he can always increase his minoxidil. I have reviewed this with Dr. Melvyn Novas he is agreeable.  Plan: Start losartan 50 mg daily If blood pressure remains elevated can increase minoxidil We will follow up with patient care coordinators regarding referral to [facility] Keep 2-week follow-up with Dr. Melvyn Novas 1 week lab appointment to check kidney function after starting losartan

## 2019-10-26 NOTE — Telephone Encounter (Signed)
Pt also wanted to have message sent to [place]. Routing this to [place] as an FYI per pt request.

## 2019-10-26 NOTE — Telephone Encounter (Signed)
Dr. [name], please see pt's mychart message. 

## 2019-10-26 NOTE — Telephone Encounter (Signed)
Dr. Sherene Sires, please see new mychart message from pt.

## 2019-10-28 ENCOUNTER — Ambulatory Visit (INDEPENDENT_AMBULATORY_CARE_PROVIDER_SITE_OTHER): Payer: BC Managed Care – PPO | Admitting: Orthopaedic Surgery

## 2019-10-28 ENCOUNTER — Other Ambulatory Visit: Payer: Self-pay

## 2019-10-28 ENCOUNTER — Encounter: Payer: Self-pay | Admitting: Orthopaedic Surgery

## 2019-10-28 VITALS — Ht 76.0 in | Wt 204.0 lb

## 2019-10-28 DIAGNOSIS — M7062 Trochanteric bursitis, left hip: Secondary | ICD-10-CM | POA: Diagnosis not present

## 2019-10-28 MED ORDER — METHYLPREDNISOLONE ACETATE 40 MG/ML IJ SUSP
40.0000 mg | INTRAMUSCULAR | Status: AC | PRN
  Administered 2019-10-28: 40 mg via INTRA_ARTICULAR

## 2019-10-28 MED ORDER — LIDOCAINE HCL 1 % IJ SOLN
3.0000 mL | INTRAMUSCULAR | Status: AC | PRN
  Administered 2019-10-28: 3 mL

## 2019-10-28 NOTE — Telephone Encounter (Signed)
Arlys John please advise on pt's My-Chart message per pt request. He stated you saw him in clinic last week. Thank you.   Kathlene November, I need some guidance on some recent headaches. I have now had three headaches in the last 3 nights, all localized in my right temple and each occurring around midnight. They feel like the beginning of a cluster headache cycle, which I seem to get every few years. So far they have each gone away without drugs by getting into a warm shower for 15-20 minutes, which has not been typical of the clusters in the past. In a cluster cycle, I would normally stop Norvasc (which I'm not taking right now), add Verapamil, and use Imitrex injections for the headaches each day. The cycle normally runs for a couple of weeks. Do you have any ideas or suggestions about whether my new BP drug mix either (1) might be contributing to the headaches or (2) should cause me to avoid the Verapamil/Imitrex mix for the headaches? BP yesterday was good; BP today has been 150/105-ish. Took a Tenex a few minutes ago. Thanks for your help.  Irving Burton, please send this to Arlys John also. Thanks. Trip

## 2019-10-28 NOTE — Progress Notes (Signed)
Office Visit Note   Patient: [name]           Date of Birth: 1950-06-11           MRN: [id number] Visit Date: 10/28/2019              Requested by: Nyoka Cowden, MD [id number] [place],  [place] [address] PCP: Nyoka Cowden, MD   Assessment & Plan: Visit Diagnoses:  1. Trochanteric bursitis, left hip     Plan: His signs and symptoms seem to be consistent most with trochanteric bursitis and IT band syndrome.  I explained this to him in detail.  I recommended Voltaren gel as a topical anti-inflammatory as well as showed him some stretching exercises to try.  I also recommended a steroid injection over the area of maximal tenderness and he agreed to this and tolerated well.  I explained the risk and benefits of injections.  All questions and concerns were answered and addressed.  Follow-up will be as needed.  Neck step would be likely formal physical therapy and considering a repeat injection if things do not get better.  Follow-Up Instructions: Return if symptoms worsen or fail to improve.   Orders:  Orders Placed This Encounter  Procedures  . Large Joint Inj   No orders of the defined types were placed in this encounter.     Procedures: Large Joint Inj: L greater trochanter on 10/28/2019 5:17 PM Indications: pain and diagnostic evaluation Details: 22 G 1.5 in needle, lateral approach  Arthrogram: No  Medications: 3 mL lidocaine 1 %; 40 mg methylPREDNISolone acetate 40 MG/ML Outcome: tolerated well, no immediate complications Procedure, treatment alternatives, risks and benefits explained, specific risks discussed. Consent was given by the patient. Immediately prior to procedure a time out was called to verify the correct patient, procedure, equipment, support staff and site/side marked as required. Patient was prepped and draped in the usual sterile fashion.       Clinical Data: No additional findings.   Subjective: Chief Complaint  Patient  presents with  . Left Hip - Pain  Patient is a very pleasant 69 year old gentleman that I am seeing for the first time as a patient but I did perform a hip replacement on his wife.  He has been dealing with left hip pain since about the end of June of this year after a long car ride.  He has no actual groin pain the most the pains inside of his hip.  She denies any back pain or radicular symptoms.  X-rays were actually done in the emergency room recently he was there asked for blood pressure issues.  He is only been able to take some Tylenol for pain which does take the edge off of it.  He cannot take anti-inflammatories due to blood pressure issues.  He denies any other acute change in medical status.  HPI  Review of Systems He currently denies any headache, chest pain, short of breath, fever, chills, nausea, vomiting  Objective: Vital Signs: Ht 6\' 4"  (1.93 m)   Wt 204 lb (92.5 kg)   BMI 24.83 kg/m   Physical Exam He is alert and orient x3 and in no acute distress Ortho Exam Examination of both hips show the move smoothly and fluidly with no pain in the groin at all.  Examination of his left hip when I have him lay on the side he has pain over the trochanteric area and the proximal IT band  only.  There is no ischial pain and no sciatic symptoms. Specialty Comments:  No specialty comments available.  Imaging: No results found. X-rays independently reviewed of the pelvis and hip does show just some slight arthritic changes in the left hip comparing left and right sides but is only slight.  There is some cortical irregularity around the tip of the greater trochanter to suggest probably tendinitis and bursitis.  He does have significant degenerative disc disease of the lower lumbar spine.  PMFS History: Patient Active Problem List   Diagnosis Date Noted  . Drug-induced gout 01/23/2019  . Bilateral high frequency sensorineural hearing loss 02/25/2018  . Tremor 11/08/2016  . Epistaxis  05/14/2016  . Chronic renal disease, stage I 04/19/2016  . Syncope 04/27/2015  . Acute URI 01/11/2015  . Hyperlipidemia LDL goal <130 10/18/2014  . Health care maintenance 04/10/2013  . PREMATURE VENTRICULAR CONTRACTIONS, FREQUENT 05/24/2010  . PAROXYSMAL ATRIAL FIBRILLATION 03/17/2010  . SYNCOPE 07/21/2008  . ATTENTION DEFICIT DISORDER, ADULT 07/24/2007  . Essential hypertension 07/24/2007  . Diverticulosis 07/24/2007  . BPH (benign prostatic hyperplasia) 07/24/2007   Past Medical History:  Diagnosis Date  . Adult attention deficit disorder   . Benign prostatic hypertrophy   . Bradycardia   . Diverticulitis of colon    colonoscopy 07/11/2000  . HBP (high blood pressure)    change flomax to cardura for bp in December 2011  . Nephrolithiasis    prostatitis with noncaseating granulomatous inflammation July 2000 negative IPPD.    [name] Syncope     Family History  Problem Relation Age of Onset  . Heart disease Mother 59       IHD with CABG  . Kidney failure Father   . Hypertension Father   . Ehlers-Danlos syndrome Child   . Colon cancer Neg Hx     Past Surgical History:  Procedure Laterality Date  . CYSTOSTOMY W/ BLADDER BIOPSY     from trigone  . TONSILLECTOMY  1958  . US ECHOCARDIOGRAPHY  07/06/08   Social History   Occupational History  . Occupation: Scientist, research (medical): [employer]  Tobacco Use  . Smoking status: Never Smoker  . Smokeless tobacco: Never Used  Vaping Use  . Vaping Use: Never used  Substance and Sexual Activity  . Alcohol use: Yes    Alcohol/week: 3.0 standard drinks    Types: 3 Glasses of wine per week  . Drug use: No  . Sexual activity: Not on file

## 2019-10-28 NOTE — Telephone Encounter (Signed)
Please see my chart message from Dr. Sherene Sires responding to the patient.  Elisha Headland, FNP

## 2019-10-28 NOTE — Telephone Encounter (Signed)
Does not look like this has been responded to yet.  Sorry for the delay as I was on vacation.  Would recommend that the patient proceed forward with increasing minoxidil as discussed and instructed on your last AVS:  Continue minoxidil 2.5 mg take 1 tablet by mouth twice daily >>> Reviewed with Dr. Sherene Sires.  If you start to have elevated blood pressures on your trip can increase to taking 1 tablet every 6-8 hours  We will also route message back to [facility] pool as I had previously sent a message on 10/22/2019 requesting update regarding patient's nephrology referral.  Will also route to Dr. Sherene Sires.   Elisha Headland, FNP

## 2019-10-28 NOTE — Telephone Encounter (Signed)
We will send message back to [facility] to see if they can get an update on this.  Will send messages high-priority  Elisha Headland, FNP

## 2019-10-28 NOTE — Telephone Encounter (Signed)
Dr. Sherene Sires please advise on pt's My-Chart meassage. Thank you    [name], I need some guidance on some recent headaches. I have now had three headaches in the last 3 nights, all localized in my right temple and each occurring around midnight. They feel like the beginning of a cluster headache cycle, which I seem to get every few years. So far they have each gone away without drugs by getting into a warm shower for 15-20 minutes, which has not been typical of the clusters in the past. In a cluster cycle, I would normally stop Norvasc (which I'm not taking right now), add Verapamil, and use Imitrex injections for the headaches each day. The cycle normally runs for a couple of weeks. Do you have any ideas or suggestions about whether my new BP drug mix either (1) might be contributing to the headaches or (2) should cause me to avoid the Verapamil/Imitrex mix for the headaches? BP yesterday was good; BP today has been 150/105-ish. Took a Tenex a few minutes ago. Thanks for your help.  [name], please send this to [name] also. Thanks. [name]

## 2019-10-29 NOTE — Telephone Encounter (Signed)
Dr. Sherene Sires, Please see patient's comment and advise.  Thank you.

## 2019-10-29 NOTE — Telephone Encounter (Signed)
Thank you for letting me know.  We will also route message to Dr. Sherene Sires is FYI.  Elisha Headland, FNP

## 2019-10-29 NOTE — Telephone Encounter (Signed)
[name] this patient has an appt on 11/02/2019 @ 10:00am with Dr Anthony Sar At [facility]

## 2019-10-30 NOTE — Telephone Encounter (Signed)
Dr. Sherene Sires, Just an FYI:  Pt.  Has an Appt with Dr Thedore Mins at [facility] on Mon am.

## 2019-11-05 ENCOUNTER — Other Ambulatory Visit: Payer: Self-pay | Admitting: Nephrology

## 2019-11-05 DIAGNOSIS — N1831 Chronic kidney disease, stage 3a: Secondary | ICD-10-CM

## 2019-11-10 ENCOUNTER — Other Ambulatory Visit: Payer: Self-pay

## 2019-11-10 ENCOUNTER — Encounter: Payer: Self-pay | Admitting: Internal Medicine

## 2019-11-10 ENCOUNTER — Ambulatory Visit (INDEPENDENT_AMBULATORY_CARE_PROVIDER_SITE_OTHER): Payer: BC Managed Care – PPO | Admitting: Internal Medicine

## 2019-11-10 DIAGNOSIS — I10 Essential (primary) hypertension: Secondary | ICD-10-CM | POA: Diagnosis not present

## 2019-11-10 DIAGNOSIS — R251 Tremor, unspecified: Secondary | ICD-10-CM

## 2019-11-10 DIAGNOSIS — M1020 Drug-induced gout, unspecified site: Secondary | ICD-10-CM

## 2019-11-10 DIAGNOSIS — G44009 Cluster headache syndrome, unspecified, not intractable: Secondary | ICD-10-CM | POA: Insufficient documentation

## 2019-11-10 DIAGNOSIS — G44019 Episodic cluster headache, not intractable: Secondary | ICD-10-CM

## 2019-11-10 MED ORDER — VERAPAMIL HCL 120 MG PO TABS: 120.0000 mg | ORAL_TABLET | Freq: Three times a day (TID) | ORAL | 11 refills | Status: DC

## 2019-11-10 NOTE — Assessment & Plan Note (Signed)
Onset 2014 / head bob only > eval by neuro 12/04/2016 > Dr  Tat >>> meds not recommended as not usually useful    No progression

## 2019-11-10 NOTE — Patient Instructions (Addendum)
No change in medications for now > defer all blood pressure medication changes to renal medicine   We will be referring you to [facility] [place] - call me for any problems other than your blood pressure

## 2019-11-10 NOTE — Progress Notes (Signed)
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 no change in rx.      05/15/2016 acute extended ov/[name] re: new onset epistaxis  Chief Complaint  Patient presents with  . Follow-up    over the last 2 weeks he has had spontanous nosebleeds, usually at night, dizziness when getting out of bed, appt with ENT is 05/30/16, blood pressure well controlled but lately his bp has been running high  acute onset avg qod worse lying down all from R side / took Asprin 05/13/16 > total amt of blood probaby one half cup since onset  Also daily light headedness standing  rec Increase tenex to 1 and a half daily and hold the aspirin until no bleeding and restart at 81 mg daily     11/08/2016  f/u ov/[name] re:  Back on baby asa no more nose bleeds  Chief Complaint  Patient presents with  . Follow-up    pt doing well today, no complaints.     head tremor new cc x sev years, slt worse, neg fm hx tremor rec Referred to neuro > Tat > observation     05/15/2018  f/u ov/[name] re: hbp/  Chief Complaint  Patient presents with  . Follow-up    Pt c/o increased belching for the past several months.   Dyspnea:  Walking at lunch x 1.5 mile if good weather, does 5 flights x 30 min if bad Cough: no Sleeping: no flat / 2 pillows  no tia /claudication rec Please remember to go to the lab department   for your tests - we will call you with the results when they are available. Return for cpx end of august 2020    11/14/2018  f/u ov/[name] re: cpx  hbp  Chief Complaint  Patient presents with  . Follow-up     Patient reports that he is doing well at this time.  Dyspnea:  tol spin bike x 20 min almost daily s cp  Cough: no Sleeping: able to lie down flat comfortably  SABA use: no 02: no rec No change rx   01/22/2019 notified gout/ started allopurinol and stopped hctz   02/09/2019  f/u ov/[name] re:  advil x 3  Twice daily, maybe three Chief Complaint  Patient presents with  . Follow-up    gout flare great left toe   Dyspnea:  none Cough: none Sleeping: fine  SABA use: none  02: none  L great toe worse  X one week p initial resp to steroid injection and allopurinol at 100 mg daily/ off hctz x 2 weeks  rec Please remember to go to the lab department   for your tests - we will call you with the results when they are available. Goal is to keep your uric acid level below 6  Keep your previous appt 05/18/19     05/12/2019  f/u ov/[name] re:  Hbp, gout better,no more steroid injections or nsaid need  Chief Complaint  Patient presents with  .  Follow-up    Doing well and no new co's- still has not used pred taper given in case gout flare.   Dyspnea:  Not limited by breathing from desired activities  But very sedentary Cough: none  Sleeping: some snoring on back / no hypersomnolence  SABA use: none 02: none  rec Ok to reduce lasix to one half daiy    10/22/19 NP eval fo HBP rec 1. Essential hypertension  losartan (COZAAR) 50 MG tablet; Take 1 tablet (50 mg total) by mouth daily.  Dispense: 30 tablet; Refill: 0 Continue Cardura 4 mg 1 tablet daily Continue furosemide 20 mg 1 tablet daily Continue minoxidil 2.5 mg take 1 tablet by mouth twice daily   11/10/2019  f/u ov/[name] re: hbp / HA typical of his cluster pattern started verapamil and half dose toprol = 25 mg daily and losartan changed to telmisartan by renal  Chief Complaint  Patient presents with  . Follow-up    pt has blood pressure issues and headache cluster cycle  Dyspnea:  Some with steps  Cough: none  Sleeping: bed is  flat/ 3 pillows no change baseline  SABA use: none  02: none    No obvious day to day or daytime variability or assoc excess/ purulent sputum or mucus plugs or hemoptysis or cp or chest tightness, subjective wheeze or overt sinus or hb symptoms.   Feels sleeps fine  without nocturnal  or early am exacerbation  of respiratory  c/o's or need for noct saba. Also denies any obvious fluctuation of symptoms with weather or environmental changes or other aggravating or alleviating factors except as outlined above   No unusual exposure hx or h/o childhood pna/ asthma or knowledge of premature birth.  Current Allergies, Complete Past Medical History, Past Surgical History, Family History, and Social History were reviewed in [employer] record.  ROS  The following are not active complaints unless bolded Hoarseness, sore throat, dysphagia, dental problems, itching, sneezing,  nasal congestion or discharge of excess mucus or purulent secretions, ear ache,   fever, chills, sweats, unintended wt loss or wt gain, classically pleuritic or exertional cp,  orthopnea pnd or arm/hand swelling  or leg swelling, presyncope, palpitations, abdominal pain, anorexia, nausea, vomiting, diarrhea  or change in bowel habits or change in bladder habits, change in stools or change in urine, dysuria, hematuria,  rash, arthralgias, visual complaints, headache, numbness, weakness or ataxia or problems with walking or coordination,  change in mood or  Memory. Tremor and dry mouth        Current Meds  Medication Sig  . acetaminophen (TYLENOL) 500 MG tablet Take 1,000 mg by mouth every 6 (six) hours as needed for mild pain.  [name] allopurinol (ZYLOPRIM) 300 MG tablet Take 1 tablet (300 mg total) by mouth daily.  [name] aspirin EC 81 MG tablet Take 1 tablet (81 mg total) by mouth daily.  . cholecalciferol (VITAMIN D3) 25 MCG (1000 UNIT) tablet Take 1,000 Units by mouth daily.  [name] doxazosin (CARDURA) 4 MG tablet Take 1  tablet (4 mg total) by mouth 2 (two) times daily. (Patient taking differently: Take 4 mg by mouth once. Pt only taking once a day)  . finasteride (PROSCAR) 5 MG tablet Take 5 mg by mouth daily.    . furosemide (LASIX) 20 MG tablet Take 1 tablet (20 mg total) by mouth daily. (Patient taking differently: Take 20 mg by mouth daily. Can take extra 20 MG if needed for fluid)  .  guanFACINE (TENEX) 1 MG tablet TAKE ONE AND ONE-HALF TABLETS AT BEDTIME (Patient taking differently: Take 1.5 mg by mouth at bedtime. )  . ibuprofen (ADVIL) 200 MG tablet Take 600-800 mg by mouth every 6 (six) hours as needed for moderate pain.  [name] lidocaine (LIDODERM) 5 % Place 1 patch onto the skin daily. Remove & Discard patch within 12 hours or as directed by MD  . metoprolol succinate (TOPROL-XL) 50 MG 24 hr tablet TAKE 1 TABLET DAILY. TAKE WITH OR IMMEDIATELY FOLLOWING A MEAL (Patient taking differently: Take 50 mg by mouth daily. Pt takes 1/2 tab)  . minoxidil (LONITEN) 2.5 MG tablet Take 1 tablet (2.5 mg total) by mouth 2 (two) times daily.  . sildenafil (REVATIO) 20 MG tablet Take 20 mg by mouth daily as needed.   . verapamil (CALAN) 120 MG tablet Take 120 mg by mouth 3 (three) times daily.           Past Medical History:  HYPERTENSION (ICD-401.9) ........................[name] Renal medicine/ Singh GOUT on hctz 12/2018 .................................... Podiatry / Regal [name]  SYNCOPE (ICD-780.2)  DIVERTICULITIS OF COLON (ICD-562.11)..........................................................Marland KitchenLina Sar  - colonoscopy 07/11/2000 > reminder letter sent 08/03/10> reminded 03/20/2012  Health Maintenance...........................................................................................[name]  Referred to IM 11/10/2019  - DT November 10, 2008 and 11/14/2018  -Prevnar 13 10/12/2014 , pneumoccal vaccine 09/2015  - CPX 11/14/2018   NEPHROLITHIASIS/BPH......................................................................................[name] Dr. [name]  - prostatitis with noncaseating granulomatous inflammation July 2000 negative IPPD  ADULT ADD.........................................................................................................Marland KitchenDr [name]     Family History:  HBP/ kidney failure father  IHD with cabg at 15 mother  one younger sister healthy no problems    Social History:  The patient lives at home with his wife. 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 He drinks 3  glasses of wine per week.  never smoker         Objective:   Physical Exam     11/10/2019 211 05/12/2019  207  11/14/2018  203  wt  207 November 10, 2008 >  > 02/21/2011  207>    11/08/2016  200 > 09/16/2017  208   > 11/12/2017  205 >  05/15/2018   210 > 02/09/2019  210    05/15/18 210 lb (95.3 kg)  11/12/17 205 lb (93 kg)  09/16/17 208 lb 6.4 oz (94.5 kg)    Vital signs reviewed  11/10/2019  - Note at rest 02 sats  98% on RA  And bp 12-/76 with pulse 82   amb wm tremor in voice   HEENT : pt wearing mask not removed for exam due to covid -19 concerns.    NECK :  without JVD/Nodes/TM/ nl carotid upstrokes bilaterally   LUNGS: no acc muscle use,  Nl contour chest which is clear to A and P bilaterally without cough on insp or exp maneuvers   CV:  RRR  no s3 or murmur or increase in P2, and no edema   ABD:  soft and nontender with nl inspiratory excursion in the supine position. No bruits or organomegaly appreciated, bowel sounds nl  MS:  Nl gait/ ext warm without deformities, calf tenderness, cyanosis or clubbing No obvious joint restrictions   SKIN: warm and dry without lesions    NEURO:  alert, approp, nl sensorium with  no motor or cerebellar deficits apparent.             Assessment & Plan:

## 2019-11-10 NOTE — Assessment & Plan Note (Signed)
Previously under the care of [facility] neuro > referred to [facility] neuro 11/10/2019    Refilled verapamil 11/10/2019 but further rx per neuro/ IM

## 2019-11-10 NOTE — Assessment & Plan Note (Signed)
Gout L great toe developed 7/ 2020 with UA = 7.7 on 01/12/19 while on hctz/ d/c'd 01/22/2019 > level 6.2 on 02/09/2019 on allopurinol 100/diet so increased allopurinol to 300 mg daily > level 5.4 05/12/2019 so no change rx   Adequate control on present rx, reviewed in detail with pt > no change in rx needed

## 2019-11-10 NOTE — Assessment & Plan Note (Addendum)
-    Referred to [facility]  / Renal medicine 10/2019  -  MRA 11/27/19 >>>  Lab Results  Component Value Date   CREATININE 1.30 (H) 10/15/2019   CREATININE 1.26 05/12/2019   CREATININE 1.25 02/09/2019     Strongly suspect RAS at this point and defer further w/u and Rx to Renal but for now bp has improved albeit on a very complex regimen.   Pt requesting establish with PCP > referred to [facility]

## 2019-11-18 ENCOUNTER — Other Ambulatory Visit: Payer: Self-pay | Admitting: Pulmonary Disease

## 2019-11-18 DIAGNOSIS — I10 Essential (primary) hypertension: Secondary | ICD-10-CM

## 2019-11-25 ENCOUNTER — Other Ambulatory Visit: Payer: Self-pay | Admitting: Nephrology

## 2019-11-25 DIAGNOSIS — E278 Other specified disorders of adrenal gland: Secondary | ICD-10-CM

## 2019-11-25 DIAGNOSIS — I129 Hypertensive chronic kidney disease with stage 1 through stage 4 chronic kidney disease, or unspecified chronic kidney disease: Secondary | ICD-10-CM

## 2019-11-27 ENCOUNTER — Ambulatory Visit
Admission: RE | Admit: 2019-11-27 | Discharge: 2019-11-27 | Disposition: A | Discharge status: Home / Self Care | Payer: BC Managed Care – PPO | Source: Ambulatory Visit | Attending: Nephrology | Admitting: Nephrology

## 2019-11-27 ENCOUNTER — Other Ambulatory Visit: Payer: Self-pay

## 2019-11-27 DIAGNOSIS — I129 Hypertensive chronic kidney disease with stage 1 through stage 4 chronic kidney disease, or unspecified chronic kidney disease: Secondary | ICD-10-CM

## 2019-11-27 DIAGNOSIS — N1831 Chronic kidney disease, stage 3a: Secondary | ICD-10-CM

## 2019-11-27 DIAGNOSIS — E278 Other specified disorders of adrenal gland: Secondary | ICD-10-CM

## 2019-11-27 MED ORDER — GADOBENATE DIMEGLUMINE 529 MG/ML IV SOLN
19.0000 mL | Freq: Once | INTRAVENOUS | Status: AC | PRN
  Administered 2019-11-27: 19 mL via INTRAVENOUS

## 2019-12-08 ENCOUNTER — Ambulatory Visit: Payer: BC Managed Care – PPO | Attending: Internal Medicine

## 2019-12-08 DIAGNOSIS — Z23 Encounter for immunization: Secondary | ICD-10-CM

## 2019-12-08 NOTE — Progress Notes (Signed)
   Covid-19 Vaccination Clinic  Name:  [name]    MRN: [id number] DOB: 12/03/1950  12/08/2019  [name] was observed post Covid-19 immunization for 15 minutes without incident. He was provided with Vaccine Information Sheet and instruction to access the V-Safe system.   [name] was instructed to call 911 with any severe reactions post vaccine: [name] Difficulty breathing  . Swelling of face and throat  . A fast heartbeat  . A bad rash all over body  . Dizziness and weakness

## 2019-12-10 ENCOUNTER — Encounter: Payer: Self-pay | Admitting: Internal Medicine

## 2019-12-10 ENCOUNTER — Other Ambulatory Visit: Payer: Self-pay

## 2019-12-10 ENCOUNTER — Ambulatory Visit (INDEPENDENT_AMBULATORY_CARE_PROVIDER_SITE_OTHER): Payer: BC Managed Care – PPO | Admitting: Internal Medicine

## 2019-12-10 VITALS — BP 124/62 | HR 53 | Ht 74.0 in | Wt 208.0 lb

## 2019-12-10 DIAGNOSIS — R55 Syncope and collapse: Secondary | ICD-10-CM | POA: Diagnosis not present

## 2019-12-10 DIAGNOSIS — I10 Essential (primary) hypertension: Secondary | ICD-10-CM | POA: Diagnosis not present

## 2019-12-10 DIAGNOSIS — I493 Ventricular premature depolarization: Secondary | ICD-10-CM

## 2019-12-10 NOTE — Progress Notes (Signed)
HPI Mr. Dady returns today after a 4 year absence from our EP clinic. He is a pleasant 69 yo man with a h/o syncope. He has not passed out in over 10 years. He has developed worsening HTN and is under evaluation. He has been placed on multiple medications under the direction of Dr. Sherene Sires. He has minimal palpitations. He denies chest pain or sob and has no limit in his activity.   Allergies  Allergen Reactions  . Amoxicillin Rash     Current Outpatient Medications  Medication Sig Dispense Refill  . acetaminophen (TYLENOL) 500 MG tablet Take 1,000 mg by mouth every 6 (six) hours as needed for mild pain.    [name] allopurinol (ZYLOPRIM) 300 MG tablet Take 1 tablet (300 mg total) by mouth daily. 90 tablet 3  . aspirin EC 81 MG tablet Take 1 tablet (81 mg total) by mouth daily.    . cholecalciferol (VITAMIN D3) 25 MCG (1000 UNIT) tablet Take 1,000 Units by mouth daily.    [name] doxazosin (CARDURA) 4 MG tablet Take 4 mg by mouth daily.    . finasteride (PROSCAR) 5 MG tablet Take 5 mg by mouth daily.      . furosemide (LASIX) 20 MG tablet Take 1 tablet (20 mg total) by mouth daily. 90 tablet 3  . guanFACINE (TENEX) 1 MG tablet TAKE ONE AND ONE-HALF TABLETS AT BEDTIME 135 tablet 3  . ibuprofen (ADVIL) 200 MG tablet Take 600-800 mg by mouth every 6 (six) hours as needed for moderate pain.    . metoprolol succinate (TOPROL-XL) 50 MG 24 hr tablet TAKE 1 TABLET DAILY. TAKE WITH OR IMMEDIATELY FOLLOWING A MEAL 90 tablet 3  . minoxidil (LONITEN) 2.5 MG tablet Take 1 tablet (2.5 mg total) by mouth 2 (two) times daily. 60 tablet 2  . sildenafil (REVATIO) 20 MG tablet Take 20 mg by mouth daily as needed.     [name] telmisartan (MICARDIS) 80 MG tablet Take 80 mg by mouth daily.     No current facility-administered medications for this visit.     Past Medical History:  Diagnosis Date  . Adult attention deficit disorder   . Benign prostatic hypertrophy   . Bradycardia   . Diverticulitis of colon     colonoscopy 07/11/2000  . HBP (high blood pressure)    change flomax to cardura for bp in December 2011  . Nephrolithiasis    prostatitis with noncaseating granulomatous inflammation July 2000 negative IPPD.    [name] Syncope     ROS:   All systems reviewed and negative except as noted in the HPI.   Past Surgical History:  Procedure Laterality Date  . CYSTOSTOMY W/ BLADDER BIOPSY     from trigone  . TONSILLECTOMY  1958  . US ECHOCARDIOGRAPHY  07/06/08     Family History  Problem Relation Age of Onset  . Heart disease Mother 32       IHD with CABG  . Kidney failure Father   . Hypertension Father   . Ehlers-Danlos syndrome Child   . Colon cancer Neg Hx      Social History   Socioeconomic History  . Marital status: Married    Spouse name: Not on file  . Number of children: Not on file  . Years of education: Not on file  . Highest education level: Not on file  Occupational History  . Occupation: Scientist, research (medical): [employer]  Tobacco Use  . Smoking  status: Never Smoker  . Smokeless tobacco: Never Used  Vaping Use  . Vaping Use: Never used  Substance and Sexual Activity  . Alcohol use: Yes    Alcohol/week: 3.0 standard drinks    Types: 3 Glasses of wine per week  . Drug use: No  . Sexual activity: Not on file  Other Topics Concern  . Not on file  Social History Narrative  . Not on file   Social Determinants of Health   Financial Resource Strain:   . Difficulty of Paying Living Expenses: Not on file  Food Insecurity:   . Worried About Programme researcher, broadcasting/film/video in the Last Year: Not on file  . Ran Out of Food in the Last Year: Not on file  Transportation Needs:   . Lack of Transportation (Medical): Not on file  . Lack of Transportation (Non-Medical): Not on file  Physical Activity:   . Days of Exercise per Week: Not on file  . Minutes of Exercise per Session: Not on file  Stress:   . Feeling of Stress : Not on file  Social Connections:   .  Frequency of Communication with Friends and Family: Not on file  . Frequency of Social Gatherings with Friends and Family: Not on file  . Attends Religious Services: Not on file  . Active Member of Clubs or Organizations: Not on file  . Attends Banker Meetings: Not on file  . Marital Status: Not on file  Intimate Partner Violence:   . Fear of Current or Ex-Partner: Not on file  . Emotionally Abused: Not on file  . Physically Abused: Not on file  . Sexually Abused: Not on file     BP 124/62   Pulse (!) 53   Ht 6\' 2"  (1.88 m)   Wt 208 lb (94.3 kg)   SpO2 93%   BMI 26.71 kg/m   Physical Exam:  Well appearing NAD HEENT: Unremarkable Neck:  6 cm JVD, no thyromegally Lymphatics:  No adenopathy Back:  No CVA tenderness Lungs:  Clear with no wheezes HEART:  Regular rate rhythm, no murmurs, no rubs, no clicks Abd:  soft, positive bowel sounds, no organomegally, no rebound, no guarding Ext:  2 plus pulses, no edema, no cyanosis, no clubbing Skin:  No rashes no nodules Neuro:  CN II through XII intact, motor grossly intact   Assess/Plan: 1. Syncope - he has not had any recurrent episodes. He will undergo watchful waiting.  2. Palpitations - he has minimal symptoms. I encouraged him to either consider an Apple watch, Cardia device or check his BP and HR regularly and to call if his resting HR is above 90.  3. HTN - his bp is controlled today and he will continue his current meds.  [place] Damontre Millea,MD

## 2019-12-10 NOTE — Patient Instructions (Addendum)
Medication Instructions:  Your physician recommends that you continue on your current medications as directed. Please refer to the Current Medication list given to you today.  Monitor your heart rate  Labwork: None ordered.  Testing/Procedures: None ordered.  Follow-Up: Your physician wants you to follow-up in: one year with Dr. Ladona Ridgel.   You will receive a reminder letter in the mail two months in advance. If you don't receive a letter, please call our office to schedule the follow-up appointment.  Any Other Special Instructions Will Be Listed Below (If Applicable).  If you need a refill on your cardiac medications before your next appointment, please call your pharmacy.

## 2019-12-21 ENCOUNTER — Telehealth: Payer: Self-pay | Admitting: Internal Medicine

## 2019-12-21 DIAGNOSIS — R0600 Dyspnea, unspecified: Secondary | ICD-10-CM

## 2019-12-21 DIAGNOSIS — R0609 Other forms of dyspnea: Secondary | ICD-10-CM

## 2019-12-21 NOTE — Telephone Encounter (Signed)
New message  Shaquine from Dr Thedore Mins office called in and stated pt called there office today with complaint of exertion and Fatigue.  Dr Thedore Mins wanted to know if Dr Ladona Ridgel would order pt a A ECHO?     Best number for Sha[place] [phone]  Ext [phone]

## 2019-12-23 ENCOUNTER — Other Ambulatory Visit: Payer: Self-pay

## 2019-12-23 ENCOUNTER — Ambulatory Visit (HOSPITAL_COMMUNITY): Payer: BC Managed Care – PPO | Attending: Internal Medicine

## 2019-12-23 DIAGNOSIS — R0600 Dyspnea, unspecified: Secondary | ICD-10-CM

## 2019-12-23 DIAGNOSIS — R0609 Other forms of dyspnea: Secondary | ICD-10-CM

## 2019-12-23 NOTE — Telephone Encounter (Signed)
Left message for Dr. Doristine Church office advising Echo had been ordered.  Sent mychart message to Pt advising echo had been ordered.

## 2019-12-24 LAB — ECHOCARDIOGRAM COMPLETE
Area-P 1/2: 3.19 cm2
S' Lateral: 4.1 cm

## 2020-03-08 ENCOUNTER — Other Ambulatory Visit: Payer: Self-pay | Admitting: Internal Medicine

## 2020-03-29 ENCOUNTER — Other Ambulatory Visit: Payer: BC Managed Care – PPO

## 2020-03-29 DIAGNOSIS — Z20822 Contact with and (suspected) exposure to covid-19: Secondary | ICD-10-CM

## 2020-03-31 LAB — SARS-COV-2, NAA 2 DAY TAT

## 2020-03-31 LAB — NOVEL CORONAVIRUS, NAA: SARS-CoV-2, NAA: NOT DETECTED

## 2020-06-20 NOTE — Telephone Encounter (Signed)
   Pt called to f/u about his mychart message, he is requesting if Dr. Lubertha Basque nurse can give him a call

## 2020-06-20 NOTE — Telephone Encounter (Signed)
Returned call to Pt.  Per Pt he felt his heart racing over the weekend and had some dizziness when using the restroom.  Advised that Dr. Ladona Ridgel had wanted Pt to get an apple watch or kardia device.  Advised we needed to know what arrhythmia Pt may be having.  Advised dizziness can also be related to other things.  Pt will buy apple watch.  Advised to send message once he had watch so he could send [place] rhythm strips.  Pt states Dr. Thedore Mins wanted to discontinue Cardura d/t low blood pressure.    Pt also states he is taking Toprol XL 25 mg daily.

## 2020-10-24 ENCOUNTER — Telehealth: Payer: Self-pay

## 2020-10-24 DIAGNOSIS — R55 Syncope and collapse: Secondary | ICD-10-CM

## 2020-10-24 NOTE — Telephone Encounter (Signed)
Order placed for follow up echo and scheduled follow up visit with Dr. Ladona Ridgel

## 2020-11-08 ENCOUNTER — Other Ambulatory Visit: Payer: Self-pay

## 2020-11-08 ENCOUNTER — Ambulatory Visit (HOSPITAL_COMMUNITY): Payer: Medicare Other | Attending: Internal Medicine

## 2020-11-08 DIAGNOSIS — R55 Syncope and collapse: Secondary | ICD-10-CM | POA: Diagnosis not present

## 2020-11-08 LAB — ECHOCARDIOGRAM COMPLETE
Area-P 1/2: 3.08 cm2
S' Lateral: 3.3 cm

## 2020-12-14 ENCOUNTER — Other Ambulatory Visit: Payer: Self-pay

## 2020-12-14 ENCOUNTER — Ambulatory Visit (INDEPENDENT_AMBULATORY_CARE_PROVIDER_SITE_OTHER): Payer: Medicare Other | Admitting: Internal Medicine

## 2020-12-14 ENCOUNTER — Encounter: Payer: Self-pay | Admitting: Internal Medicine

## 2020-12-14 DIAGNOSIS — R55 Syncope and collapse: Secondary | ICD-10-CM | POA: Diagnosis not present

## 2020-12-14 NOTE — Patient Instructions (Signed)

## 2020-12-14 NOTE — Progress Notes (Signed)
HPI [name] returns today for followup of syncope. He is a pleasant 70 yo man with a h/o syncope. He has not passed out in several years. He denies chest pain or sob and has no limit in his activity.  He describes an episode where he felt funny and became diaphoretic. He did not pass out. He has lost weight and is more active now that he has partially retired.  Allergies  Allergen Reactions   Amoxicillin Rash     Current Outpatient Medications  Medication Sig Dispense Refill   acetaminophen (TYLENOL) 500 MG tablet Take 1,000 mg by mouth every 6 (six) hours as needed for mild pain.     allopurinol (ZYLOPRIM) 300 MG tablet Take 1 tablet (300 mg total) by mouth daily. 90 tablet 3   aspirin EC 81 MG tablet Take 1 tablet (81 mg total) by mouth daily.     cholecalciferol (VITAMIN D3) 25 MCG (1000 UNIT) tablet Take 1,000 Units by mouth daily.     finasteride (PROSCAR) 5 MG tablet Take 5 mg by mouth daily.       furosemide (LASIX) 20 MG tablet TAKE 1 TABLET DAILY 90 tablet 3   guanFACINE (TENEX) 1 MG tablet TAKE ONE AND ONE-HALF TABLETS AT BEDTIME 135 tablet 3   ibuprofen (ADVIL) 200 MG tablet Take 600-800 mg by mouth every 6 (six) hours as needed for moderate pain.     metoprolol succinate (TOPROL-XL) 25 MG 24 hr tablet Take 25 mg by mouth daily.     sildenafil (REVATIO) 20 MG tablet Take 20 mg by mouth daily as needed.      tamsulosin (FLOMAX) 0.4 MG CAPS capsule Take 0.4 mg by mouth 2 (two) times daily.     telmisartan (MICARDIS) 80 MG tablet Take 80 mg by mouth daily.     No current facility-administered medications for this visit.     Past Medical History:  Diagnosis Date   Adult attention deficit disorder    Benign prostatic hypertrophy    Bradycardia    Diverticulitis of colon    colonoscopy 07/11/2000   HBP (high blood pressure)    change flomax to cardura for bp in December 2011   Nephrolithiasis    prostatitis with noncaseating granulomatous inflammation July 2000  negative IPPD.     Syncope     ROS:   All systems reviewed and negative except as noted in the HPI.   Past Surgical History:  Procedure Laterality Date   CYSTOSTOMY W/ BLADDER BIOPSY     from trigone   TONSILLECTOMY  1958   US ECHOCARDIOGRAPHY  07/06/08     Family History  Problem Relation Age of Onset   Heart disease Mother 6       IHD with CABG   Kidney failure Father    Hypertension Father    Ehlers-Danlos syndrome Child    Colon cancer Neg Hx      Social History   Socioeconomic History   Marital status: Married    Spouse name: Not on file   Number of children: Not on file   Years of education: Not on file   Highest education level: Not on file  Occupational History   Occupation: Lawyer    Employer: [employer]  Tobacco Use   Smoking status: Never   Smokeless tobacco: Never  Vaping Use   Vaping Use: Never used  Substance and Sexual Activity   Alcohol use: Yes    Alcohol/week: 3.0  standard drinks    Types: 3 Glasses of wine per week   Drug use: No   Sexual activity: Not on file  Other Topics Concern   Not on file  Social History Narrative   Not on file   Social Determinants of Health   Financial Resource Strain: Not on file  Food Insecurity: Not on file  Transportation Needs: Not on file  Physical Activity: Not on file  Stress: Not on file  Social Connections: Not on file  Intimate Partner Violence: Not on file    BP - 128/74, P - 63, R - 19  Physical Exam:  Well appearing NAD HEENT: Unremarkable Neck:  No JVD, no thyromegally Lymphatics:  No adenopathy Back:  No CVA tenderness Lungs:  Clear with no wheezes HEART:  Regular rate rhythm, no murmurs, no rubs, no clicks Abd:  soft, positive bowel sounds, no organomegally, no rebound, no guarding Ext:  2 plus pulses, no edema, no cyanosis, no clubbing Skin:  No rashes no nodules Neuro:  CN II through XII intact, motor grossly intact  EKG - nsr   Assess/Plan:  1. Syncope  - he has not had any recurrent episodes. He will undergo watchful waiting. I suspect that the etiology of his syncope is due to autonomic dysfunction. 2. Palpitations - he has minimal symptoms. He has purchased an Apple watch but has not documented any arrhythmias 3. HTN - his bp is controlled today and he will continue his current meds. 4. PVC's - his symptoms are well controlled. No change in his meds.  Loman Chroman Rashawnda Gaba,MD

## 2021-02-13 IMAGING — MR MR ABDOMEN WO/W CM
16 of 27 series · 25 of 48 positions shown · IV contrast (multihance)
Comparison: No priors.

CLINICAL DATA: 69-year-old male with history of uncontrolled
hypertension for several days.

EXAM:
MRI ABDOMEN WITHOUT AND WITH CONTRAST/MRA ABDOMEN WITH CONTRAST
TECHNIQUE: Multiplanar multisequence MR imaging of the abdomen was performed
both before and after the administration of intravenous contrast.
CONTRAST:  19mL MULTIHANCE GADOBENATE DIMEGLUMINE 529 MG/ML IV SOLN

[Series 3: cor haste · coronal · 5.0mm · 0.68mm/px · 2 of 27 slices shown (1 of 2)]
[im 1/27]
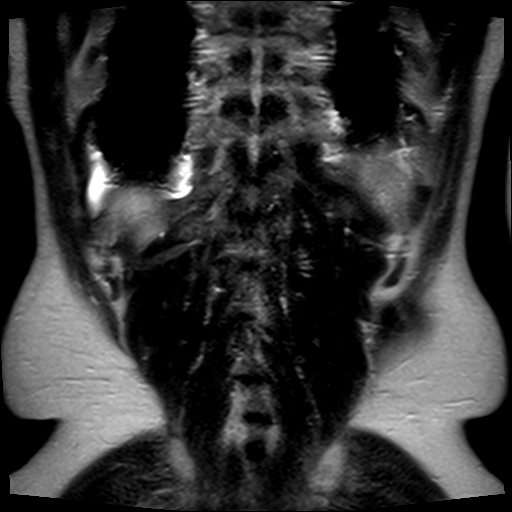
[im 27/27]
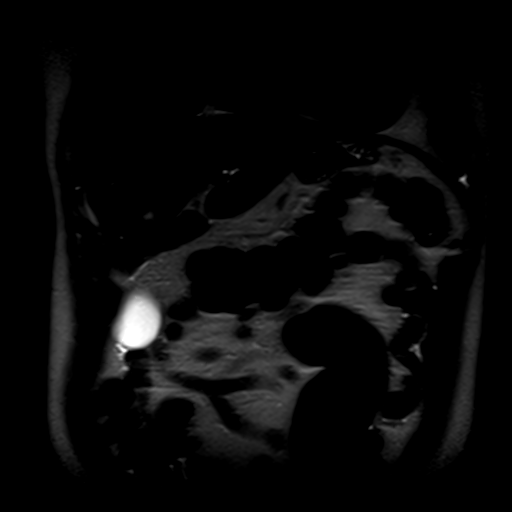

[Series 4: cor haste · coronal · 5.0mm · 0.68mm/px · 2 of 27 slices shown (2 of 2)]
[im 1/27]
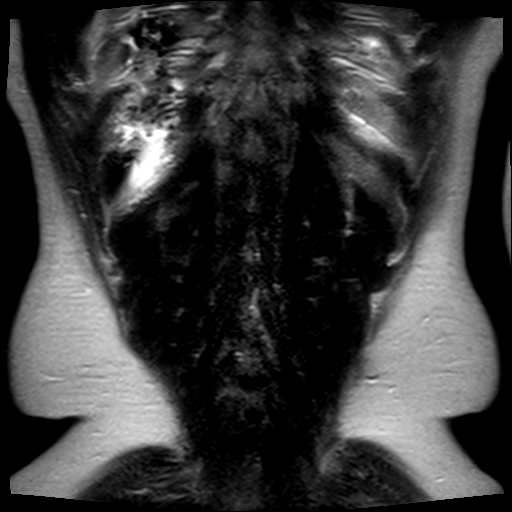
[im 27/27]
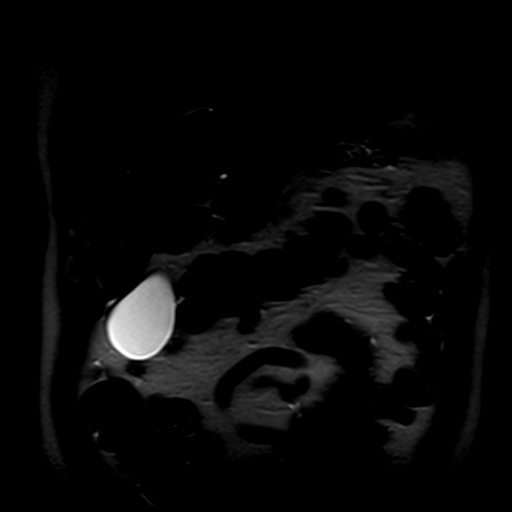

[Series 5: axial haste · axial · 5.0mm · 0.68mm/px · z∈[-82,+94]mm · 2 of 33 slices shown]
[im 1/33]
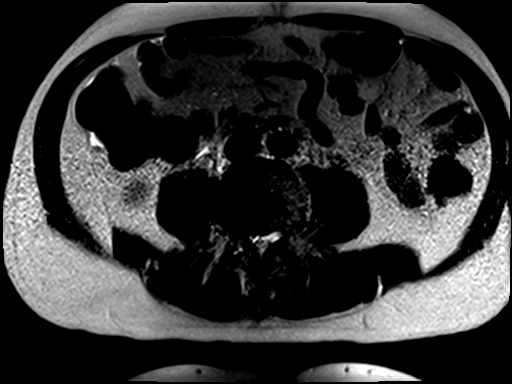
[im 33/33]
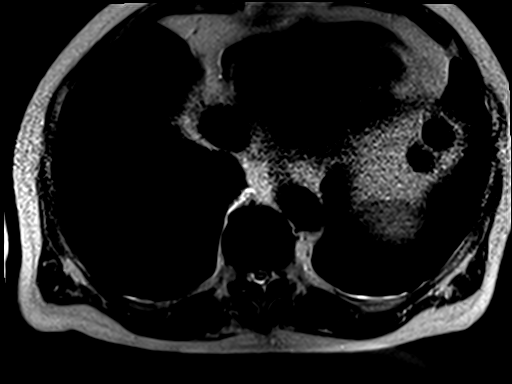

[Series 6: T1 · axial · 5.5mm · 0.68mm/px · z∈[-85,+109]mm · 3 of 66 slices shown (1 of 2)]
[im 1/66]
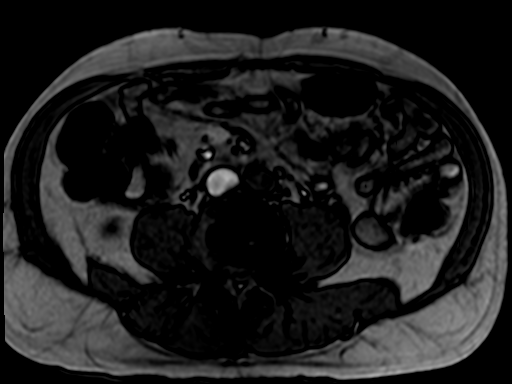
[im 33/66]
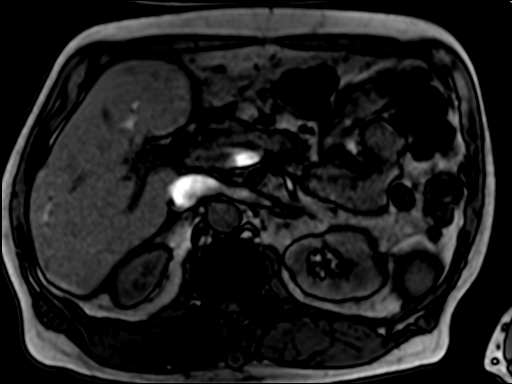
[im 66/66]
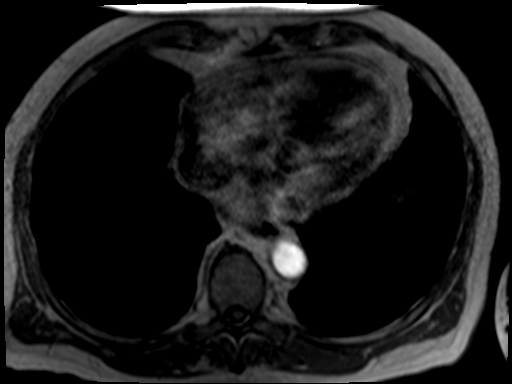

[Series 7: T1 · coronal · 4.0mm · 0.78mm/px · 1 of 44 slices shown (2 of 2)]
[im 1/44]
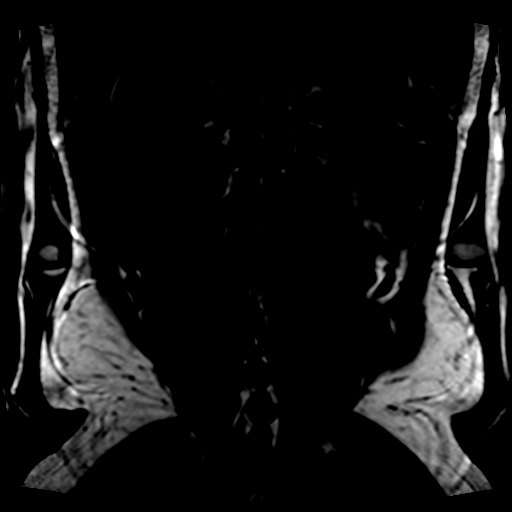

[Series 8: T2 · axial · 5.0mm · 0.46mm/px · 1 of 33 slices shown (1 of 2)]
[im 1/33]
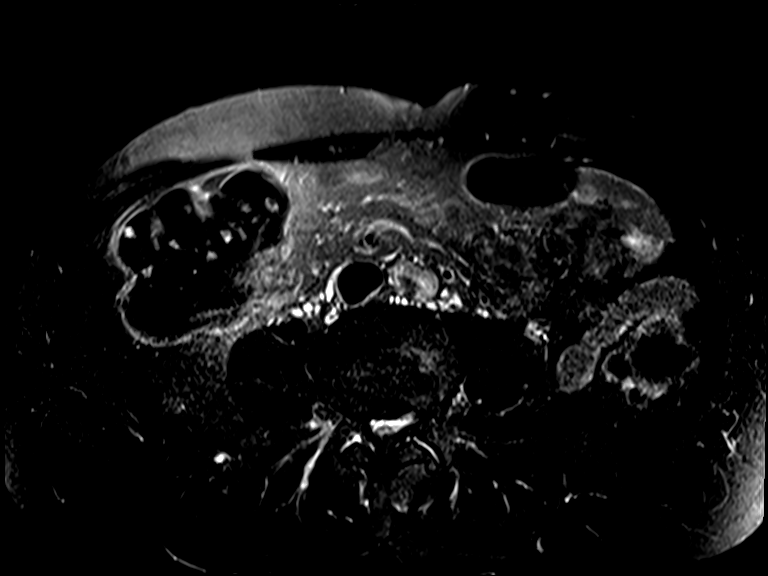

[Series 9: ep2d_diff_b50_500_800_p2_trig · axial · 5.0mm · 1.82mm/px · z∈[-72,+103]mm · 3 of 99 slices shown]
[im 1/99]
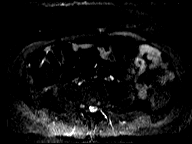
[im 50/99]
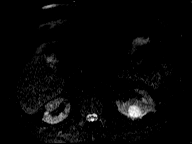
[im 99/99]
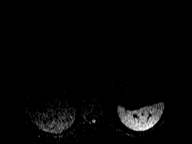

[Series 10: ep2d_diff_b50_500_800_p2_trig_adc · axial · 5.0mm · 1.82mm/px · 1 of 33 slices shown]
[im 1/33]
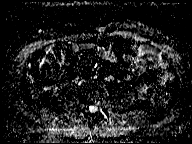

[Series 11: T1 dynamic · axial · non-contrast · 2.3mm · 1.37mm/px · z∈[-88,+75]mm · 2 of 72 slices shown]
[im 1/72]
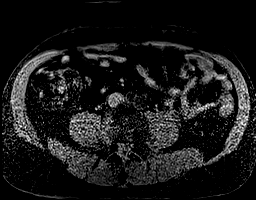
[im 72/72]
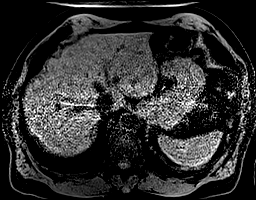

[Series 15: bSSFP · axial · 6.0mm · 0.74mm/px · 1 of 36 slices shown (1 of 2)]
[im 1/36]
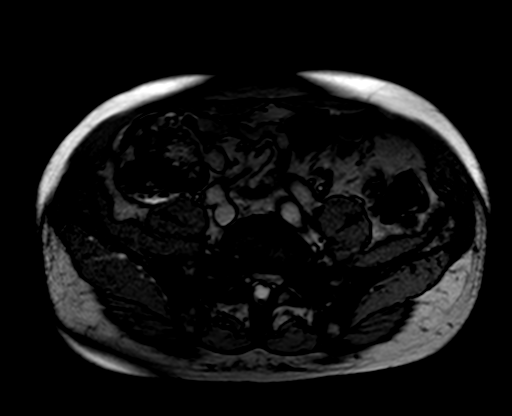

[Series 16: bSSFP · coronal · 5.0mm · 0.78mm/px · 1 of 20 slices shown (2 of 2)]
[im 1/20]
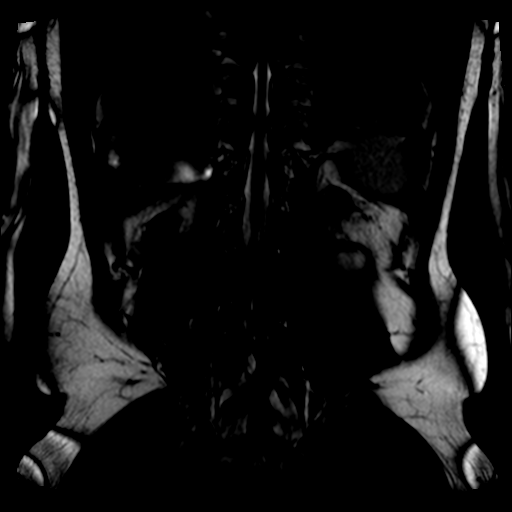

[Series 17: T2 · axial · 5.0mm · 0.99mm/px · 1 of 33 slices shown (2 of 2)]
[im 1/33]
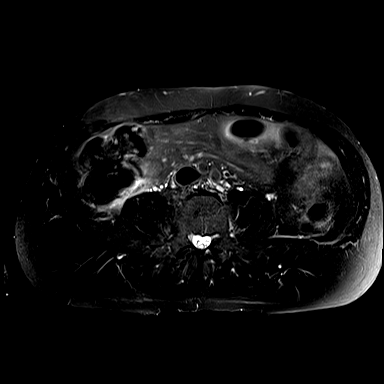

[Series 19: T2 fat-sat · coronal · 5.0mm · 0.74mm/px · 1 of 20 slices shown]
[im 1/20]
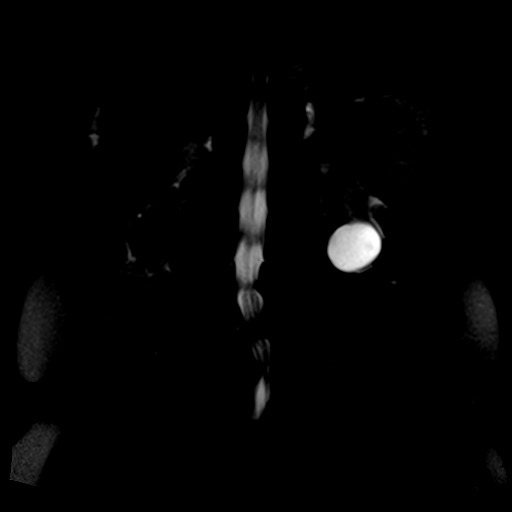

[Series 20: T1 fat-sat · coronal · 5.0mm · 0.74mm/px · 1 of 17 slices shown]
[im 1/17]
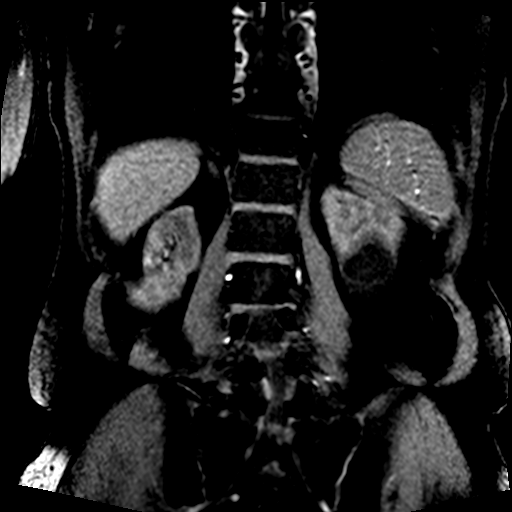

[Series 21: T1 dynamic fat-sat · axial · non-contrast · 3.0mm · 0.78mm/px · z∈[-110,+103]mm · 2 of 72 slices shown]
[im 1/72]
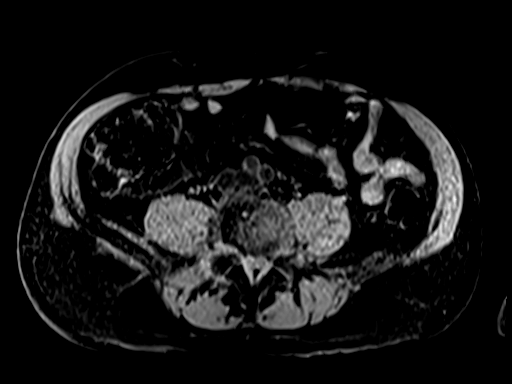
[im 72/72]
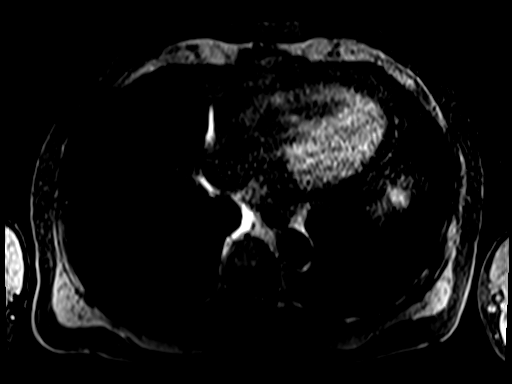

[Series 24: fl3d_cor · coronal · 1.2mm · 0.62mm/px · 1 of 88 slices shown]
[im 1/88]
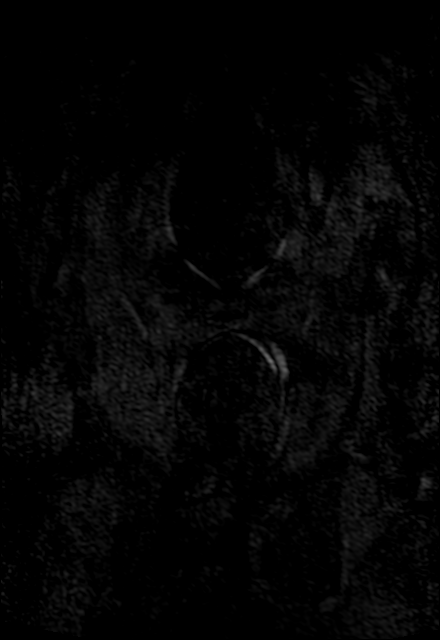

[25 of 48 positions shown; findings below may reference images not displayed]

FINDINGS: Lower chest: Unremarkable.

Hepatobiliary: No suspicious cystic or solid hepatic lesions. No
intra or extrahepatic biliary ductal dilatation. Gallbladder is
normal in appearance.

Pancreas: No pancreatic mass. No pancreatic ductal dilatation. No
pancreatic or peripancreatic fluid collections or inflammatory
changes.

Spleen:  Unremarkable.

Adrenals/Urinary Tract: Multiple T1 hypointense, T2 hyperintense,
nonenhancing lesions are noted in both kidneys, compatible with
simple cysts, largest of which is in the posterior aspect of the
interpolar region of the left kidney measuring 4 cm in diameter. In
addition, in the medial aspect of the lower pole of the left kidney
(axial image 49 of series 21). There is a 1.5 x 1.0 cm T1
hyperintense, T2 hypointense, nonenhancing lesion, compatible with a
proteinaceous/hemorrhagic cyst. No suspicious renal lesions. Mild
atrophy and cortical scarring in the lower pole of the right kidney.
Bilateral adrenal glands are normal in appearance.

Stomach/Bowel: Visualized portions are unremarkable.

Vascular/Lymphatic: No aneurysm identified in the visualized
abdominal vasculature. Bilateral renal arteries are widely patent
and normal in caliber. No lymphadenopathy noted in the abdomen or
pelvis.

Other: No significant volume of ascites noted in the visualized
portions of the peritoneal cavity.

Musculoskeletal: No aggressive appearing osseous lesions are noted
in the visualized portions of the skeleton.
IMPRESSION: 1. No evidence of renal artery stenosis.
2. No adrenal lesion.
3. No acute findings are noted in the abdomen.
4. Multiple Bosniak class 1 and Bosniak class 2 cysts in the kidneys
bilaterally.

## 2021-02-22 ENCOUNTER — Other Ambulatory Visit: Payer: Self-pay | Admitting: Urology

## 2021-07-07 ENCOUNTER — Other Ambulatory Visit: Payer: Self-pay

## 2021-07-07 ENCOUNTER — Encounter (HOSPITAL_BASED_OUTPATIENT_CLINIC_OR_DEPARTMENT_OTHER): Payer: Self-pay

## 2021-07-07 ENCOUNTER — Emergency Department (HOSPITAL_BASED_OUTPATIENT_CLINIC_OR_DEPARTMENT_OTHER)
Admission: EM | Admit: 2021-07-07 | Discharge: 2021-07-08 | Disposition: A | Discharge status: Home / Self Care | Payer: Medicare Other | Attending: Emergency Medicine | Admitting: Emergency Medicine

## 2021-07-07 DIAGNOSIS — Z7982 Long term (current) use of aspirin: Secondary | ICD-10-CM | POA: Diagnosis not present

## 2021-07-07 DIAGNOSIS — R339 Retention of urine, unspecified: Secondary | ICD-10-CM | POA: Diagnosis present

## 2021-07-07 DIAGNOSIS — Z79899 Other long term (current) drug therapy: Secondary | ICD-10-CM | POA: Diagnosis not present

## 2021-07-07 DIAGNOSIS — R319 Hematuria, unspecified: Secondary | ICD-10-CM

## 2021-07-07 DIAGNOSIS — T839XXA Unspecified complication of genitourinary prosthetic device, implant and graft, initial encounter: Secondary | ICD-10-CM

## 2021-07-07 DIAGNOSIS — T83091A Other mechanical complication of indwelling urethral catheter, initial encounter: Secondary | ICD-10-CM | POA: Insufficient documentation

## 2021-07-07 DIAGNOSIS — Y69 Unspecified misadventure during surgical and medical care: Secondary | ICD-10-CM | POA: Diagnosis not present

## 2021-07-07 LAB — URINALYSIS, ROUTINE W REFLEX MICROSCOPIC
Bilirubin Urine: NEGATIVE
Glucose, UA: NEGATIVE mg/dL
Ketones, ur: NEGATIVE mg/dL
Nitrite: NEGATIVE
Protein, ur: 30 mg/dL — AB
RBC / HPF: >50 RBC/hpf — ABNORMAL HIGH (ref 0–5)
Specific Gravity, Urine: 1.01 (ref 1.005–1.030)
pH: 5.5 (ref 5.0–8.0)

## 2021-07-07 NOTE — ED Triage Notes (Signed)
Pt reports blood in urinary catheter bag over the last 2 days. States drainage has slowed since the blood in the catheter.  ? ?About an hour ago the bag stopped draining . Pt report that as he was standing in line to register that his catheter started draining again. Pt states "I have a full bag." ?

## 2021-07-08 NOTE — ED Provider Notes (Signed)
? ?[facility]  ?Provider Note ? ?CSN: [id number] ?Arrival date & time: 07/07/21 2133 ? ?History ?Chief Complaint  ?Patient presents with  ? Urinary Retention  ? ? ?[name] is a 71 y.o. male reports he had a prostate procedure done about a month ago and has had continued issues with voiding since then requiring an indwelling catheter. He was in the Urology office yesterday but unable to void after catheter was removed and had to go back to the office where they initially tried several in-and-out caths in an attempt to begin home self-caths but were unable to pass the catheter and eventually re-inserted a foley. He has has bloody urine since then but noted his bag was not draining and he felt like his bladder was full this evening prompting him to come to the ED. While in registration, he felt his bladder empty in the bag and was feeling better. Currently asymptomatic. Not currently on a blood thinner.  ? ? ?Home Medications ?Prior to Admission medications   ?Medication Sig Start Date End Date Taking? Authorizing Provider  ?acetaminophen (TYLENOL) 500 MG tablet Take 1,000 mg by mouth every 6 (six) hours as needed for mild pain.    [provider]  ?allopurinol (ZYLOPRIM) 300 MG tablet Take 1 tablet (300 mg total) by mouth daily. 06/19/19   Nyoka Cowden, MD  ?aspirin EC 81 MG tablet Take 1 tablet (81 mg total) by mouth daily. 05/15/16   Nyoka Cowden, MD  ?cholecalciferol (VITAMIN D3) 25 MCG (1000 UNIT) tablet Take 1,000 Units by mouth daily.    [provider]  ?finasteride (PROSCAR) 5 MG tablet Take 5 mg by mouth daily.      [provider]  ?furosemide (LASIX) 20 MG tablet TAKE 1 TABLET DAILY 03/08/20   Nyoka Cowden, MD  ?guanFACINE (TENEX) 1 MG tablet TAKE ONE AND ONE-HALF TABLETS AT BEDTIME 07/13/19   Nyoka Cowden, MD  ?ibuprofen (ADVIL) 200 MG tablet Take 600-800 mg by mouth every 6 (six) hours as needed for moderate pain.    [provider]  ?metoprolol succinate (TOPROL-XL) 25 MG 24 hr tablet Take 25 mg by mouth daily.    [provider]  ?sildenafil (REVATIO) 20 MG tablet Take 20 mg by mouth daily as needed.  11/24/18   [provider]  ?tamsulosin (FLOMAX) 0.4 MG CAPS capsule Take 0.4 mg by mouth 2 (two) times daily.    [provider]  ?telmisartan (MICARDIS) 80 MG tablet Take 80 mg by mouth daily. 11/02/19   [provider]  ? ? ? ?Allergies    ?Amoxicillin ? ? ?Review of Systems   ?Review of Systems ?Please see HPI for pertinent positives and negatives ? ?Physical Exam ?BP (!) 155/96   Pulse 77   Temp 98.2 ?F (36.8 ?C)   Resp 18   Ht 6\' 3"  (1.905 m)   Wt 88.5 kg   SpO2 98%   BMI 24.37 kg/m?  ? ?Physical Exam ?Vitals and nursing note reviewed.  ?Constitutional:   ?   Appearance: Normal appearance.  ?HENT:  ?   Head: Normocephalic and atraumatic.  ?   Nose: Nose normal.  ?   Mouth/Throat:  ?   Mouth: Mucous membranes are moist.  ?Eyes:  ?   Extraocular Movements: Extraocular movements intact.  ?   Conjunctiva/sclera: Conjunctivae normal.  ?Cardiovascular:  ?   Rate and Rhythm: Normal rate.  ?Pulmonary:  ?   Effort:  Pulmonary effort is normal.  ?   Breath sounds: Normal breath sounds.  ?Abdominal:  ?   General: Abdomen is flat.  ?   Palpations: Abdomen is soft.  ?   Tenderness: There is no abdominal tenderness.  ?Genitourinary: ?   Comments: Foley catheter in place, difficult to see the size as the writing on the side of the hub has worn off, but he thinks it is an 20fr. There is bloody urine in the bag, but no clots.  ?Musculoskeletal:     ?   General: No swelling. Normal range of motion.  ?   Cervical back: Neck supple.  ?Skin: ?   General: Skin is warm and dry.  ?Neurological:  ?   General: No focal deficit present.  ?   Mental Status: He is alert.  ?Psychiatric:     ?   Mood and Affect: Mood normal.  ? ? ?ED Results / Procedures / Treatments    ?EKG ?None ? ?Procedures ?Procedures ? ?Medications Ordered in the ED ?Medications - No data to display ? ?Initial Impression and Plan ? Patient here with indwelling foley, just changed yesterday after multiple attempts now with hematuria and clogged catheter which has self-resolved prior to my evaluation. Will ask RN to flush catheter to ensure no further clogs. UA with hematuria but no signs of infection. If catheter functioning normally, plan discharge with instructions on how to flush at home if necessary and Urology follow up at next week.  ? ?ED Course  ? ?Clinical Course as of 07/08/21 0039  ?Sat Jul 08, 2021  ?0037 Foley flushes easily. Some clots have irrigated out. Patient is comfortable flushing at home. Advised to RTED if he is unable to relieve any future obstructions. Follow up with Urology as scheduled next week.  [CS]  ?  ?Clinical Course User Index ?[CS] Pollyann Savoy, MD  ? ? ? ?MDM Rules/Calculators/A&P ?Medical Decision Making ?Problems Addressed: ?Foley catheter problem, initial encounter [facility]): acute illness or injury ?Hematuria, unspecified type: acute illness or injury ? ?Amount and/or Complexity of Data Reviewed ?Labs: ordered. Decision-making details documented in ED Course. ? ? ? ?Final Clinical Impression(s) / ED Diagnoses ?Final diagnoses:  ?Foley catheter problem, initial encounter (HCC)  ?Hematuria, unspecified type  ? ? ?Rx / DC Orders ?ED Discharge Orders   ? ? None  ? ?  ? ?  ?Pollyann Savoy, MD ?07/08/21 [phone] ? ?

## 2021-07-08 NOTE — ED Notes (Signed)
Bladder irrigated per MD request  ?

## 2021-07-12 DIAGNOSIS — N401 Enlarged prostate with lower urinary tract symptoms: Secondary | ICD-10-CM | POA: Insufficient documentation

## 2022-04-30 ENCOUNTER — Encounter: Payer: Self-pay | Admitting: Podiatry

## 2022-04-30 ENCOUNTER — Ambulatory Visit (INDEPENDENT_AMBULATORY_CARE_PROVIDER_SITE_OTHER): Payer: Medicare Other

## 2022-04-30 ENCOUNTER — Ambulatory Visit (INDEPENDENT_AMBULATORY_CARE_PROVIDER_SITE_OTHER): Payer: Medicare Other | Admitting: Podiatry

## 2022-04-30 DIAGNOSIS — L600 Ingrowing nail: Secondary | ICD-10-CM

## 2022-04-30 DIAGNOSIS — M79671 Pain in right foot: Secondary | ICD-10-CM

## 2022-04-30 NOTE — Patient Instructions (Signed)

## 2022-05-01 ENCOUNTER — Other Ambulatory Visit: Payer: Self-pay | Admitting: Podiatry

## 2022-05-01 DIAGNOSIS — L600 Ingrowing nail: Secondary | ICD-10-CM

## 2022-05-01 DIAGNOSIS — M79671 Pain in right foot: Secondary | ICD-10-CM

## 2022-05-01 NOTE — Progress Notes (Signed)
Subjective:   Patient ID: [name], male   DOB: 72 y.o.   MRN: [id number]   HPI Patient presents stating he is developed a lot of pain in the right second digit and that it is worse when he gets on his foot and that it seems like it is involving the toe and the nail itself   ROS      Objective:  Physical Exam  Neurovascular status intact muscle strength found to be adequate range of motion adequate with a significant thickness and incurvation of the right second nail with redness in the distal tip redness is in the proximal nail fold and pain with any palpation to the bed itself and to the distal toe     Assessment:  Probability that this is a nail disease but also may have some pathology of the distal digit with digital deformity     Plan:  H&P reviewed and at this point I do recommend removal of the nail and seeing if this solves all problems.  I explained procedure risk patient wants surgery and at this point I allowed him to read then signed consent form.  I infiltrated the right second toe 60 mg like Marcaine mixture sterile prep done using sterile instrumentation remove the nail exposed matrix applied phenol 3 applications 30 seconds followed by alcohol lavage sterile dressing gave instructions on soaks leave dressing on 24 hours take it off earlier if throbbing were to occur and may need to have the digit addressed if symptoms persist  X-rays were negative for signs of bone changes appears to be more soft tissue with mild elevation and elongation second digit

## 2022-05-02 ENCOUNTER — Ambulatory Visit: Payer: Self-pay | Admitting: Podiatry

## 2022-05-24 ENCOUNTER — Ambulatory Visit (INDEPENDENT_AMBULATORY_CARE_PROVIDER_SITE_OTHER): Payer: Medicare Other | Admitting: Podiatry

## 2022-05-24 ENCOUNTER — Encounter: Payer: Self-pay | Admitting: Podiatry

## 2022-05-24 DIAGNOSIS — L600 Ingrowing nail: Secondary | ICD-10-CM

## 2022-05-24 NOTE — Progress Notes (Signed)
Subjective: [name] is a 72 y.o.  male returns to office today for follow up evaluation after having right 2nd total nail avulsion performed. Patient has been soaking using epsom salt and applying topical antibiotic covered with bandaid daily. Patient denies fevers, chills, nausea, vomiting. Denies any calf pain, chest pain, SOB.   Objective:  Vitals: Reviewed  General: Well developed, nourished, in no acute distress, alert and oriented x3   Dermatology: Skin is warm, dry and supple bilateral. right 2nd nail bed appears to be clean, dry, with mild granular tissue and surrounding scab. There is no surrounding erythema, edema, drainage/purulence. The remaining nails appear unremarkable at this time. There are no other lesions or other signs of infection present.  Neurovascular status: Intact. No lower extremity swelling; No pain with calf compression bilateral.  Musculoskeletal: Decreased tenderness to palpation of the right 2nd nail bed. Muscular strength within normal limits bilateral.   Assesement and Plan: S/p total nail avulsion, doing well.   -Disontinue soaking in epsom salts twice a day followed by antibiotic ointment and a band-aid. Can leave uncovered at night. Continue this until completely healed.  -If the area has not healed in 2 weeks, call the office for follow-up appointment, or sooner if any problems arise.  -Monitor for any signs/symptoms of infection. Call the office immediately if any occur or go directly to the emergency room. Call with any questions/concerns.  Boneta Lucks, DPM

## 2022-07-05 ENCOUNTER — Encounter: Payer: Self-pay | Admitting: Internal Medicine

## 2022-07-05 ENCOUNTER — Ambulatory Visit: Payer: Medicare Other | Attending: Internal Medicine | Admitting: Internal Medicine

## 2022-07-05 VITALS — BP 118/72 | HR 80 | Ht 75.0 in | Wt 200.4 lb

## 2022-07-05 DIAGNOSIS — I4891 Unspecified atrial fibrillation: Secondary | ICD-10-CM

## 2022-07-05 DIAGNOSIS — I493 Ventricular premature depolarization: Secondary | ICD-10-CM

## 2022-07-05 DIAGNOSIS — R55 Syncope and collapse: Secondary | ICD-10-CM | POA: Diagnosis present

## 2022-07-05 NOTE — Patient Instructions (Addendum)
Medication Instructions:  Your physician recommends that you continue on your current medications as directed. Please refer to the Current Medication list given to you today.  *If you need a refill on your cardiac medications before your next appointment, please call your pharmacy*  Lab Work: None ordered.  If you have labs (blood work) drawn today and your tests are completely normal, you will receive your results only by: MyChart Message (if you have MyChart) OR A paper copy in the mail If you have any lab test that is abnormal or we need to change your treatment, we will call you to review the results.  Testing/Procedures: None ordered.  Follow-Up: At [facility], you and your health needs are our priority.  As part of our continuing mission to provide you with exceptional heart care, we have created designated Provider Care Teams.  These Care Teams include your primary Cardiologist (physician) and Advanced Practice Providers (APPs -  Physician Assistants and Nurse Practitioners) who all work together to provide you with the care you need, when you need it.  We recommend signing up for the patient portal called "MyChart".  Sign up information is provided on this After Visit Summary.  MyChart is used to connect with patients for Virtual Visits (Telemedicine).  Patients are able to view lab/test results, encounter notes, upcoming appointments, etc.  Non-urgent messages can be sent to your provider as well.   To learn more about what you can do with MyChart, go to [email or web address].    Your next appointment:   1 year(s) 08/01/2023  The format for your next appointment:   In Person  Provider:   Lewayne Bunting, MD{or one of the following Advanced Practice Providers on your designated Care Team:   Francis Dowse, [place] Casimiro Needle "[facility]" [place], [place]

## 2022-07-05 NOTE — Progress Notes (Signed)
HPI Mr. Custalow returns today for followup of syncope. He is a pleasant 72 yo man with a h/o syncope. He has not passed out in several years. He denies chest pain or sob and has no limit in his activity.  He has not had syncope. He has lost weight and is more active now that he has partially retired.  Allergies  Allergen Reactions   Amoxicillin Rash     Current Outpatient Medications  Medication Sig Dispense Refill   acetaminophen (TYLENOL) 500 MG tablet Take 1,000 mg by mouth every 6 (six) hours as needed for mild pain.     allopurinol (ZYLOPRIM) 300 MG tablet Take 1 tablet (300 mg total) by mouth daily. 90 tablet 3   amLODipine (NORVASC) 5 MG tablet      aspirin EC 81 MG tablet Take 1 tablet (81 mg total) by mouth daily.     cholecalciferol (VITAMIN D3) 25 MCG (1000 UNIT) tablet Take 1,000 Units by mouth daily.     fluorouracil (EFUDEX) 5 % cream Apply 1 Application topically 2 (two) times daily.     guanFACINE (TENEX) 1 MG tablet TAKE ONE AND ONE-HALF TABLETS AT BEDTIME 135 tablet 3   ibuprofen (ADVIL) 200 MG tablet Take 600-800 mg by mouth every 6 (six) hours as needed for moderate pain.     metoprolol succinate (TOPROL-XL) 25 MG 24 hr tablet 25 mg every morning.     oxybutynin (DITROPAN) 5 MG tablet Take by mouth.     sildenafil (REVATIO) 20 MG tablet Take 20 mg by mouth daily as needed.      spironolactone (ALDACTONE) 25 MG tablet Take by mouth.     telmisartan (MICARDIS) 80 MG tablet Take 80 mg by mouth daily.     No current facility-administered medications for this visit.     Past Medical History:  Diagnosis Date   Adult attention deficit disorder    Benign prostatic hypertrophy    Bradycardia    Diverticulitis of colon    colonoscopy 07/11/2000   HBP (high blood pressure)    change flomax to cardura for bp in December 2011   Nephrolithiasis    prostatitis with noncaseating granulomatous inflammation July 2000 negative IPPD.     Syncope     ROS:   All  systems reviewed and negative except as noted in the HPI.   Past Surgical History:  Procedure Laterality Date   CYSTOSTOMY W/ BLADDER BIOPSY     from trigone   TONSILLECTOMY  1958   US ECHOCARDIOGRAPHY  07/06/08     Family History  Problem Relation Age of Onset   Heart disease Mother 19       IHD with CABG   Kidney failure Father    Hypertension Father    Ehlers-Danlos syndrome Child    Colon cancer Neg Hx      Social History   Socioeconomic History   Marital status: Married    Spouse name: Not on file   Number of children: Not on file   Years of education: Not on file   Highest education level: Not on file  Occupational History   Occupation: Lawyer    Employer: [employer]  Tobacco Use   Smoking status: Never   Smokeless tobacco: Never  Vaping Use   Vaping Use: Never used  Substance and Sexual Activity   Alcohol use: Yes    Alcohol/week: 3.0 standard drinks of alcohol    Types: 3 Glasses of wine  per week   Drug use: No   Sexual activity: Not on file  Other Topics Concern   Not on file  Social History Narrative   Not on file   Social Determinants of Health   Financial Resource Strain: Not on file  Food Insecurity: Not on file  Transportation Needs: Not on file  Physical Activity: Not on file  Stress: Not on file  Social Connections: Not on file  Intimate Partner Violence: Not on file     BP 118/72   Pulse 80   Ht 6\' 3"  (1.905 m)   Wt 200 lb 6.4 oz (90.9 kg)   SpO2 98%   BMI 25.05 kg/m   Physical Exam:  Well appearing NAD HEENT: Unremarkable Neck:  No JVD, no thyromegally Lymphatics:  No adenopathy Back:  No CVA tenderness Lungs:  Clear HEART:  Regular rate rhythm, no murmurs, no rubs, no clicks Abd:  soft, positive bowel sounds, no organomegally, no rebound, no guarding Ext:  2 plus pulses, no edema, no cyanosis, no clubbing Skin:  No rashes no nodules Neuro:  CN II through XII intact, motor grossly intact  EKG  DEVICE   Normal device function.  See PaceArt for details.   Assess/Plan: 1. Syncope - he has not had any recurrent episodes. He will undergo watchful waiting. I suspect that the etiology of his syncope is due to autonomic dysfunction. 2. Palpitations - he has minimal symptoms. He has purchased an [employer] watch but has not documented any arrhythmias 3. HTN - his bp is controlled today and he will continue his current meds. 4. PVC's - his symptoms are well controlled. No change in his meds.   Sharlot Gowda [place],MD

## 2022-07-30 ENCOUNTER — Ambulatory Visit: Payer: Medicare Other | Admitting: Internal Medicine

## 2022-08-09 ENCOUNTER — Ambulatory Visit (INDEPENDENT_AMBULATORY_CARE_PROVIDER_SITE_OTHER): Payer: Medicare Other | Admitting: Podiatry

## 2022-08-09 ENCOUNTER — Encounter: Payer: Self-pay | Admitting: Podiatry

## 2022-08-09 DIAGNOSIS — L089 Local infection of the skin and subcutaneous tissue, unspecified: Secondary | ICD-10-CM

## 2022-08-09 DIAGNOSIS — S90821A Blister (nonthermal), right foot, initial encounter: Secondary | ICD-10-CM | POA: Diagnosis not present

## 2022-08-09 DIAGNOSIS — M79671 Pain in right foot: Secondary | ICD-10-CM

## 2022-08-10 NOTE — Progress Notes (Signed)
Subjective:   Patient ID: [name], male   DOB: 72 y.o.   MRN: [id number]   HPI Patient presents concerned about a small blister on the right dorsum of the second toe and thickness of the end of the second toe where the nail was removed several months ago   ROS      Objective:  Physical Exam  Neurovascular status intact with patient found to have a small area of breakdown on the dorsal fifth digit right around the head of the proximal phalanx.  It is localized there is no erythema edema or drainage associated with it and has some scab tissue on top of the nail where the nailbed had been removed about 3 months ago     Assessment:  Blister which just appears to be due to contusion possibility that it may be fungal with scar tissue on top of the nailbed     Plan:  Advised this could be fungal as it comes several times and organ to go ahead and start him on Lamisil cream and open toed shoes for a few days and hopefully it resolves.  I debrided the tissue on top of the toenail which made it feel better and he will be seen back as needed

## 2022-10-08 ENCOUNTER — Ambulatory Visit: Payer: Medicare Other | Admitting: Podiatry

## 2022-10-10 ENCOUNTER — Ambulatory Visit (INDEPENDENT_AMBULATORY_CARE_PROVIDER_SITE_OTHER): Payer: Medicare Other | Admitting: Podiatry

## 2022-10-10 ENCOUNTER — Encounter: Payer: Self-pay | Admitting: Podiatry

## 2022-10-10 DIAGNOSIS — M2041 Other hammer toe(s) (acquired), right foot: Secondary | ICD-10-CM

## 2022-10-10 DIAGNOSIS — L84 Corns and callosities: Secondary | ICD-10-CM | POA: Diagnosis not present

## 2022-10-10 NOTE — Progress Notes (Signed)
Subjective:   Patient ID: [name], male   DOB: 72 y.o.   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  He wants to get this done understands surgery at this point we discussed his timing and will most likely do in the fall as he is getting ready to move.  All questions answered

## 2022-10-10 NOTE — Progress Notes (Signed)
Still having discomfort in 2nd toe right foot

## 2023-04-03 ENCOUNTER — Other Ambulatory Visit: Payer: Self-pay | Admitting: Internal Medicine

## 2023-04-03 DIAGNOSIS — R1310 Dysphagia, unspecified: Secondary | ICD-10-CM

## 2023-04-04 ENCOUNTER — Ambulatory Visit
Admission: RE | Admit: 2023-04-04 | Discharge: 2023-04-04 | Disposition: A | Discharge status: Home / Self Care | Payer: Medicare Other | Source: Ambulatory Visit | Attending: Internal Medicine | Admitting: Internal Medicine

## 2023-04-04 DIAGNOSIS — R1310 Dysphagia, unspecified: Secondary | ICD-10-CM

## 2023-05-13 DIAGNOSIS — K219 Gastro-esophageal reflux disease without esophagitis: Secondary | ICD-10-CM | POA: Insufficient documentation

## 2023-05-16 ENCOUNTER — Encounter: Payer: Self-pay | Admitting: Physician Assistant

## 2023-05-16 ENCOUNTER — Ambulatory Visit: Payer: Medicare Other | Admitting: Physician Assistant

## 2023-05-16 ENCOUNTER — Telehealth: Payer: Self-pay

## 2023-05-16 VITALS — BP 120/80 | HR 83 | Ht 74.5 in | Wt 199.2 lb

## 2023-05-16 DIAGNOSIS — E041 Nontoxic single thyroid nodule: Secondary | ICD-10-CM

## 2023-05-16 DIAGNOSIS — K5790 Diverticulosis of intestine, part unspecified, without perforation or abscess without bleeding: Secondary | ICD-10-CM

## 2023-05-16 DIAGNOSIS — R1310 Dysphagia, unspecified: Secondary | ICD-10-CM

## 2023-05-16 DIAGNOSIS — E049 Nontoxic goiter, unspecified: Secondary | ICD-10-CM

## 2023-05-16 DIAGNOSIS — Z1211 Encounter for screening for malignant neoplasm of colon: Secondary | ICD-10-CM

## 2023-05-16 DIAGNOSIS — I4891 Unspecified atrial fibrillation: Secondary | ICD-10-CM

## 2023-05-16 DIAGNOSIS — R1319 Other dysphagia: Secondary | ICD-10-CM

## 2023-05-16 DIAGNOSIS — K219 Gastro-esophageal reflux disease without esophagitis: Secondary | ICD-10-CM

## 2023-05-16 MED ORDER — PANTOPRAZOLE SODIUM 20 MG PO TBEC: 20.0000 mg | DELAYED_RELEASE_TABLET | Freq: Every day | ORAL | 3 refills | Status: AC

## 2023-05-16 MED ORDER — SUFLAVE 178.7 G PO SOLR: 1.0000 | Freq: Once | ORAL | 0 refills | Status: AC

## 2023-05-16 NOTE — Telephone Encounter (Signed)
Spoke with patient to advise Thyroid US at [facility] for Monday 05/20/23 @ 11:30 am, PT verbally understood.

## 2023-05-16 NOTE — Progress Notes (Signed)
 Agree with assessment and plan as outlined.

## 2023-05-16 NOTE — Patient Instructions (Addendum)
Silent reflux: Not all heartburn burns...Marland KitchenMarland KitchenMarland Kitchen  What is LPR? Laryngopharyngeal reflux (LPR) or silent reflux is a condition in which acid that is made in the stomach travels up the esophagus (swallowing tube) and gets to the throat. Not everyone with reflux has a lot of heartburn or indigestion. In fact, many people with LPR never have heartburn. This is why LPR is called SILENT REFLUX, and the terms "Silent reflux" and "LPR" are often used interchangeably. Because LPR is silent, it is sometimes difficult to diagnose.  How can you tell if you have LPR?  Chronic hoarseness- Some people have hoarseness that comes and goes throat clearing  Cough It can cause shortness of breath and cause asthma like symptoms. a feeling of a lump in the throat  difficulty swallowing a problem with too much nose and throat drainage.  Some people will feel their esophagus spasm which feels like their heart beating hard and fast, this will usually be after a meal, at rest, or lying down at night.    How do I treat this? Treatment for LPR should be individualized, and your doctor will suggest the best treatment for you. Generally there are several treatments for LPR: changing habits and diet to reduce reflux,  medications to reduce stomach acid, and  surgery to prevent reflux. Most people with LPR need to modify how and when they eat, as well as take some medication, to get well. Sometimes, nonprescription liquid antacids, such as Maalox, Gelucil and Mylanta are recommended. When used, these antacids should be taken four times each day - one tablespoon one hour after each meal and before bedtime. Dietary and lifestyle changes alone are not often enough to control LPR - medications that reduce stomach acid are also usually needed. These must be prescribed by our doctor.   TIPS FOR REDUCING REFLUX AND LPR Control your LIFE-STYLE and your DIET! If you use tobacco, QUIT.  Smoking makes you reflux. After every  cigarette you have some LPR.  Don't wear clothing that is too tight, especially around the waist (trousers, corsets, belts).  Do not lie down just after eating...in fact, do not eat within three hours of bedtime.  You should be on a low-fat diet.  Limit your intake of red meat.  Limit your intake of butter.  Avoid fried foods.  Avoid chocolate  Avoid cheese.  Avoid eggs. Specifically avoid caffeine (especially coffee and tea), soda pop (especially cola) and mints.  Avoid alcoholic beverages, particularly in the evening.  Diverticulosis Diverticulosis is a condition that develops when small pouches (diverticula) form in the wall of the large intestine (colon). The colon is where water is absorbed and stool (feces) is formed. The pouches form when the inside layer of the colon pushes through weak spots in the outer layers of the colon. You may have a few pouches or many of them. The pouches usually do not cause problems unless they become inflamed or infected. When this happens, the condition is called diverticulitis- this is left lower quadrant pain, diarrhea, fever, chills, nausea or vomiting.  If this occurs please call the office or go to the hospital. Sometimes these patches without inflammation can also have painless bleeding associated with them, if this happens please call the office or go to the hospital. Preventing constipation and increasing fiber can help reduce diverticula and prevent complications. Even if you feel you have a high-fiber diet, suggest getting on Benefiber or Cirtracel 2 times daily.  You have been scheduled for an endoscopy  and colonoscopy. Please follow the written instructions given to you at your visit today.  If you use inhalers (even only as needed), please bring them with you on the day of your procedure.  DO NOT TAKE 7 DAYS PRIOR TO TEST- Trulicity (dulaglutide) Ozempic, Wegovy (semaglutide) Mounjaro (tirzepatide) Bydureon Bcise (exanatide extended  release)  DO NOT TAKE 1 DAY PRIOR TO YOUR TEST Rybelsus (semaglutide) Adlyxin (lixisenatide) Victoza (liraglutide) Byetta (exanatide) ___________________________________________________________________________  Bonita Quin will receive your bowel preparation through [facility], which ensures the lowest copay and home delivery, with outreach via text or call from an [phone] number. Please respond promptly to avoid rescheduling of your procedure. If you are interested in alternative options or have any questions regarding your prep, please contact them at [phone] ____________________________________________________________________________  Your Provider Has Sent Your Bowel Prep Regimen To [facility]   [facility] will contact you to verify your information and collect your copay, if applicable. Enjoy the comfort of your home while your prescription is mailed to you, FREE of any shipping charges.   [facility] accepts all major insurance benefits and applies discounts & coupons.  Have additional questions?   Chat: [email or web address] Call: [phone] Email: [email or web address] NCPDP: [id number]  How will [facility] contact you?  With a Welcome phone call,  a Welcome text and a checkout link in text form.  Texts you receive from [phone] Are NOT Spam.  *To set up delivery, you must complete the checkout process via link or speak to one of the patient care representatives. If [facility] is unable to reach you, your prescription may be delayed.  To avoid long hold times on the phone, you may also utilize the secure chat feature on the [facility] website to request that they call you back for transaction completion or to expedite your concerns.  Due to recent changes in healthcare laws, you may see the results of your imaging and laboratory studies on MyChart before your provider has had a chance to review them.  We understand that in some cases there may be results that are  confusing or concerning to you. Not all laboratory results come back in the same time frame and the provider may be waiting for multiple results in order to interpret others.  Please give [place] 48 hours in order for your provider to thoroughly review all the results before contacting the office for clarification of your results.    We have sent the following medications to your pharmacy for you to pick up at your convenience:  Pantoprazole  _______________________________________________________  If your blood pressure at your visit was 140/90 or greater, please contact your primary care physician to follow up on this.  _______________________________________________________  If you are age 27 or older, your body mass index should be between 23-30. Your Body mass index is 25.24 kg/m. If this is out of the aforementioned range listed, please consider follow up with your Primary Care Provider.  If you are age 69 or younger, your body mass index should be between 19-25. Your Body mass index is 25.24 kg/m. If this is out of the aformentioned range listed, please consider follow up with your Primary Care Provider.   ________________________________________________________  The [facility] providers would like to encourage you to use [facility] to communicate with providers for non-urgent requests or questions.  Due to long hold times on the telephone, sending your provider a message by [facility] may be a faster and more efficient way to get a response.  Please allow 48 business hours for a  response.  Please remember that this is for non-urgent requests.  _______________________________________________________ Thank you for trusting me with your gastrointestinal care!   Quentin Mulling, PA

## 2023-05-16 NOTE — Progress Notes (Signed)
05/16/2023 [name] [id number] Feb 16, 1951  Referring provider: Alysia Penna, MD Primary GI doctor:Dr. Armbruster ( Dr. Juanda Chance)   ASSESSMENT AND PLAN:  Dysphagia x 6 months, likely LPR Intermittent, mainly solids Barium swallow showed GERD, small hiatal hernia and dysmotility Schedule for thyroid US, has possible thyroid enlargement on the left Most suspicious for LPR but since patient is due for colonoscopy will also schedule for endoscopy with possible dilation to evaluate for esophagitis, stricture, Barrett's esophagus, H. Pylori. I discussed risks of EGD with patient today, including risk of sedation, bleeding or perforation.  Patient provides understanding and gave verbal consent to proceed. In the interim patient advised about swallowing precautions.  Eat slowly, chew food well before swallowing.  Drink liquids in between each bite to avoid food impaction. Start low-dose pantoprazole 20 mg  Hiatal hernia Potentially contributing to LPR  Screen colonoscopy Last colonoscopy 2014 overdue for recall no changes in bowel habits  Diverticulosis Will call if any symptoms. Add on fiber supplement, avoid NSAIDS, information given  Left thyroid goiter/mass Abnormal left thyroid mass possible lipoma, with symptoms of dysphagia we will plan on thyroid ultrasound to evaluate further. No weight loss, last thyroid was 0.4 to in 2020. Consider repeat thyroid   Patient Care Team: Alysia Penna, MD as PCP - General (Internal Medicine)  HISTORY OF PRESENT ILLNESS: 73 y.o. male with a past medical history of atrial fibrillation on baby aspirin, hypertension, GERD, diverticulosis, CKD stage I, essential tremor and others listed below presents for evaluation of dysphagia.   Patient previously known to Dr. Juanda Chance, with colonoscopy 02/2013 with diverticulosis descending colon recall 10 years.  Discussed the use of AI scribe software for clinical note transcription with  the patient, who gave verbal consent to proceed.  History of Present Illness   The patient, with a history of kidney problems, presents with intermittent dysphagia for solids for the past six months. He describes the sensation as food getting 'stuck at the top of my esophagus.' He denies associated pain, choking, vomiting, and regurgitation. He also denies dysphagia for liquids. He has noticed some changes in his voice and occasional throat clearing. He reports frequent belching and gas, but denies typical symptoms of gastroesophageal reflux disease such as heartburn or chest pain. He also denies nausea, vomiting, weight loss, and changes in bowel habits. He has a history of diverticulosis and is due for a colonoscopy. He also reports a recent episode of shingles.      He  reports that he has never smoked. He has never used smokeless tobacco. He reports current alcohol use of about 3.0 standard drinks of alcohol per week. He reports that he does not use drugs.  RELEVANT GI HISTORY, LABS, IMAGING: 04/04/2023 barium swallow for dysphagia MPRESSION: Small sliding hiatal hernia. Mild gastroesophageal reflux. No evidence of esophageal mass or stricture. Mild esophageal dysmotility.  11/27/2019 MRI angio AB with and without rule out RAS IMPRESSION: 1. No evidence of renal artery stenosis. 2. No adrenal lesion. 3. No acute findings are noted in the abdomen. 4. Multiple Bosniak class 1 and Bosniak class 2 cysts in the kidneys bilaterally.  03/06/2013 Colonoscopy ENDOSCOPIC IMPRESSION: Mild diverticulosis was noted in the descending colon  CBC    Component Value Date/Time   WBC 8.1 10/15/2019 0200   RBC 5.14 10/15/2019 0200   HGB 15.0 10/15/2019 0225   HCT 44.0 10/15/2019 0225   PLT 259 10/15/2019 0200   MCV 91.2 10/15/2019 0200   MCH 29.6  10/15/2019 0200   MCHC 32.4 10/15/2019 0200   RDW 14.7 10/15/2019 0200   LYMPHSABS 1.3 10/15/2019 0200   MONOABS 1.0 10/15/2019 0200   EOSABS 0.3  10/15/2019 0200   BASOSABS 0.1 10/15/2019 0200   No results for input(s): "HGB" in the last 8760 hours.  CMP     Component Value Date/Time   NA 143 10/15/2019 0225   K 3.2 (L) 10/15/2019 0225   CL 101 10/15/2019 0225   CO2 31 05/12/2019 1024   GLUCOSE 98 10/15/2019 0225   BUN 22 10/15/2019 0225   CREATININE 1.30 (H) 10/15/2019 0225   CALCIUM 9.6 05/12/2019 1024   PROT 6.6 05/12/2019 1024   ALBUMIN 3.9 05/12/2019 1024   AST 13 05/12/2019 1024   ALT 12 05/12/2019 1024   ALKPHOS 77 05/12/2019 1024   BILITOT 0.8 05/12/2019 1024   GFRNONAA 57 (L) 03/03/2010 0445   GFRAA  03/03/2010 0445    >60        The eGFR has been calculated using the MDRD equation. This calculation has not been validated in all clinical situations. eGFR's persistently <60 mL/min signify possible Chronic Kidney Disease.      Latest Ref Rng & Units 05/12/2019   10:24 AM 03/12/2019   10:21 AM 02/09/2019   12:02 PM  Hepatic Function  Total Protein 6.0 - 8.3 g/dL 6.6  6.6  6.8   Albumin 3.5 - 5.2 g/dL 3.9  4.0  4.2   AST 0 - 37 U/L 13  12  18    ALT 0 - 53 U/L 12  13  18    Alk Phosphatase 39 - 117 U/L 77  70  73   Total Bilirubin 0.2 - 1.2 mg/dL 0.8  0.7  0.7   Bilirubin, Direct 0.0 - 0.3 mg/dL 0.2  0.2  0.2       Current Medications:    Current Outpatient Medications (Cardiovascular):    amLODipine (NORVASC) 5 MG tablet,    guanFACINE (TENEX) 1 MG tablet, TAKE ONE AND ONE-HALF TABLETS AT BEDTIME   metoprolol succinate (TOPROL-XL) 25 MG 24 hr tablet, 25 mg every morning.   sildenafil (REVATIO) 20 MG tablet, Take 20 mg by mouth daily as needed.    spironolactone (ALDACTONE) 25 MG tablet, Take by mouth.   telmisartan (MICARDIS) 80 MG tablet, Take 80 mg by mouth daily.   Current Outpatient Medications (Analgesics):    acetaminophen (TYLENOL) 500 MG tablet, Take 1,000 mg by mouth every 6 (six) hours as needed for mild pain.   allopurinol (ZYLOPRIM) 300 MG tablet, Take 1 tablet (300 mg total) by  mouth daily.   aspirin EC 81 MG tablet, Take 1 tablet (81 mg total) by mouth daily.   ibuprofen (ADVIL) 200 MG tablet, Take 600-800 mg by mouth every 6 (six) hours as needed for moderate pain. (Patient not taking: Reported on 05/16/2023)   Current Outpatient Medications (Other):    cholecalciferol (VITAMIN D3) 25 MCG (1000 UNIT) tablet, Take 1,000 Units by mouth daily.   escitalopram (LEXAPRO) 10 MG tablet, Take 10 mg by mouth daily.   fluorouracil (EFUDEX) 5 % cream, Apply 1 Application topically 2 (two) times daily.   pantoprazole (PROTONIX) 20 MG tablet, Take 1 tablet (20 mg total) by mouth daily before breakfast.  Medical History:  Past Medical History:  Diagnosis Date   Adult attention deficit disorder    Benign prostatic hypertrophy    Bradycardia    Diverticulitis of colon    colonoscopy 07/11/2000  HBP (high blood pressure)    change flomax to cardura for bp in December 2011   Nephrolithiasis    prostatitis with noncaseating granulomatous inflammation July 2000 negative IPPD.     Syncope    Allergies:  Allergies  Allergen Reactions   Amoxicillin Rash     Surgical History:  He  has a past surgical history that includes Cystostomy w/ bladder biopsy; US ECHOCARDIOGRAPHY (07/06/2008); Tonsillectomy (03/26/1956); and Transurethral resection of prostate. Family History:  His family history includes Ehlers-Danlos syndrome in his child; Heart disease (age of onset: 63) in his mother; Hypertension in his father; Kidney failure in his father; Lung cancer in his sister.  REVIEW OF SYSTEMS  : All other systems reviewed and negative except where noted in the History of Present Illness.  PHYSICAL EXAM: BP 120/80   Pulse 83   Ht 6' 2.5" (1.892 m)   Wt 199 lb 4 oz (90.4 kg)   BMI 25.24 kg/m  General Appearance: Well nourished, in no apparent distress. Head:   Normocephalic and atraumatic.  NECK: Left thyroid enlarged compared to right, moved with swallowing, possible lipoma with  consistency but more concern for possible thyroid mass.  Eyes:  sclerae anicteric,conjunctive pink  Respiratory: Respiratory effort normal, BS equal bilaterally without rales, rhonchi, wheezing. Cardio: RRR with no MRGs. Peripheral pulses intact.  Abdomen: Soft,  Non-distended ,active bowel sounds. No tenderness . No masses. Rectal: Not evaluated Musculoskeletal: Full ROM, Normal gait. Without edema. Skin:  Dry and intact without significant lesions or rashes Neuro: Alert and  oriented x4;  No focal deficits. Psych:  Cooperative. Normal mood and affect.    Doree Albee, PA-C 11:17 AM

## 2023-05-20 ENCOUNTER — Ambulatory Visit (HOSPITAL_COMMUNITY)
Admission: RE | Admit: 2023-05-20 | Discharge: 2023-05-20 | Disposition: A | Discharge status: Home / Self Care | Payer: Medicare Other | Source: Ambulatory Visit | Attending: Physician Assistant | Admitting: Physician Assistant

## 2023-05-20 DIAGNOSIS — E041 Nontoxic single thyroid nodule: Secondary | ICD-10-CM | POA: Diagnosis present

## 2023-05-27 ENCOUNTER — Other Ambulatory Visit: Payer: Self-pay

## 2023-05-27 DIAGNOSIS — E041 Nontoxic single thyroid nodule: Secondary | ICD-10-CM

## 2023-06-17 ENCOUNTER — Encounter: Payer: Self-pay | Admitting: Certified Registered Nurse Anesthetist

## 2023-06-21 ENCOUNTER — Ambulatory Visit: Payer: Medicare Other | Admitting: Gastroenterology

## 2023-06-21 ENCOUNTER — Encounter: Payer: Self-pay | Admitting: Gastroenterology

## 2023-06-21 VITALS — BP 120/82 | HR 56 | Temp 97.2°F | Resp 11 | Ht 74.5 in | Wt 199.4 lb

## 2023-06-21 DIAGNOSIS — Z1211 Encounter for screening for malignant neoplasm of colon: Secondary | ICD-10-CM | POA: Diagnosis not present

## 2023-06-21 DIAGNOSIS — D122 Benign neoplasm of ascending colon: Secondary | ICD-10-CM

## 2023-06-21 DIAGNOSIS — D125 Benign neoplasm of sigmoid colon: Secondary | ICD-10-CM | POA: Diagnosis not present

## 2023-06-21 DIAGNOSIS — K449 Diaphragmatic hernia without obstruction or gangrene: Secondary | ICD-10-CM | POA: Diagnosis not present

## 2023-06-21 DIAGNOSIS — R1319 Other dysphagia: Secondary | ICD-10-CM

## 2023-06-21 DIAGNOSIS — D175 Benign lipomatous neoplasm of intra-abdominal organs: Secondary | ICD-10-CM

## 2023-06-21 DIAGNOSIS — K5730 Diverticulosis of large intestine without perforation or abscess without bleeding: Secondary | ICD-10-CM

## 2023-06-21 DIAGNOSIS — K219 Gastro-esophageal reflux disease without esophagitis: Secondary | ICD-10-CM

## 2023-06-21 DIAGNOSIS — D120 Benign neoplasm of cecum: Secondary | ICD-10-CM | POA: Diagnosis not present

## 2023-06-21 DIAGNOSIS — K222 Esophageal obstruction: Secondary | ICD-10-CM

## 2023-06-21 DIAGNOSIS — K648 Other hemorrhoids: Secondary | ICD-10-CM

## 2023-06-21 MED ORDER — SODIUM CHLORIDE 0.9 % IV SOLN: 500.0000 mL | Freq: Once | INTRAVENOUS | Status: DC

## 2023-06-21 NOTE — Progress Notes (Signed)
 1256 Robinul 0.1 mg IV given due large amount of secretions upon assessment.  MD made aware, vss

## 2023-06-21 NOTE — Patient Instructions (Signed)
-   Resume previous diet - Continue present medications. - Await pathology results - Continue protonix 20mg  / day   YOU HAD AN ENDOSCOPIC PROCEDURE TODAY AT THE [facility]:   Refer to the procedure report that was given to you for any specific questions about what was found during the examination.  If the procedure report does not answer your questions, please call your gastroenterologist to clarify.  If you requested that your care partner not be given the details of your procedure findings, then the procedure report has been included in a sealed envelope for you to review at your convenience later.  YOU SHOULD EXPECT: Some feelings of bloating in the abdomen. Passage of more gas than usual.  Walking can help get rid of the air that was put into your GI tract during the procedure and reduce the bloating. If you had a lower endoscopy (such as a colonoscopy or flexible sigmoidoscopy) you may notice spotting of blood in your stool or on the toilet paper. If you underwent a bowel prep for your procedure, you may not have a normal bowel movement for a few days.  Please Note:  You might notice some irritation and congestion in your nose or some drainage.  This is from the oxygen used during your procedure.  There is no need for concern and it should clear up in a day or so.  SYMPTOMS TO REPORT IMMEDIATELY:  Following lower endoscopy (colonoscopy or flexible sigmoidoscopy):  Excessive amounts of blood in the stool  Significant tenderness or worsening of abdominal pains  Swelling of the abdomen that is new, acute  Fever of 100F or higher  Following upper endoscopy (EGD)  Vomiting of blood or coffee ground material  New chest pain or pain under the shoulder blades  Painful or persistently difficult swallowing  New shortness of breath  Fever of 100F or higher  Black, tarry-looking stools  For urgent or emergent issues, a gastroenterologist can be reached at any hour by calling [phone]  [phone]. Do not use MyChart messaging for urgent concerns.    DIET:  We do recommend a small meal at first, but then you may proceed to your regular diet.  Drink plenty of fluids but you should avoid alcoholic beverages for 24 hours.  ACTIVITY:  You should plan to take it easy for the rest of today and you should NOT DRIVE or use heavy machinery until tomorrow (because of the sedation medicines used during the test).    FOLLOW UP: Our staff will call the number listed on your records the next business day following your procedure.  We will call around 7:15- 8:00 am to check on you and address any questions or concerns that you may have regarding the information given to you following your procedure. If we do not reach you, we will leave a message.     If any biopsies were taken you will be contacted by phone or by letter within the next 1-3 weeks.  Please call us at [phone] if you have not heard about the biopsies in 3 weeks.    SIGNATURES/CONFIDENTIALITY: You and/or your care partner have signed paperwork which will be entered into your electronic medical record.  These signatures attest to the fact that that the information above on your After Visit Summary has been reviewed and is understood.  Full responsibility of the confidentiality of this discharge information lies with you and/or your care-partner.

## 2023-06-21 NOTE — Progress Notes (Signed)
 Report given to PACU, vss

## 2023-06-21 NOTE — Progress Notes (Signed)
 [facility] History and Physical   Primary Care Physician:  Tommy Penna, MD   Reason for Procedure:   GERD / dysphagia, CRC screening  Plan:    EGD with possible dilation, colonoscopy     HPI: [name] is a 73 y.o. male  here for EGD and colonoscopy to evaluate issues as outlined.  HIstory of GERD, also with some dysphagia. NO prior EGD. Started protonix 20mg  / day and that has really helped his symptoms since office visit. EGD to evaluate. Last colonoscopy 2014 with Dr. Juanda Chance was normal. Patient denies any bowel symptoms at this time. No family history of colon cancer known. Otherwise feels well without any cardiopulmonary symptoms.   I have discussed risks / benefits of anesthesia and endoscopic procedure with [name] and they wish to proceed with the exams as outlined today.    Past Medical History:  Diagnosis Date   Adult attention deficit disorder    Benign prostatic hypertrophy    Bradycardia    Diverticulitis of colon    colonoscopy 07/11/2000   HTN (hypertension)    Nephrolithiasis    prostatitis with noncaseating granulomatous inflammation July 2000 negative IPPD.     Syncope     Past Surgical History:  Procedure Laterality Date   CYSTOSTOMY W/ BLADDER BIOPSY     from trigone   TONSILLECTOMY  03/26/1956   TRANSURETHRAL RESECTION OF PROSTATE     US ECHOCARDIOGRAPHY  07/06/2008    Prior to Admission medications   Medication Sig Start Date End Date Taking? Authorizing Provider  acetaminophen (TYLENOL) 500 MG tablet Take 1,000 mg by mouth every 6 (six) hours as needed for mild pain.   Yes [provider]  allopurinol (ZYLOPRIM) 300 MG tablet Take 1 tablet (300 mg total) by mouth daily. 06/19/19  Yes Nyoka Cowden, MD  amLODipine (NORVASC) 5 MG tablet    Yes [provider]  aspirin EC 81 MG tablet Take 1 tablet (81 mg total) by mouth daily. 05/15/16  Yes Nyoka Cowden, MD  cholecalciferol (VITAMIN D3) 25  MCG (1000 UNIT) tablet Take 1,000 Units by mouth daily.   Yes [provider]  escitalopram (LEXAPRO) 10 MG tablet Take 10 mg by mouth daily. 07/25/22  Yes [provider]  guanFACINE (TENEX) 1 MG tablet TAKE ONE AND ONE-HALF TABLETS AT BEDTIME 07/13/19  Yes Nyoka Cowden, MD  metoprolol succinate (TOPROL-XL) 25 MG 24 hr tablet 25 mg every morning. 06/04/16  Yes [provider]  pantoprazole (PROTONIX) 20 MG tablet Take 1 tablet (20 mg total) by mouth daily before breakfast. 05/16/23  Yes Doree Albee, PA-C  spironolactone (ALDACTONE) 25 MG tablet Take by mouth. 05/14/21  Yes [provider]  telmisartan (MICARDIS) 80 MG tablet Take 80 mg by mouth daily. 11/02/19  Yes [provider]  fluorouracil (EFUDEX) 5 % cream Apply 1 Application topically 2 (two) times daily. Patient not taking: Reported on 06/21/2023 12/22/21   [provider]  ibuprofen (ADVIL) 200 MG tablet Take 600-800 mg by mouth every 6 (six) hours as needed for moderate pain. Patient not taking: Reported on 05/16/2023    [provider]  sildenafil (REVATIO) 20 MG tablet Take 20 mg by mouth daily as needed.  Patient not taking: Reported on 06/21/2023 11/24/18   [provider]  valACYclovir (VALTREX) 1000 MG tablet 1 tablet Orally three times per day for 7 days Patient not taking: Reported on 06/21/2023 05/06/23   [provider]    Current Outpatient Medications  Medication Sig Dispense Refill   acetaminophen (TYLENOL) 500 MG tablet Take 1,000 mg by mouth every 6 (six) hours as needed for mild pain.     allopurinol (ZYLOPRIM) 300 MG tablet Take 1 tablet (300 mg total) by mouth daily. 90 tablet 3   amLODipine (NORVASC) 5 MG tablet      aspirin EC 81 MG tablet Take 1 tablet (81 mg total) by mouth daily.     cholecalciferol (VITAMIN D3) 25 MCG (1000 UNIT) tablet Take 1,000 Units by mouth daily.     escitalopram (LEXAPRO) 10 MG tablet Take 10 mg by mouth  daily.     guanFACINE (TENEX) 1 MG tablet TAKE ONE AND ONE-HALF TABLETS AT BEDTIME 135 tablet 3   metoprolol succinate (TOPROL-XL) 25 MG 24 hr tablet 25 mg every morning.     pantoprazole (PROTONIX) 20 MG tablet Take 1 tablet (20 mg total) by mouth daily before breakfast. 90 tablet 3   spironolactone (ALDACTONE) 25 MG tablet Take by mouth.     telmisartan (MICARDIS) 80 MG tablet Take 80 mg by mouth daily.     fluorouracil (EFUDEX) 5 % cream Apply 1 Application topically 2 (two) times daily. (Patient not taking: Reported on 06/21/2023)     ibuprofen (ADVIL) 200 MG tablet Take 600-800 mg by mouth every 6 (six) hours as needed for moderate pain. (Patient not taking: Reported on 05/16/2023)     sildenafil (REVATIO) 20 MG tablet Take 20 mg by mouth daily as needed.  (Patient not taking: Reported on 06/21/2023)     valACYclovir (VALTREX) 1000 MG tablet 1 tablet Orally three times per day for 7 days (Patient not taking: Reported on 06/21/2023)     Current Facility-Administered Medications  Medication Dose Route Frequency Provider Last Rate Last Admin   0.9 %  sodium chloride infusion  500 mL Intravenous Once Darnella Zeiter, Willaim Rayas, MD        Allergies as of 06/21/2023 - Review Complete 06/21/2023  Allergen Reaction Noted   Amoxicillin Rash     Family History  Problem Relation Age of Onset   Heart disease Mother 19       IHD with CABG   Kidney failure Father    Hypertension Father    Lung cancer Sister    Ehlers-Danlos syndrome Child    Colon cancer Neg Hx    Esophageal cancer Neg Hx    Rectal cancer Neg Hx    Stomach cancer Neg Hx    Colon polyps Neg Hx     Social History   Socioeconomic History   Marital status: Married    Spouse name: Not on file   Number of children: Not on file   Years of education: Not on file   Highest education level: Not on file  Occupational History   Occupation: Lawyer    Employer: [employer]  Tobacco Use   Smoking status: Never   Smokeless  tobacco: Never  Vaping Use   Vaping status: Never Used  Substance and Sexual Activity   Alcohol use: Yes    Alcohol/week: 3.0 standard drinks of alcohol    Types: 3 Glasses of wine per week   Drug use: No   Sexual activity: Not on file  Other Topics Concern   Not on file  Social History Narrative   Not on file   Social Drivers of Health   Financial Resource Strain: Not on file  Food Insecurity: No Food  Insecurity (07/18/2021)   Received from [facility], [facility] visits prior to 05/26/2022., [facility] visits prior to 05/26/2022.   Hunger Vital Sign    Worried About Running Out of Food in the Last Year: Never true    Ran Out of Food in the Last Year: Never true  Transportation Needs: Not on file  Physical Activity: Not on file  Stress: Not on file  Social Connections: Unknown (08/06/2021)   Received from [facility], [facility]   Social Network    Social Network: Not on file  Intimate Partner Violence: Unknown (06/28/2021)   Received from [facility], [facility]   HITS    Physically Hurt: Not on file    Insult or Talk Down To: Not on file    Threaten Physical Harm: Not on file    Scream or Curse: Not on file    Review of Systems: All other review of systems negative except as mentioned in the HPI.  Physical Exam: Vital signs BP (!) 157/97   Pulse 66   Temp (!) 97.2 F (36.2 C)   Resp 13   Ht 6' 2.5" (1.892 m)   Wt 199 lb 6.4 oz (90.4 kg)   SpO2 99%   BMI 25.26 kg/m   General:   Alert,  Well-developed, pleasant and cooperative in NAD Lungs:  Clear throughout to auscultation.   Heart:  Regular rate and rhythm Abdomen:  Soft, nontender and nondistended.   Neuro/Psych:  Alert and cooperative. Normal mood and affect. A and O x 3  Harlin Rain, MD [facility]

## 2023-06-21 NOTE — Op Note (Signed)
 [facility] Patient Name: [name] Procedure Date: 06/21/2023 1:13 PM MRN: [id number] Endoscopist: Viviann Spare P. Adela Lank , MD, [id number] Age: 73 Referring MD:  Date of Birth: 09/11/1950 Gender: Male Account #: [id number] Procedure:                Upper GI endoscopy Indications:              Dysphagia, Follow-up of gastro-esophageal reflux                            disease - on protonix with significant improvement                            in symptoms since starting this Medicines:                Monitored Anesthesia Care Procedure:                Pre-Anesthesia Assessment:                           - Prior to the procedure, a History and Physical                            was performed, and patient medications and                            allergies were reviewed. The patient's tolerance of                            previous anesthesia was also reviewed. The risks                            and benefits of the procedure and the sedation                            options and risks were discussed with the patient.                            All questions were answered, and informed consent                            was obtained. Prior Anticoagulants: The patient has                            taken no anticoagulant or antiplatelet agents. ASA                            Grade Assessment: II - A patient with mild systemic                            disease. After reviewing the risks and benefits,                            the patient was deemed in satisfactory condition to  undergo the procedure.                           After obtaining informed consent, the endoscope was                            passed under direct vision. Throughout the                            procedure, the patient's blood pressure, pulse, and                            oxygen saturations were monitored continuously. The                            GIF HQ190  #[id number] was introduced through the                            mouth, and advanced to the second part of duodenum.                            The upper GI endoscopy was accomplished without                            difficulty. The patient tolerated the procedure                            well. Scope In: Scope Out: Findings:                 Esophagogastric landmarks were identified: the                            Z-line was found at 44 cm, the gastroesophageal                            junction was found at 44 cm and the upper extent of                            the gastric folds was found at 45 cm from the                            incisors.                           A 1 cm hiatal hernia was present.                           One benign-appearing, intrinsic stenosis was found                            44 cm from the incisors. This stenosis measured                            less than one cm (in length). A  TTS dilator was                            passed through the scope. Dilation with a                            15-16.5-18 mm balloon dilator was performed to 15                            mm, 16.5 mm and 18 mm. This was then opened further                            with cold forceps.                           The exam of the esophagus was otherwise normal. No                            Barrett's                           The entire examined stomach was normal.                           The examined duodenum was normal. Complications:            No immediate complications. Estimated blood loss:                            Minimal. Estimated Blood Loss:     Estimated blood loss was minimal. Impression:               - Esophagogastric landmarks identified.                           - 1 cm hiatal hernia.                           - Benign-appearing esophageal stenosis. Dilated to                            18mm and opened further with forceps.                           - Normal  stomach.                           - Normal examined duodenum. Recommendation:           - Patient has a contact number available for                            emergencies. The signs and symptoms of potential                            delayed complications were discussed with the  patient. Return to normal activities tomorrow.                            Written discharge instructions were provided to the                            patient.                           - Resume previous diet.                           - Continue present medications.                           - Continue protonix 20mg  / day                           - Await course post dilation Ladonne Sharples P. Charelle Petrakis, MD 06/21/2023 2:09:17 PM This report has been signed electronically.

## 2023-06-21 NOTE — Op Note (Signed)
 [facility] Patient Name: [name] Procedure Date: 06/21/2023 1:12 PM MRN: [id number] Endoscopist: Viviann Spare P. Adela Lank , MD, [id number] Age: 73 Referring MD:  Date of Birth: 04-03-1950 Gender: Male Account #: [id number] Procedure:                Colonoscopy Indications:              Screening for colorectal malignant neoplasm Medicines:                Monitored Anesthesia Care Procedure:                Pre-Anesthesia Assessment:                           - Prior to the procedure, a History and Physical                            was performed, and patient medications and                            allergies were reviewed. The patient's tolerance of                            previous anesthesia was also reviewed. The risks                            and benefits of the procedure and the sedation                            options and risks were discussed with the patient.                            All questions were answered, and informed consent                            was obtained. Prior Anticoagulants: The patient has                            taken no anticoagulant or antiplatelet agents. ASA                            Grade Assessment: II - A patient with mild systemic                            disease. After reviewing the risks and benefits,                            the patient was deemed in satisfactory condition to                            undergo the procedure.                           After obtaining informed consent, the colonoscope  was passed under direct vision. Throughout the                            procedure, the patient's blood pressure, pulse, and                            oxygen saturations were monitored continuously. The                            Olympus CF-[id number] ([id number]) Colonoscope was                            introduced through the anus and advanced to the the                            cecum,  identified by appendiceal orifice and                            ileocecal valve. The colonoscopy was performed                            without difficulty. The patient tolerated the                            procedure well. The quality of the bowel                            preparation was adequate. The ileocecal valve,                            appendiceal orifice, and rectum were photographed. Scope In: 1:32:10 PM Scope Out: 2:00:07 PM Scope Withdrawal Time: 0 hours 23 minutes 25 seconds  Total Procedure Duration: 0 hours 27 minutes 57 seconds  Findings:                 The perianal and digital rectal examinations were                            normal.                           A 3 mm polyp was found in the cecum. The polyp was                            sessile. The polyp was removed with a cold snare.                            Resection and retrieval were complete.                           Three sessile polyps were found in the ascending                            colon. The polyps were 2 to 4 mm in size. These  polyps were removed with a cold snare. Resection                            and retrieval were complete.                           There was a large lipoma, at the hepatic flexure.                           A 3 to 4 mm polyp was found in the sigmoid colon.                            The polyp was sessile. The polyp was removed with a                            cold snare. Resection and retrieval were complete.                           Many diverticula were found in the entire colon -                            mild in right and transverse colon, severe in the                            left colon, with numerous stool balls from                            diverticulosis in the lumen of the colon which took                            some time to clear and prolonged the exam.                           Internal hemorrhoids were found during  retroflexion.                           The exam was otherwise without abnormality. Complications:            No immediate complications. Estimated blood loss:                            Minimal. Estimated Blood Loss:     Estimated blood loss was minimal. Impression:               - One 3 mm polyp in the cecum, removed with a cold                            snare. Resected and retrieved.                           - Three 2 to 4 mm polyps in the ascending colon,  removed with a cold snare. Resected and retrieved.                           - Large lipoma at the hepatic flexure.                           - One 3 to 4 mm polyp in the sigmoid colon, removed                            with a cold snare. Resected and retrieved.                           - Diverticulosis in the entire examined colon,                            severe in the left colon.                           - Internal hemorrhoids.                           - The examination was otherwise normal. Recommendation:           - Patient has a contact number available for                            emergencies. The signs and symptoms of potential                            delayed complications were discussed with the                            patient. Return to normal activities tomorrow.                            Written discharge instructions were provided to the                            patient.                           - Resume previous diet.                           - Continue present medications.                           - Await pathology results. Viviann Spare P. Adela Lank, MD 06/21/2023 2:05:54 PM This report has been signed electronically.

## 2023-06-24 ENCOUNTER — Telehealth: Payer: Self-pay | Admitting: *Deleted

## 2023-06-24 NOTE — Telephone Encounter (Signed)
 Attempted post procedure follow up call.  No answer - LVM.

## 2023-06-26 LAB — SURGICAL PATHOLOGY

## 2023-07-01 ENCOUNTER — Encounter: Payer: Self-pay | Admitting: Gastroenterology

## 2023-12-18 NOTE — Progress Notes (Signed)
 This encounter was created in error - please disregard.

## 2024-01-20 ENCOUNTER — Encounter: Payer: Self-pay | Admitting: Physical Medicine and Rehabilitation

## 2024-01-20 ENCOUNTER — Ambulatory Visit (INDEPENDENT_AMBULATORY_CARE_PROVIDER_SITE_OTHER): Admitting: Physical Medicine and Rehabilitation

## 2024-01-20 ENCOUNTER — Other Ambulatory Visit (INDEPENDENT_AMBULATORY_CARE_PROVIDER_SITE_OTHER): Payer: Self-pay

## 2024-01-20 DIAGNOSIS — M5442 Lumbago with sciatica, left side: Secondary | ICD-10-CM

## 2024-01-20 DIAGNOSIS — R1032 Left lower quadrant pain: Secondary | ICD-10-CM | POA: Diagnosis not present

## 2024-01-20 DIAGNOSIS — G8929 Other chronic pain: Secondary | ICD-10-CM

## 2024-01-20 NOTE — Progress Notes (Unsigned)
 Pain Scale   Average Pain 4 Patient advising he was moving last week and did a lot of lifting and he started having lower back pain that radiating to top of left leg.        +Driver, -BT, -Dye Allergies.

## 2024-01-20 NOTE — Progress Notes (Addendum)
 "  [name] - 73 y.o. male MRN [id number]  Date of birth: November 29, 1950  Office Visit Note: Visit Date: 01/20/2024 PCP: Larnell Hamilton, MD Referred by: Larnell Hamilton, MD  Subjective: Chief Complaint  Patient presents with   Lower Back - Pain   HPI: [name] is a 73 y.o. male who comes in today as a self referral for evaluation of chronic, worsening and severe bilateral lower back pain radiating to left groin and anterior thigh. Pain started in August while moving to new condo. His pain worsens with activity, getting in and out of car and walking up stairs. Feels he is limping when walking. His biggest complaint seems to be left groin and anterior thigh. He describes pain as weakness and feels his left leg is going to give way. Describes pain as soreness, currently rates as 8 out of 10. Some relief of pain with home exercise regimen, rest and use of medications. He does exercise with trainer several times a week. Radiographs of left hip from 2021 shows mild degenerative changes.  He does have history of lower back problems, was previously managed by Dr. Charlie Dolores at [facility].  He did undergo lumbar injections with Dr. Dolores, good relief of pain with these injections.  Patient denies focal weakness, numbness and tingling. No recent trauma or falls.      Review of Systems  Musculoskeletal:  Positive for back pain.  Neurological:  Positive for weakness. Negative for tingling and sensory change.  All other systems reviewed and are negative.  Otherwise per HPI.  Assessment & Plan: Visit Diagnoses:    ICD-10-CM   1. Chronic bilateral low back pain with left-sided sciatica  G89.29 XR HIPS BILAT W OR W/O PELVIS 2V   M54.42 Ambulatory referral to Physical Medicine Rehab    2. Groin pain, chronic, left  R10.32 XR HIPS BILAT W OR W/O PELVIS 2V   G89.29 Ambulatory referral to Physical Medicine Rehab       Plan: Findings:  Chronic, worsening and severe bilateral lower  back pain radiating to left groin and anterior thigh down to knee. Patient continues to have severe pain despite good conservative therapies such as home exercise regimen, rest and use of medications. He does have pain with internal rotation of left hip today. I obtained hip radiographs in the office today that show mild degenerative changes to left hip, there is joint space narrowing superiorly. I explained to patient that his symptoms do seem to fit with more intrinsic left hip issue. I placed order for diagnostic and hopefully therapeutic left intra-articular hip injection under fluoroscopic guidance. If good relief of pain we can repeat this procedure infrequently as needed.  She does pain persist will consider referral to Dr. Vernetta for further evaluation of chronic left hip pain.  Patient has no questions at this time.  We will see him back for left hip injection.  No red flag symptoms noted upon exam today.    Meds & Orders: No orders of the defined types were placed in this encounter.   Orders Placed This Encounter  Procedures   XR HIPS BILAT W OR W/O PELVIS 2V   Ambulatory referral to Physical Medicine Rehab    Follow-up: Return for Left intra-articular hip injection.   Procedures: No procedures performed      Clinical History: No specialty comments available.   He reports that he has never smoked. He has never used smokeless tobacco. No results for input(s): HGBA1C, LABURIC  in the last 8760 hours.  Objective:  VS:  HT:    WT:   BMI:     BP:   HR: bpm  TEMP: ( )  RESP:  Physical Exam Vitals and nursing note reviewed.  HENT:     Head: Normocephalic and atraumatic.     Right Ear: External ear normal.     Left Ear: External ear normal.     Nose: Nose normal.     Mouth/Throat:     Mouth: Mucous membranes are moist.  Eyes:     Extraocular Movements: Extraocular movements intact.  Cardiovascular:     Rate and Rhythm: Normal rate.     Pulses: Normal pulses.   Pulmonary:     Effort: Pulmonary effort is normal.  Abdominal:     General: Abdomen is flat. There is no distension.  Musculoskeletal:        General: Tenderness present.     Cervical back: Normal range of motion.     Comments: Patient rises from seated position to standing without difficulty. Good lumbar range of motion. No pain noted with facet loading. 5/5 strength noted with right hip flexion, knee flexion/extension, ankle dorsiflexion/plantarflexion and EHL. 4/5 strength with left hip flexion. No clonus noted bilaterally. No pain upon palpation of greater trochanters. Pain noted with internal rotation of left hip. Sensation intact bilaterally. Negative slump test bilaterally. Ambulates without aid, gait steady.     Skin:    General: Skin is warm and dry.     Capillary Refill: Capillary refill takes less than 2 seconds.  Neurological:     General: No focal deficit present.     Mental Status: He is alert and oriented to person, place, and time.  Psychiatric:        Mood and Affect: Mood normal.        Behavior: Behavior normal.     Ortho Exam  Imaging: No results found.  Past Medical/Family/Surgical/Social History: Medications & Allergies reviewed per EMR, new medications updated. Patient Active Problem List   Diagnosis Date Noted   GERD (gastroesophageal reflux disease) 05/13/2023   Urinary retention due to benign prostatic hyperplasia 07/12/2021   Cluster headache syndrome 11/10/2019   Drug-induced gout 01/23/2019   Bilateral high frequency sensorineural hearing loss 02/25/2018   Tremor 11/08/2016   Epistaxis 05/14/2016   Chronic renal disease, stage I 04/19/2016   Syncope 04/27/2015   Acute URI 01/11/2015   Hyperlipidemia LDL goal <130 10/18/2014   Health care maintenance 04/10/2013   PVC's (premature ventricular contractions) 05/24/2010   PAROXYSMAL ATRIAL FIBRILLATION 03/17/2010   SYNCOPE 07/21/2008   Attention deficit disorder 07/24/2007   Essential  hypertension 07/24/2007   Diverticulosis 07/24/2007   BPH (benign prostatic hyperplasia) 07/24/2007   Past Medical History:  Diagnosis Date   Adult attention deficit disorder    Benign prostatic hypertrophy    Bradycardia    Diverticulitis of colon    colonoscopy 07/11/2000   HTN (hypertension)    Nephrolithiasis    prostatitis with noncaseating granulomatous inflammation July 2000 negative IPPD.     Syncope    Family History  Problem Relation Age of Onset   Heart disease Mother 29       IHD with CABG   Kidney failure Father    Hypertension Father    Lung cancer Sister    Ehlers-Danlos syndrome Child    Colon cancer Neg Hx    Esophageal cancer Neg Hx    Rectal cancer Neg Hx  Stomach cancer Neg Hx    Colon polyps Neg Hx    Past Surgical History:  Procedure Laterality Date   CYSTOSTOMY W/ BLADDER BIOPSY     from trigone   TONSILLECTOMY  03/26/1956   TRANSURETHRAL RESECTION OF PROSTATE     US  ECHOCARDIOGRAPHY  07/06/2008   Social History   Occupational History   Occupation: Scientist, Research (medical): [employer]  Tobacco Use   Smoking status: Never   Smokeless tobacco: Never  Vaping Use   Vaping status: Never Used  Substance and Sexual Activity   Alcohol use: Yes    Alcohol/week: 3.0 standard drinks of alcohol    Types: 3 Glasses of wine per week   Drug use: No   Sexual activity: Not on file    "

## 2024-01-27 ENCOUNTER — Ambulatory Visit (INDEPENDENT_AMBULATORY_CARE_PROVIDER_SITE_OTHER): Admitting: Physical Medicine and Rehabilitation

## 2024-01-27 ENCOUNTER — Encounter: Payer: Self-pay | Admitting: Radiology

## 2024-01-27 ENCOUNTER — Other Ambulatory Visit: Payer: Self-pay

## 2024-01-27 DIAGNOSIS — M25552 Pain in left hip: Secondary | ICD-10-CM | POA: Diagnosis not present

## 2024-01-27 MED ORDER — BUPIVACAINE HCL 0.25 % IJ SOLN
4.0000 mL | INTRAMUSCULAR | Status: AC | PRN
  Administered 2024-01-27: 4 mL via INTRA_ARTICULAR

## 2024-01-27 MED ORDER — TRIAMCINOLONE ACETONIDE 40 MG/ML IJ SUSP
40.0000 mg | INTRAMUSCULAR | Status: AC | PRN
  Administered 2024-01-27: 40 mg via INTRA_ARTICULAR

## 2024-01-27 NOTE — Progress Notes (Signed)
 Pain Scale   Average Pain 2 Patient advising he has chronic left hip pain, pain is constant        +Driver, -BT, -Dye Allergies.

## 2024-01-27 NOTE — Progress Notes (Signed)
 [name] - 73 y.o. male MRN [id number]  Date of birth: 22-Nov-1950  Office Visit Note: Visit Date: 01/27/2024 PCP: Larnell Hamilton, MD Referred by: Larnell Hamilton, MD  Subjective: Chief Complaint  Patient presents with   Left Hip - Pain   HPI:  [name] is a 73 y.o. male who comes in today at the request of Duwaine Pouch, FNP for planned Left anesthetic hip arthrogram with fluoroscopic guidance.  The patient has failed conservative care including home exercise, medications, time and activity modification.  This injection will be diagnostic and hopefully therapeutic.  Please see requesting physician notes for further details and justification.    ROS Otherwise per HPI.  Assessment & Plan: Visit Diagnoses:    ICD-10-CM   1. Pain in left hip  M25.552 Large Joint Inj: L hip joint    XR C-ARM NO REPORT      Plan: No additional findings.   Meds & Orders: No orders of the defined types were placed in this encounter.   Orders Placed This Encounter  Procedures   Large Joint Inj: L hip joint   XR C-ARM NO REPORT    Follow-up: Return for visit to requesting provider as needed.   Procedures: Large Joint Inj: L hip joint on 01/27/2024 8:21 AM Indications: diagnostic evaluation and pain Details: 22 G 3.5 in needle, fluoroscopy-guided anterior approach  Arthrogram: No  Medications: 4 mL bupivacaine 0.25 %; 40 mg triamcinolone acetonide 40 MG/ML Aspirate: 5 mL serous and clear Outcome: tolerated well, no immediate complications  There was excellent flow of contrast producing a partial arthrogram of the hip. The patient did have relief of symptoms during the anesthetic phase of the injection. Procedure, treatment alternatives, risks and benefits explained, specific risks discussed. Consent was given by the patient. Immediately prior to procedure a time out was called to verify the correct patient, procedure, equipment, support staff and site/side marked as  required. Patient was prepped and draped in the usual sterile fashion.          Clinical History: No specialty comments available.     Objective:  VS:  HT:    WT:   BMI:     BP:   HR: bpm  TEMP: ( )  RESP:  Physical Exam Vitals and nursing note reviewed.  Constitutional:      General: He is not in acute distress.    Appearance: Normal appearance. He is not ill-appearing.  HENT:     Head: Normocephalic and atraumatic.     Right Ear: External ear normal.     Left Ear: External ear normal.     Nose: No congestion.  Eyes:     Extraocular Movements: Extraocular movements intact.  Cardiovascular:     Rate and Rhythm: Normal rate.     Pulses: Normal pulses.  Pulmonary:     Effort: Pulmonary effort is normal. No respiratory distress.  Abdominal:     General: There is no distension.     Palpations: Abdomen is soft.  Musculoskeletal:        General: No tenderness or signs of injury.     Cervical back: Neck supple.     Right lower leg: No edema.     Left lower leg: No edema.     Comments: Patient has good distal strength without clonus.  Skin:    Findings: No erythema or rash.  Neurological:     General: No focal deficit present.     Mental  Status: He is alert and oriented to person, place, and time.     Sensory: No sensory deficit.     Motor: No weakness or abnormal muscle tone.     Coordination: Coordination normal.  Psychiatric:        Mood and Affect: Mood normal.        Behavior: Behavior normal.      Imaging: No results found.

## 2024-02-05 ENCOUNTER — Encounter: Payer: Self-pay | Admitting: Physical Medicine and Rehabilitation

## 2024-02-05 DIAGNOSIS — M25552 Pain in left hip: Secondary | ICD-10-CM

## 2024-02-05 DIAGNOSIS — G8929 Other chronic pain: Secondary | ICD-10-CM

## 2024-02-07 NOTE — Addendum Note (Signed)
 Addended by: ELDONNA GARDINER POUR on: 02/07/2024 08:19 AM   Modules accepted: Orders

## 2024-02-26 NOTE — Therapy (Signed)
 OUTPATIENT PHYSICAL THERAPY LOWER EXTREMITY EVALUATION   Patient Name: [name] MRN: [id number] DOB:07/27/1950, 73 y.o., male Today's Date: 02/27/2024  END OF SESSION:  PT End of Session - 02/27/24 1600     Visit Number 1    Number of Visits 16    Date for Recertification  04/23/24    Authorization Type MEDICARE    PT Start Time 1347    PT Stop Time 1432    PT Time Calculation (min) 45 min    Activity Tolerance Patient tolerated treatment well;No increased pain    Behavior During Therapy [facility] for tasks assessed/performed          Past Medical History:  Diagnosis Date   Adult attention deficit disorder    Benign prostatic hypertrophy    Bradycardia    Diverticulitis of colon    colonoscopy 07/11/2000   HTN (hypertension)    Nephrolithiasis    prostatitis with noncaseating granulomatous inflammation July 2000 negative IPPD.     Syncope    Past Surgical History:  Procedure Laterality Date   CYSTOSTOMY W/ BLADDER BIOPSY     from trigone   TONSILLECTOMY  03/26/1956   TRANSURETHRAL RESECTION OF PROSTATE     US  ECHOCARDIOGRAPHY  07/06/2008   Patient Active Problem List   Diagnosis Date Noted   GERD (gastroesophageal reflux disease) 05/13/2023   Urinary retention due to benign prostatic hyperplasia 07/12/2021   Cluster headache syndrome 11/10/2019   Drug-induced gout 01/23/2019   Bilateral high frequency sensorineural hearing loss 02/25/2018   Tremor 11/08/2016   Epistaxis 05/14/2016   Chronic renal disease, stage I 04/19/2016   Syncope 04/27/2015   Acute URI 01/11/2015   Hyperlipidemia LDL goal <130 10/18/2014   Health care maintenance 04/10/2013   PVC's (premature ventricular contractions) 05/24/2010   PAROXYSMAL ATRIAL FIBRILLATION 03/17/2010   SYNCOPE 07/21/2008   Attention deficit disorder 07/24/2007   Essential hypertension 07/24/2007   Diverticulosis 07/24/2007   BPH (benign prostatic hyperplasia) 07/24/2007    PCP: Glendia Freeman,  MD  REFERRING PROVIDER: Gardiner Masters, MD  REFERRING DIAG: [phone] (ICD-10-CM) - Pain in left hip R10.32,G89.29 (ICD-10-CM) - Groin pain, chronic, left   THERAPY DIAG:  Difficulty in walking, not elsewhere classified - Plan: PT plan of care cert/re-cert  Abnormal posture - Plan: PT plan of care cert/re-cert  Muscle weakness (generalized) - Plan: PT plan of care cert/re-cert  Stiffness of left hip, not elsewhere classified - Plan: PT plan of care cert/re-cert  Rationale for Evaluation and Treatment: Rehabilitation  ONSET DATE: Summer of 2025 with moving  SUBJECTIVE:   SUBJECTIVE STATEMENT: Trip notes low back and left lower extremity pain dating back to moving over the summer.  He couldn't lift the left leg to get into the car initially.  Symptoms are proximal to the knee, have improved but still limit his function.  PERTINENT HISTORY: ADD, HTN, syncope, stage 1 renal disease, cardiac history   PAIN:  Are you having pain? Yes: NPRS scale: Groin 0/10; Left Thigh 4/10; Low back 4/10 this week Pain location: Thigh pain with movement/strain; Low back soreness Pain description: See above Aggravating factors: Getting out of a chair, walking, bending Relieving factors: Tylenol , thigh is better with rest  PRECAUTIONS: None  RED FLAGS: None   WEIGHT BEARING RESTRICTIONS: No  FALLS:  Has patient fallen in last 6 months? No  LIVING ENVIRONMENT: Lives with: lives with their family and lives with their spouse Lives in: House/apartment Stairs: Avoids when possible Has following equipment  at home: wife has stuff for Parkinsons  OCCUPATION: Retired clinical research associate   PLOF: Independent  PATIENT GOALS: Walk 1/2 mile or more without pain, easier out of a chair, lift with good mechanics  NEXT MD VISIT: NA  OBJECTIVE:  Note: Objective measures were completed at Evaluation unless otherwise noted.  DIAGNOSTIC FINDINGS: 2 view radiographs of pelvis and hips show mild degenerative change  to  left hip, there is joint spacing noted superiorly. No fractures noted.  PATIENT SURVEYS:  PSFS: THE PATIENT SPECIFIC FUNCTIONAL SCALE  Place score of 0-10 (0 = unable to perform activity and 10 = able to perform activity at the same level as before injury or problem)  Activity Date: 02/27/2024    Walk the dog 5    2.  Move without pain 2    3.  Bend/stoop 5    4.  Perform leg exercises 3    Total Score 3.75      Total Score = Sum of activity scores/number of activities  Minimally Detectable Change: 3 points (for single activity); 2 points (for average score)  [employer] (nd). The Patient Specific Functional Scale . Retrieved from Skateo[employer].com.pt   COGNITION: Overall cognitive status: Within functional limits for tasks assessed     SENSATION: [name] notes left anterior thigh symptoms as distal as the knee   MUSCLE LENGTH: Hamstrings: Right 35 deg; Left 40 deg  POSTURE: rounded shoulders, forward head, and decreased lumbar lordosis   LOWER EXTREMITY ROM:  Passive ROM Left/Right 02/27/2024  Left eval  Hip flexion 80/100    Hip extension     Hip abduction     Hip adduction     Hip internal rotation NT/10    Hip external rotation NT/26    Knee flexion     Knee extension     Ankle dorsiflexion     Ankle plantarflexion     Ankle inversion     Ankle eversion     Trunk extension 0     (Blank rows = not tested)  LOWER EXTREMITY MMT:  MMT Left/Right 02/27/2024   Hip flexion 3/5   Hip extension    Hip abduction 3/5   Hip adduction    Hip internal rotation    Hip external rotation    Knee flexion    Knee extension 4-/4+   Ankle dorsiflexion    Ankle plantarflexion    Ankle inversion    Ankle eversion     (Blank rows = not tested)  GAIT: Distance walked: 100 feet Assistive device utilized: None Level of assistance: Complete Independence Comments: [name] has a noticeable limp when  walking                                                                                                                                TREATMENT DATE:  02/27/2024 Single knee-to-chest stretch with opposite leg straight 4 x 20 seconds Supine figure 4 stretch 4  x 20 seconds Standing lumbar extension AROM 10 x 3 seconds  Neuromuscular re-education: Tandem balance 4 x 20 seconds  02464: Practical bed mobility (log roll); reviewed spine anatomy with the spine model; emphasized the importance of avoiding flexion and flexion with rotation; reviewed day 1 examination findings and his home exercise program   PATIENT EDUCATION:  Education details: See above Person educated: Patient Education method: Explanation, Demonstration, Tactile cues, Verbal cues, and Handouts Education comprehension: verbalized understanding, returned demonstration, verbal cues required, tactile cues required, and needs further education  HOME EXERCISE PROGRAM: Access Code: [email or web address] URL: [email or web address] Date: 02/27/2024 Prepared by: Lamar Ivory  Exercises - Single Knee to Chest Stretch  - 2-3 x daily - 7 x weekly - 1 sets - 5 reps - 20 seconds hold - Supine Figure 4 Piriformis Stretch  - 2-3 x daily - 7 x weekly - 1 sets - 5 reps - 20 seconds hold - Standing Lumbar Extension at Wall - Forearms  - 5 x daily - 7 x weekly - 1 sets - 5 reps - 3 seconds hold - Tandem Stance  - 1-2 x daily - 7 x weekly - 1 sets - 10 reps - 20 second hold  ASSESSMENT:  CLINICAL IMPRESSION: Patient is a 73 y.o. male who was seen today for physical therapy evaluation and treatment for M25.552 (ICD-10-CM) - Pain in left hip R10.32,G89.29 (ICD-10-CM) - Groin pain, chronic, left.  Trip notes some subjective progress since his left groin injection, although he still has weakness, difficulty getting out of the car and gait impairments.  Significant clinical findings include postural abnormalities, limited lumbar extension  AROM, limited left hip active range of motion and left hip weakness, particularly with hip flexors and abductors.  Trip will also benefit from low back strength and body mechanics work to address all impairments noted during today's evaluation.  OBJECTIVE IMPAIRMENTS: Abnormal gait, decreased activity tolerance, decreased endurance, decreased knowledge of condition, difficulty walking, decreased ROM, decreased strength, decreased safety awareness, impaired perceived functional ability, impaired flexibility, improper body mechanics, postural dysfunction, and pain.   ACTIVITY LIMITATIONS: carrying, lifting, bending, stairs, and locomotion level  PARTICIPATION LIMITATIONS: community activity  PERSONAL FACTORS: ADD, HTN, syncope, stage 1 renal disease, cardiac history are also affecting patient's functional outcome.    REHAB POTENTIAL: Good  CLINICAL DECISION MAKING: Evolving/moderate complexity  EVALUATION COMPLEXITY: Moderate   GOALS: Goals reviewed with patient? Yes  SHORT TERM GOALS: Target date: 03/26/2024 Trip will be independent with his day 1 home exercise program Baseline: Started 02/27/2024 Goal status: INITIAL  2.  Lumbar extension AROM to at least 5 degrees Baseline: 0 degrees Goal status: INITIAL  3.  Improve left hip active range of motion for flexion to at least 90 and hip external rotation to at least 25 degrees Baseline: 80 and unable to assume test postures respectively   Goal status: INITIAL  4.  Improve left lower extremity strength as assessed by MMT Baseline: See objective Goal status: INITIAL  LONG TERM GOALS: Target date: 04/23/2024  Improve patient-specific functional score to at least 6 Baseline: 3.75 Goal status: INITIAL  2.  Improve low back and left thigh pain to consistently 2/10 or better on the visual analog scale Baseline: 4/10 Goal status: INITIAL  3.  Improve lumbar extension AROM to 10 degrees Baseline: 0 degrees Goal status:  INITIAL  4.  Improve left hip flexibility for hamstrings to at least 50 degrees and external rotation to at least 30 degrees Baseline: 40 and unable  to assess respectively Goal status: INITIAL  5.  Improve left hip strength by at least 1 MMT grade for hip flexors and hip abductors Baseline: See objective Goal status: INITIAL  6.  Trip will be independent with his long-term maintenance home exercise program at discharge Baseline: Started 02/27/2024 Goal status: INITIAL   PLAN:  PT FREQUENCY: 1-2x/week  PT DURATION: 8 weeks  PLANNED INTERVENTIONS: 97750- Physical Performance Testing, 97110-Therapeutic exercises, 97530- Therapeutic activity, [id number]- Neuromuscular re-education, 97535- Self Care, 02859- Manual therapy, [phone]- Gait training, [phone]- Traction (mechanical), [phone] (1-2 muscles), 20561 (3+ muscles)- Dry Needling, Patient/Family education, Stair training, Joint mobilization, Spinal mobilization, Cryotherapy, and Moist heat  PLAN FOR NEXT SESSION: Review technique with his day 1 home exercise program.  Reviewed posture and body mechanics basics along with activities to improve low back, hip abductors, hip flexors and quadriceps strength to meet long-term goals.   Myer LELON Ivory, PT, MPT 02/27/2024, 5:43 PM

## 2024-02-27 ENCOUNTER — Ambulatory Visit: Admitting: Rehabilitative and Restorative Service Providers"

## 2024-02-27 ENCOUNTER — Encounter: Payer: Self-pay | Admitting: Rehabilitative and Restorative Service Providers"

## 2024-02-27 DIAGNOSIS — M6281 Muscle weakness (generalized): Secondary | ICD-10-CM | POA: Diagnosis not present

## 2024-02-27 DIAGNOSIS — R293 Abnormal posture: Secondary | ICD-10-CM

## 2024-02-27 DIAGNOSIS — M25652 Stiffness of left hip, not elsewhere classified: Secondary | ICD-10-CM

## 2024-02-27 DIAGNOSIS — R262 Difficulty in walking, not elsewhere classified: Secondary | ICD-10-CM

## 2024-03-06 ENCOUNTER — Ambulatory Visit: Admitting: Rehabilitative and Restorative Service Providers"

## 2024-03-06 ENCOUNTER — Encounter: Payer: Self-pay | Admitting: Rehabilitative and Restorative Service Providers"

## 2024-03-06 DIAGNOSIS — M25652 Stiffness of left hip, not elsewhere classified: Secondary | ICD-10-CM | POA: Diagnosis not present

## 2024-03-06 DIAGNOSIS — R262 Difficulty in walking, not elsewhere classified: Secondary | ICD-10-CM

## 2024-03-06 DIAGNOSIS — M6281 Muscle weakness (generalized): Secondary | ICD-10-CM | POA: Diagnosis not present

## 2024-03-06 DIAGNOSIS — R293 Abnormal posture: Secondary | ICD-10-CM

## 2024-03-06 NOTE — Therapy (Signed)
 OUTPATIENT PHYSICAL THERAPY LOWER EXTREMITY TREATMENT   Patient Name: [name] MRN: [id number] DOB:June 09, 1950, 73 y.o., male Today's Date: 03/06/2024  END OF SESSION:  PT End of Session - 03/06/24 1259     Visit Number 2    Number of Visits 16    Date for Recertification  04/23/24    Authorization Type MEDICARE    PT Start Time 1259    PT Stop Time 1344    PT Time Calculation (min) 45 min    Activity Tolerance Patient tolerated treatment well;No increased pain    Behavior During Therapy [facility] for tasks assessed/performed           Past Medical History:  Diagnosis Date   Adult attention deficit disorder    Benign prostatic hypertrophy    Bradycardia    Diverticulitis of colon    colonoscopy 07/11/2000   HTN (hypertension)    Nephrolithiasis    prostatitis with noncaseating granulomatous inflammation July 2000 negative IPPD.     Syncope    Past Surgical History:  Procedure Laterality Date   CYSTOSTOMY W/ BLADDER BIOPSY     from trigone   TONSILLECTOMY  03/26/1956   TRANSURETHRAL RESECTION OF PROSTATE     US  ECHOCARDIOGRAPHY  07/06/2008   Patient Active Problem List   Diagnosis Date Noted   GERD (gastroesophageal reflux disease) 05/13/2023   Urinary retention due to benign prostatic hyperplasia 07/12/2021   Cluster headache syndrome 11/10/2019   Drug-induced gout 01/23/2019   Bilateral high frequency sensorineural hearing loss 02/25/2018   Tremor 11/08/2016   Epistaxis 05/14/2016   Chronic renal disease, stage I 04/19/2016   Syncope 04/27/2015   Acute URI 01/11/2015   Hyperlipidemia LDL goal <130 10/18/2014   Health care maintenance 04/10/2013   PVC's (premature ventricular contractions) 05/24/2010   PAROXYSMAL ATRIAL FIBRILLATION 03/17/2010   SYNCOPE 07/21/2008   Attention deficit disorder 07/24/2007   Essential hypertension 07/24/2007   Diverticulosis 07/24/2007   BPH (benign prostatic hyperplasia) 07/24/2007    PCP: Tommy Freeman,  MD  REFERRING PROVIDER: Gardiner Masters, MD  REFERRING DIAG: [phone] (ICD-10-CM) - Pain in left hip R10.32,G89.29 (ICD-10-CM) - Groin pain, chronic, left   THERAPY DIAG:  Difficulty in walking, not elsewhere classified  Abnormal posture  Muscle weakness (generalized)  Stiffness of left hip, not elsewhere classified  Rationale for Evaluation and Treatment: Rehabilitation  ONSET DATE: Summer of 2025 with moving  SUBJECTIVE:   SUBJECTIVE STATEMENT: Trip notes C HEP compliance.  Trip notes low back and left lower extremity pain dating back to moving over the summer.  He couldn't lift the left leg to get into the car initially.  Symptoms are proximal to the knee, have improved but still limit his function.  PERTINENT HISTORY: ADD, HTN, syncope, stage 1 renal disease, cardiac history   PAIN:  Are you having pain? Yes: NPRS scale: Groin 0/10; Left Thigh Constant 4/10 with movement; Low back 0-3/10 this week Pain location: Thigh pain with movement/strain; Low back soreness Pain description: See above Aggravating factors: Getting out of a chair, walking, bending Relieving factors: Tylenol , thigh is better with rest  PRECAUTIONS: None  RED FLAGS: None   WEIGHT BEARING RESTRICTIONS: No  FALLS:  Has patient fallen in last 6 months? No  LIVING ENVIRONMENT: Lives with: lives with their family and lives with their spouse Lives in: House/apartment Stairs: Avoids when possible Has following equipment at home: wife has stuff for Parkinsons  OCCUPATION: Retired clinical research associate   PLOF: Independent  PATIENT GOALS:  Walk 1/2 mile or more without pain, easier out of a chair, lift with good mechanics  NEXT MD VISIT: NA  OBJECTIVE:  Note: Objective measures were completed at Evaluation unless otherwise noted.  DIAGNOSTIC FINDINGS: 2 view radiographs of pelvis and hips show mild degenerative change to  left hip, there is joint spacing noted superiorly. No fractures noted.  PATIENT  SURVEYS:  PSFS: THE PATIENT SPECIFIC FUNCTIONAL SCALE  Place score of 0-10 (0 = unable to perform activity and 10 = able to perform activity at the same level as before injury or problem)  Activity Date: 02/27/2024    Walk the dog 5    2.  Move without pain 2    3.  Bend/stoop 5    4.  Perform leg exercises 3    Total Score 3.75      Total Score = Sum of activity scores/number of activities  Minimally Detectable Change: 3 points (for single activity); 2 points (for average score)  [name] (nd). The Patient Specific Functional Scale . Retrieved from [email or web address]   COGNITION: Overall cognitive status: Within functional limits for tasks assessed     SENSATION: [name] notes left anterior thigh symptoms as distal as the knee   MUSCLE LENGTH: 03/06/2024 Hamstrings: Right 40 deg; Left 40 deg  02/27/2024 Hamstrings: Right 35 deg; Left 40 deg  POSTURE: rounded shoulders, forward head, and decreased lumbar lordosis   LOWER EXTREMITY ROM:  Passive ROM Left/Right 02/27/2024 Left/Right 03/06/2024   Hip flexion 80/100 90/NT   Hip extension     Hip abduction     Hip adduction     Hip internal rotation NT/10 -7/NT   Hip external rotation NT/26 34/NT   Knee flexion     Knee extension     Ankle dorsiflexion     Ankle plantarflexion     Ankle inversion     Ankle eversion     Trunk extension 0     (Blank rows = not tested)  LOWER EXTREMITY MMT:  MMT Left/Right 02/27/2024   Hip flexion 3/5   Hip extension    Hip abduction 3/5   Hip adduction    Hip internal rotation    Hip external rotation    Knee flexion    Knee extension 4-/4+   Ankle dorsiflexion    Ankle plantarflexion    Ankle inversion    Ankle eversion     (Blank rows = not tested)  GAIT: Distance walked: 100 feet Assistive device utilized: None Level of assistance: Complete Independence Comments: [name] has a noticeable limp when  walking                                                                                                                                TREATMENT DATE:  03/06/2024 Single knee-to-chest stretch with opposite leg straight 5 x 20 seconds Supine figure 4 stretch 5 x 20 seconds Supine hamstrings stretch with other  leg straight 4 x 20 seconds Knee to opposite shoulder with opposite leg straight 4 x 20 seconds Side lie clams with Black Thera-Band resistance 10 x 3 seconds each side with slow eccentrics Yoga Bridge 10 x 5 seconds Standing lumbar extension AROM 10 x 3 seconds  Neuromuscular re-education: Tandem balance 4 x 20 seconds  02464: Reviewed practical bed mobility (log roll); reviewed the importance of avoiding flexion and flexion with rotation; reviewed day 1 and progressions to his home exercise program   02/27/2024 Single knee-to-chest stretch with opposite leg straight 4 x 20 seconds Supine figure 4 stretch 4 x 20 seconds Standing lumbar extension AROM 10 x 3 seconds  Neuromuscular re-education: Tandem balance 4 x 20 seconds  02464: Practical bed mobility (log roll); reviewed spine anatomy with the spine model; emphasized the importance of avoiding flexion and flexion with rotation; reviewed day 1 examination findings and his home exercise program   PATIENT EDUCATION:  Education details: See above Person educated: Patient Education method: Explanation, Demonstration, Tactile cues, Verbal cues, and Handouts Education comprehension: verbalized understanding, returned demonstration, verbal cues required, tactile cues required, and needs further education  HOME EXERCISE PROGRAM: Access Code: [email or web address] URL: [email or web address] Date: 03/06/2024 Prepared by: Lamar Ivory  Exercises - Single Knee to Chest Stretch  - 2-3 x daily - 7 x weekly - 1 sets - 5 reps - 20 seconds hold - Supine Figure 4 Piriformis Stretch  - 2-3 x daily - 7 x weekly - 1 sets - 5 reps -  20 seconds hold - Standing Lumbar Extension at Wall - Forearms  - 5 x daily - 7 x weekly - 1 sets - 5 reps - 3 seconds hold - Tandem Stance  - 1-2 x daily - 7 x weekly - 1 sets - 10 reps - 20 second hold - Supine Hamstring Stretch  - 2-3 x daily - 7 x weekly - 1 sets - 5 reps - 20 seconds hold - Supine Gluteus Stretch  - 2-3 x daily - 7 x weekly - 1 sets - 5 reps - 20 seconds hold - Yoga Bridge  - 1 x daily - 7 x weekly - 2 sets - 10 reps - 5 seconds hold - Clamshell with Resistance  - 1 x daily - 7 x weekly - 2 sets - 10 reps - 3 seconds hold  ASSESSMENT:  CLINICAL IMPRESSION: Trip will benefit from flexibility and strength progressions added today.  Continued progress with HEP compliance will also assist in meeting long-term goals.  Patient is a 73 y.o. male who was seen today for physical therapy evaluation and treatment for M25.552 (ICD-10-CM) - Pain in left hip R10.32,G89.29 (ICD-10-CM) - Groin pain, chronic, left.  Trip notes some subjective progress since his left groin injection, although he still has weakness, difficulty getting out of the car and gait impairments.  Significant clinical findings include postural abnormalities, limited lumbar extension AROM, limited left hip active range of motion and left hip weakness, particularly with hip flexors and abductors.  Trip will also benefit from low back strength and body mechanics work to address all impairments noted during today's evaluation.  OBJECTIVE IMPAIRMENTS: Abnormal gait, decreased activity tolerance, decreased endurance, decreased knowledge of condition, difficulty walking, decreased ROM, decreased strength, decreased safety awareness, impaired perceived functional ability, impaired flexibility, improper body mechanics, postural dysfunction, and pain.   ACTIVITY LIMITATIONS: carrying, lifting, bending, stairs, and locomotion level  PARTICIPATION LIMITATIONS: community activity  PERSONAL FACTORS: ADD, HTN, syncope, stage 1 renal  disease, cardiac history are also affecting patient's functional outcome.    REHAB POTENTIAL: Good  CLINICAL DECISION MAKING: Evolving/moderate complexity  EVALUATION COMPLEXITY: Moderate   GOALS: Goals reviewed with patient? Yes  SHORT TERM GOALS: Target date: 03/26/2024 Trip will be independent with his day 1 home exercise program Baseline: Started 02/27/2024 Goal status: Ongoing 03/06/2024  2.  Lumbar extension AROM to at least 5 degrees Baseline: 0 degrees Goal status: INITIAL  3.  Improve left hip active range of motion for flexion to at least 90 and hip external rotation to at least 25 degrees Baseline: 80 and unable to assume test postures respectively   Goal status: INITIAL  4.  Improve left lower extremity strength as assessed by MMT Baseline: See objective Goal status: INITIAL  LONG TERM GOALS: Target date: 04/23/2024  Improve patient-specific functional score to at least 6 Baseline: 3.75 Goal status: INITIAL  2.  Improve low back and left thigh pain to consistently 2/10 or better on the visual analog scale Baseline: 4/10 Goal status: INITIAL  3.  Improve lumbar extension AROM to 10 degrees Baseline: 0 degrees Goal status: INITIAL  4.  Improve left hip flexibility for hamstrings to at least 50 degrees and external rotation to at least 30 degrees Baseline: 40 and unable to assess respectively Goal status: INITIAL  5.  Improve left hip strength by at least 1 MMT grade for hip flexors and hip abductors Baseline: See objective Goal status: INITIAL  6.  Trip will be independent with his long-term maintenance home exercise program at discharge Baseline: Started 02/27/2024 Goal status: INITIAL   PLAN:  PT FREQUENCY: 1-2x/week  PT DURATION: 8 weeks  PLANNED INTERVENTIONS: 97750- Physical Performance Testing, 97110-Therapeutic exercises, 97530- Therapeutic activity, 97112- Neuromuscular re-education, 97535- Self Care, 02859- Manual therapy, [phone]- Gait  training, [phone]- Traction (mechanical), [phone] (1-2 muscles), [id number] (3+ muscles)- Dry Needling, Patient/Family education, Stair training, Joint mobilization, Spinal mobilization, Cryotherapy, and Moist heat  PLAN FOR NEXT SESSION: Review technique with his current home exercise program.  Review posture and body mechanics basics along with activities to improve low back, hip abductors, hip flexors and quadriceps strength to meet long-term goals.   Myer LELON Ivory, PT, MPT 03/06/2024, 4:57 PM

## 2024-03-12 ENCOUNTER — Ambulatory Visit: Admitting: Rehabilitative and Restorative Service Providers"

## 2024-03-12 DIAGNOSIS — R293 Abnormal posture: Secondary | ICD-10-CM

## 2024-03-12 DIAGNOSIS — R262 Difficulty in walking, not elsewhere classified: Secondary | ICD-10-CM

## 2024-03-12 DIAGNOSIS — M6281 Muscle weakness (generalized): Secondary | ICD-10-CM | POA: Diagnosis not present

## 2024-03-12 DIAGNOSIS — M25652 Stiffness of left hip, not elsewhere classified: Secondary | ICD-10-CM | POA: Diagnosis not present

## 2024-03-12 NOTE — Therapy (Signed)
 OUTPATIENT PHYSICAL THERAPY LOWER EXTREMITY TREATMENT   Patient Name: [name] MRN: [id number] DOB:11-22-1950, 73 y.o., male Today's Date: 03/12/2024  END OF SESSION:  PT End of Session - 03/12/24 1516     Visit Number 3    Number of Visits 16    Date for Recertification  04/23/24    Authorization Type MEDICARE    PT Start Time 1516    PT Stop Time 1600    PT Time Calculation (min) 44 min    Activity Tolerance Patient tolerated treatment well;No increased pain;Patient limited by pain    Behavior During Therapy [facility] for tasks assessed/performed            Past Medical History:  Diagnosis Date   Adult attention deficit disorder    Benign prostatic hypertrophy    Bradycardia    Diverticulitis of colon    colonoscopy 07/11/2000   HTN (hypertension)    Nephrolithiasis    prostatitis with noncaseating granulomatous inflammation July 2000 negative IPPD.     Syncope    Past Surgical History:  Procedure Laterality Date   CYSTOSTOMY W/ BLADDER BIOPSY     from trigone   TONSILLECTOMY  03/26/1956   TRANSURETHRAL RESECTION OF PROSTATE     US  ECHOCARDIOGRAPHY  07/06/2008   Patient Active Problem List   Diagnosis Date Noted   GERD (gastroesophageal reflux disease) 05/13/2023   Urinary retention due to benign prostatic hyperplasia 07/12/2021   Cluster headache syndrome 11/10/2019   Drug-induced gout 01/23/2019   Bilateral high frequency sensorineural hearing loss 02/25/2018   Tremor 11/08/2016   Epistaxis 05/14/2016   Chronic renal disease, stage I 04/19/2016   Syncope 04/27/2015   Acute URI 01/11/2015   Hyperlipidemia LDL goal <130 10/18/2014   Health care maintenance 04/10/2013   PVC's (premature ventricular contractions) 05/24/2010   PAROXYSMAL ATRIAL FIBRILLATION 03/17/2010   SYNCOPE 07/21/2008   Attention deficit disorder 07/24/2007   Essential hypertension 07/24/2007   Diverticulosis 07/24/2007   BPH (benign prostatic hyperplasia) 07/24/2007    PCP:  Glendia Freeman, MD  REFERRING PROVIDER: Gardiner Masters, MD  REFERRING DIAG: [phone] (ICD-10-CM) - Pain in left hip R10.32,G89.29 (ICD-10-CM) - Groin pain, chronic, left   THERAPY DIAG:  Difficulty in walking, not elsewhere classified  Abnormal posture  Muscle weakness (generalized)  Stiffness of left hip, not elsewhere classified  Rationale for Evaluation and Treatment: Rehabilitation  ONSET DATE: Summer of 2025 with moving  SUBJECTIVE:   SUBJECTIVE STATEMENT: Trip notes continued inconsistent HEP compliance.  He notes some new posterior right knee pain along with his anterior left thigh discomfort.  Trip notes low back and left lower extremity pain dating back to moving over the summer.  He couldn't lift the left leg to get into the car initially.  Symptoms are proximal to the knee, have improved but still limit his function.  PERTINENT HISTORY: ADD, HTN, syncope, stage 1 renal disease, cardiac history   PAIN:  Are you having pain? Yes: NPRS scale: Groin 0/10; Left Thigh Constant 4/10 with movement; Low back 0-3/10 this week Pain location: Thigh pain with movement/strain; Low back soreness Pain description: See above Aggravating factors: Getting out of a chair, walking, bending Relieving factors: Tylenol , thigh is better with rest  PRECAUTIONS: None  RED FLAGS: None   WEIGHT BEARING RESTRICTIONS: No  FALLS:  Has patient fallen in last 6 months? No  LIVING ENVIRONMENT: Lives with: lives with their family and lives with their spouse Lives in: House/apartment Stairs: Avoids when possible  Has following equipment at home: wife has stuff for Parkinsons  OCCUPATION: Retired clinical research associate   PLOF: Independent  PATIENT GOALS: Walk 1/2 mile or more without pain, easier out of a chair, lift with good mechanics  NEXT MD VISIT: NA  OBJECTIVE:  Note: Objective measures were completed at Evaluation unless otherwise noted.  DIAGNOSTIC FINDINGS: 2 view radiographs of pelvis  and hips show mild degenerative change to  left hip, there is joint spacing noted superiorly. No fractures noted.  PATIENT SURVEYS:  PSFS: THE PATIENT SPECIFIC FUNCTIONAL SCALE  Place score of 0-10 (0 = unable to perform activity and 10 = able to perform activity at the same level as before injury or problem)  Activity Date: 02/27/2024    Walk the dog 5    2.  Move without pain 2    3.  Bend/stoop 5    4.  Perform leg exercises 3    Total Score 3.75      Total Score = Sum of activity scores/number of activities  Minimally Detectable Change: 3 points (for single activity); 2 points (for average score)  [facility] (nd). The Patient Specific Functional Scale . Retrieved from [email or web address]   COGNITION: Overall cognitive status: Within functional limits for tasks assessed     SENSATION: [name] notes left anterior thigh symptoms as distal as the knee   MUSCLE LENGTH: 03/06/2024 Hamstrings: Right 40 deg; Left 40 deg  02/27/2024 Hamstrings: Right 35 deg; Left 40 deg  POSTURE: rounded shoulders, forward head, and decreased lumbar lordosis   LOWER EXTREMITY ROM:  Passive ROM Left/Right 02/27/2024 Left/Right 03/06/2024 Left/Right 03/12/2024  Hip flexion 80/100 90/NT 90/100  Hip extension     Hip abduction     Hip adduction     Hip internal rotation NT/10 -7/NT -6/11  Hip external rotation NT/26 34/NT 33/34  Knee flexion     Knee extension     Ankle dorsiflexion     Ankle plantarflexion     Ankle inversion     Ankle eversion     Trunk extension 0    Hamstrings   35/35   (Blank rows = not tested)  LOWER EXTREMITY MMT:  MMT Left/Right 02/27/2024   Hip flexion 3/5   Hip extension    Hip abduction 3/5   Hip adduction    Hip internal rotation    Hip external rotation    Knee flexion    Knee extension 4-/4+   Ankle dorsiflexion    Ankle plantarflexion    Ankle inversion    Ankle eversion      (Blank rows = not tested)  GAIT: Distance walked: 100 feet Assistive device utilized: None Level of assistance: Complete Independence Comments: [name] has a noticeable limp when walking                                                                                                                                TREATMENT  DATE:  03/12/2024 Single knee-to-chest stretch with opposite leg straight 4 x 20 seconds Supine figure 4 stretch 4 x 20 seconds Supine hamstrings stretch with other leg straight 4 x 20 seconds Knee to opposite shoulder with opposite leg straight 4 x 20 seconds Side lie clams with Green Thera-Band resistance 10 x 3 seconds each side with slow eccentrics Yoga Bridge 10 x 5 seconds Standing lumbar extension AROM 10 x 3 seconds Supine straight leg raises 2 sets of 5 for 3 seconds and slow eccentrics, focus on maintaining a straight knee  Neuromuscular re-education: Discussed tandem balance  02464: Briefly reviewed practical bed mobility (log roll); reviewed the importance of avoiding flexion and flexion with rotation; reviewed measurements today and changes to his home exercise program     03/06/2024 Single knee-to-chest stretch with opposite leg straight 5 x 20 seconds Supine figure 4 stretch 5 x 20 seconds Supine hamstrings stretch with other leg straight 4 x 20 seconds Knee to opposite shoulder with opposite leg straight 4 x 20 seconds Side lie clams with Black Thera-Band resistance 10 x 3 seconds each side with slow eccentrics Yoga Bridge 10 x 5 seconds Standing lumbar extension AROM 10 x 3 seconds  Neuromuscular re-education: Tandem balance 4 x 20 seconds  97535: Reviewed practical bed mobility (log roll); reviewed the importance of avoiding flexion and flexion with rotation; reviewed day 1 and progressions to his home exercise program   02/27/2024 Single knee-to-chest stretch with opposite leg straight 4 x 20 seconds Supine figure 4 stretch 4 x 20  seconds Standing lumbar extension AROM 10 x 3 seconds  Neuromuscular re-education: Tandem balance 4 x 20 seconds  97535: Practical bed mobility (log roll); reviewed spine anatomy with the spine model; emphasized the importance of avoiding flexion and flexion with rotation; reviewed day 1 examination findings and his home exercise program   PATIENT EDUCATION:  Education details: See above Person educated: Patient Education method: Explanation, Demonstration, Tactile cues, Verbal cues, and Handouts Education comprehension: verbalized understanding, returned demonstration, verbal cues required, tactile cues required, and needs further education  HOME EXERCISE PROGRAM: Access Code: [email or web address] URL: [email or web address] Date: 03/06/2024 Prepared by: Lamar Ivory  Exercises - Single Knee to Chest Stretch  - 2-3 x daily - 7 x weekly - 1 sets - 5 reps - 20 seconds hold - Supine Figure 4 Piriformis Stretch  - 2-3 x daily - 7 x weekly - 1 sets - 5 reps - 20 seconds hold - Standing Lumbar Extension at Wall - Forearms  - 5 x daily - 7 x weekly - 1 sets - 5 reps - 3 seconds hold - Tandem Stance  - 1-2 x daily - 7 x weekly - 1 sets - 10 reps - 20 second hold - Supine Hamstring Stretch  - 2-3 x daily - 7 x weekly - 1 sets - 5 reps - 20 seconds hold - Supine Gluteus Stretch  - 2-3 x daily - 7 x weekly - 1 sets - 5 reps - 20 seconds hold - Yoga Bridge  - 1 x daily - 7 x weekly - 2 sets - 10 reps - 5 seconds hold - Clamshell with Resistance  - 1 x daily - 7 x weekly - 2 sets - 10 reps - 3 seconds hold  ASSESSMENT:  CLINICAL IMPRESSION: Trip had some new posterior right knee pain today along with his standard left thigh symptoms.  We progressed hip flexors/quadriceps strength along with his current hip abductors and low back strength  and lower extremity flexibility work. Continued progress with HEP compliance will also assist in meeting long-term goals.  Patient is a 73 y.o. male who  was seen today for physical therapy evaluation and treatment for M25.552 (ICD-10-CM) - Pain in left hip R10.32,G89.29 (ICD-10-CM) - Groin pain, chronic, left.  Trip notes some subjective progress since his left groin injection, although he still has weakness, difficulty getting out of the car and gait impairments.  Significant clinical findings include postural abnormalities, limited lumbar extension AROM, limited left hip active range of motion and left hip weakness, particularly with hip flexors and abductors.  Trip will also benefit from low back strength and body mechanics work to address all impairments noted during today's evaluation.  OBJECTIVE IMPAIRMENTS: Abnormal gait, decreased activity tolerance, decreased endurance, decreased knowledge of condition, difficulty walking, decreased ROM, decreased strength, decreased safety awareness, impaired perceived functional ability, impaired flexibility, improper body mechanics, postural dysfunction, and pain.   ACTIVITY LIMITATIONS: carrying, lifting, bending, stairs, and locomotion level  PARTICIPATION LIMITATIONS: community activity  PERSONAL FACTORS: ADD, HTN, syncope, stage 1 renal disease, cardiac history are also affecting patient's functional outcome.    REHAB POTENTIAL: Good  CLINICAL DECISION MAKING: Evolving/moderate complexity  EVALUATION COMPLEXITY: Moderate   GOALS: Goals reviewed with patient? Yes  SHORT TERM GOALS: Target date: 03/26/2024 Trip will be independent with his day 1 home exercise program Baseline: Started 02/27/2024 Goal status: Ongoing 03/12/2024  2.  Lumbar extension AROM to at least 5 degrees Baseline: 0 degrees Goal status: INITIAL  3.  Improve left hip active range of motion for flexion to at least 90 and hip external rotation to at least 25 degrees Baseline: 80 and unable to assume test postures respectively   Goal status: Met 03/12/2024  4.  Improve left lower extremity strength as assessed by  MMT Baseline: See objective Goal status: INITIAL  LONG TERM GOALS: Target date: 04/23/2024  Improve patient-specific functional score to at least 6 Baseline: 3.75 Goal status: INITIAL  2.  Improve low back and left thigh pain to consistently 2/10 or better on the visual analog scale Baseline: 4/10 Goal status: INITIAL  3.  Improve lumbar extension AROM to 10 degrees Baseline: 0 degrees Goal status: INITIAL  4.  Improve left hip flexibility for hamstrings to at least 50 degrees and external rotation to at least 30 degrees Baseline: 40 and unable to assess respectively Goal status: INITIAL  5.  Improve left hip strength by at least 1 MMT grade for hip flexors and hip abductors Baseline: See objective Goal status: INITIAL  6.  Trip will be independent with his long-term maintenance home exercise program at discharge Baseline: Started 02/27/2024 Goal status: INITIAL   PLAN:  PT FREQUENCY: 1-2x/week  PT DURATION: 8 weeks  PLANNED INTERVENTIONS: 97750- Physical Performance Testing, 97110-Therapeutic exercises, 97530- Therapeutic activity, 97112- Neuromuscular re-education, 97535- Self Care, 02859- Manual therapy, [id number]- Gait training, [phone]- Traction (mechanical), [phone] (1-2 muscles), [phone] (3+ muscles)- Dry Needling, Patient/Family education, Stair training, Joint mobilization, Spinal mobilization, Cryotherapy, and Moist heat  PLAN FOR NEXT SESSION: Hip flexors, quadriceps, low back strength, balance, review posture and body mechanics basics.   Myer LELON Ivory, PT, MPT 03/12/2024, 4:19 PM

## 2024-03-13 ENCOUNTER — Telehealth: Payer: Self-pay | Admitting: Physical Medicine and Rehabilitation

## 2024-03-13 ENCOUNTER — Ambulatory Visit: Admitting: Rehabilitative and Restorative Service Providers"

## 2024-03-13 ENCOUNTER — Encounter: Payer: Self-pay | Admitting: Rehabilitative and Restorative Service Providers"

## 2024-03-13 DIAGNOSIS — M25652 Stiffness of left hip, not elsewhere classified: Secondary | ICD-10-CM

## 2024-03-13 DIAGNOSIS — R262 Difficulty in walking, not elsewhere classified: Secondary | ICD-10-CM | POA: Diagnosis not present

## 2024-03-13 DIAGNOSIS — R293 Abnormal posture: Secondary | ICD-10-CM | POA: Diagnosis not present

## 2024-03-13 DIAGNOSIS — M6281 Muscle weakness (generalized): Secondary | ICD-10-CM

## 2024-03-13 NOTE — Telephone Encounter (Signed)
 Pt came in and after therapy he said the therapist wants to know if he can get some imaging going on because he isn't progressing. CB#[phone]

## 2024-03-13 NOTE — Therapy (Signed)
 " OUTPATIENT PHYSICAL THERAPY LOWER EXTREMITY TREATMENT   Patient Name: [name] MRN: [id number] DOB:15-Mar-1951, 73 y.o., male Today's Date: 03/13/2024  END OF SESSION:  PT End of Session - 03/13/24 1345     Visit Number 4    Number of Visits 16    Date for Recertification  04/23/24    Authorization Type MEDICARE    PT Start Time 1345    PT Stop Time 1434    PT Time Calculation (min) 49 min    Activity Tolerance Patient tolerated treatment well;No increased pain;Patient limited by pain    Behavior During Therapy [facility] for tasks assessed/performed             Past Medical History:  Diagnosis Date   Adult attention deficit disorder    Benign prostatic hypertrophy    Bradycardia    Diverticulitis of colon    colonoscopy 07/11/2000   HTN (hypertension)    Nephrolithiasis    prostatitis with noncaseating granulomatous inflammation July 2000 negative IPPD.     Syncope    Past Surgical History:  Procedure Laterality Date   CYSTOSTOMY W/ BLADDER BIOPSY     from trigone   TONSILLECTOMY  03/26/1956   TRANSURETHRAL RESECTION OF PROSTATE     US  ECHOCARDIOGRAPHY  07/06/2008   Patient Active Problem List   Diagnosis Date Noted   GERD (gastroesophageal reflux disease) 05/13/2023   Urinary retention due to benign prostatic hyperplasia 07/12/2021   Cluster headache syndrome 11/10/2019   Drug-induced gout 01/23/2019   Bilateral high frequency sensorineural hearing loss 02/25/2018   Tremor 11/08/2016   Epistaxis 05/14/2016   Chronic renal disease, stage I 04/19/2016   Syncope 04/27/2015   Acute URI 01/11/2015   Hyperlipidemia LDL goal <130 10/18/2014   Health care maintenance 04/10/2013   PVC's (premature ventricular contractions) 05/24/2010   PAROXYSMAL ATRIAL FIBRILLATION 03/17/2010   SYNCOPE 07/21/2008   Attention deficit disorder 07/24/2007   Essential hypertension 07/24/2007   Diverticulosis 07/24/2007   BPH (benign prostatic hyperplasia) 07/24/2007     PCP: Glendia Freeman, MD  REFERRING PROVIDER: Gardiner Masters, MD  REFERRING DIAG: [id number] (ICD-10-CM) - Pain in left hip R10.32,G89.29 (ICD-10-CM) - Groin pain, chronic, left   THERAPY DIAG:  Stiffness of left hip, not elsewhere classified  Difficulty in walking, not elsewhere classified  Abnormal posture  Muscle weakness (generalized)  Rationale for Evaluation and Treatment: Rehabilitation  ONSET DATE: Summer of 2025 with moving  SUBJECTIVE:   SUBJECTIVE STATEMENT: HEP compliance remains a challenge.    Trip notes low back and left lower extremity pain dating back to moving over the summer.  He couldn't lift the left leg to get into the car initially.  Symptoms are proximal to the knee, have improved but still limit his function.  PERTINENT HISTORY: ADD, HTN, syncope, stage 1 renal disease, cardiac history   PAIN:  Are you having pain? Yes: NPRS scale: Groin 0/10; Left Thigh Constant 4/10 with movement; Low back 0-3/10 this week Pain location: Thigh pain with movement/strain; Low back soreness Pain description: See above Aggravating factors: Getting out of a chair, walking, bending Relieving factors: Tylenol , thigh is better with rest  PRECAUTIONS: None  RED FLAGS: None   WEIGHT BEARING RESTRICTIONS: No  FALLS:  Has patient fallen in last 6 months? No  LIVING ENVIRONMENT: Lives with: lives with their family and lives with their spouse Lives in: House/apartment Stairs: Avoids when possible Has following equipment at home: wife has stuff for Parkinsons  OCCUPATION: Retired  lawyer   PLOF: Independent  PATIENT GOALS: Walk 1/2 mile or more without pain, easier out of a chair, lift with good mechanics  NEXT MD VISIT: NA  OBJECTIVE:  Note: Objective measures were completed at Evaluation unless otherwise noted.  DIAGNOSTIC FINDINGS: 2 view radiographs of pelvis and hips show mild degenerative change to  left hip, there is joint spacing noted superiorly.  No fractures noted.  PATIENT SURVEYS:  PSFS: THE PATIENT SPECIFIC FUNCTIONAL SCALE  Place score of 0-10 (0 = unable to perform activity and 10 = able to perform activity at the same level as before injury or problem)  Activity Date: 02/27/2024    Walk the dog 5    2.  Move without pain 2    3.  Bend/stoop 5    4.  Perform leg exercises 3    Total Score 3.75      Total Score = Sum of activity scores/number of activities  Minimally Detectable Change: 3 points (for single activity); 2 points (for average score)  [facility] [employer] [facility] (nd). The Patient Specific Functional Scale . Retrieved from [email or web address]   COGNITION: Overall cognitive status: Within functional limits for tasks assessed     SENSATION: [name] notes left anterior thigh symptoms as distal as the knee   MUSCLE LENGTH: 03/06/2024 Hamstrings: Right 40 deg; Left 40 deg  02/27/2024 Hamstrings: Right 35 deg; Left 40 deg  POSTURE: rounded shoulders, forward head, and decreased lumbar lordosis   LOWER EXTREMITY ROM:  Passive ROM Left/Right 02/27/2024 Left/Right 03/06/2024 Left/Right 03/12/2024  Hip flexion 80/100 90/NT 90/100  Hip extension     Hip abduction     Hip adduction     Hip internal rotation NT/10 -7/NT -6/11  Hip external rotation NT/26 34/NT 33/34  Knee flexion     Knee extension     Ankle dorsiflexion     Ankle plantarflexion     Ankle inversion     Ankle eversion     Trunk extension 0    Hamstrings   35/35   (Blank rows = not tested)  LOWER EXTREMITY MMT:  MMT Left/Right 02/27/2024   Hip flexion 3/5   Hip extension    Hip abduction 3/5   Hip adduction    Hip internal rotation    Hip external rotation    Knee flexion    Knee extension 4-/4+   Ankle dorsiflexion    Ankle plantarflexion    Ankle inversion    Ankle eversion     (Blank rows = not tested)  GAIT: Distance walked: 100 feet Assistive device utilized:  None Level of assistance: Complete Independence Comments: [name] has a noticeable limp when walking                                                                                                                                TREATMENT DATE:  03/13/2024 Single knee-to-chest stretch with opposite leg straight 4 x 20  seconds Supine figure 4 stretch 4 x 20 seconds Supine hamstrings stretch with other leg straight 4 x 20 seconds Knee to opposite shoulder with opposite leg straight 4 x 20 seconds Side lie clams with Black Thera-Band resistance 10 x 3 seconds each side with slow eccentrics Yoga Bridge 10 x 5 seconds Standing lumbar extension AROM 10 x 3 seconds Seated straight leg raises 2 sets of 5 with slow eccentrics and emphasis on avoiding quadriceps lag Supine straight leg raises 2 sets of 5 for 3 seconds and slow eccentrics, focus on maintaining a straight knee  Neuromuscular re-education: Tandem balance 4 x 20 seconds each eyes open; head turning and eyes closed  Functional Activities: Step down and hike 10 x bilateral   03/12/2024 Single knee-to-chest stretch with opposite leg straight 4 x 20 seconds Supine figure 4 stretch 4 x 20 seconds Supine hamstrings stretch with other leg straight 4 x 20 seconds Knee to opposite shoulder with opposite leg straight 4 x 20 seconds Side lie clams with Green Thera-Band resistance 10 x 3 seconds each side with slow eccentrics Yoga Bridge 10 x 5 seconds Standing lumbar extension AROM 10 x 3 seconds Supine straight leg raises 2 sets of 5 for 3 seconds and slow eccentrics, focus on maintaining a straight knee  Neuromuscular re-education: Discussed tandem balance  02464: Briefly reviewed practical bed mobility (log roll); reviewed the importance of avoiding flexion and flexion with rotation; reviewed measurements today and changes to his home exercise program     03/06/2024 Single knee-to-chest stretch with opposite leg straight 5 x 20  seconds Supine figure 4 stretch 5 x 20 seconds Supine hamstrings stretch with other leg straight 4 x 20 seconds Knee to opposite shoulder with opposite leg straight 4 x 20 seconds Side lie clams with Black Thera-Band resistance 10 x 3 seconds each side with slow eccentrics Yoga Bridge 10 x 5 seconds Standing lumbar extension AROM 10 x 3 seconds  Neuromuscular re-education: Tandem balance 4 x 20 seconds  97535: Reviewed practical bed mobility (log roll); reviewed the importance of avoiding flexion and flexion with rotation; reviewed day 1 and progressions to his home exercise program   PATIENT EDUCATION:  Education details: See above Person educated: Patient Education method: Explanation, Demonstration, Tactile cues, Verbal cues, and Handouts Education comprehension: verbalized understanding, returned demonstration, verbal cues required, tactile cues required, and needs further education  HOME EXERCISE PROGRAM: Access Code: [email or web address] URL: [email or web address] Date: 03/13/2024 Prepared by: Lamar Ivory  Exercises - Single Knee to Chest Stretch  - 1 x daily - 7 x weekly - 1 sets - 5 reps - 20 seconds hold - Supine Figure 4 Piriformis Stretch  - 1 x daily - 7 x weekly - 1 sets - 5 reps - 20 seconds hold - Standing Lumbar Extension at Wall - Forearms  - 5 x daily - 7 x weekly - 1 sets - 5 reps - 3 seconds hold - Tandem Stance  - 2 x daily - 7 x weekly - 1 sets - 10 reps - 20 second hold - Supine Hamstring Stretch  - 1 x daily - 7 x weekly - 1 sets - 5 reps - 20 seconds hold - Supine Gluteus Stretch  - 1 x daily - 7 x weekly - 1 sets - 5 reps - 20 seconds hold - Yoga Bridge  - 2 x daily - 7 x weekly - 2 sets - 10 reps - 5 seconds hold - Clamshell with Resistance  - 2  x daily - 7 x weekly - 2 sets - 10 reps - 3 seconds hold - Small Range Straight Leg Raise  - 2 x daily - 7 x weekly - 2 sets - 5 reps - 3 seconds hold - Seated Straight Leg Raise   - 2 x daily - 7 x weekly -  2 sets - 5 reps - 2 seconds hold  ASSESSMENT:  CLINICAL IMPRESSION: Trip did a good job with strength and functional progressions today.  We discussed an increased emphasis on strength and stability, while still doing enough flexibility work to address impairments noted at evaluation.  Trip's left hip is noticeably weaker, stiffer and therefore more painful than his right and this is being addressed with his current rehabilitation.  Continued progress with HEP compliance will also assist in meeting long-term goals.  Patient is a 73 y.o. male who was seen today for physical therapy evaluation and treatment for M25.552 (ICD-10-CM) - Pain in left hip R10.32,G89.29 (ICD-10-CM) - Groin pain, chronic, left.  Trip notes some subjective progress since his left groin injection, although he still has weakness, difficulty getting out of the car and gait impairments.  Significant clinical findings include postural abnormalities, limited lumbar extension AROM, limited left hip active range of motion and left hip weakness, particularly with hip flexors and abductors.  Trip will also benefit from low back strength and body mechanics work to address all impairments noted during today's evaluation.  OBJECTIVE IMPAIRMENTS: Abnormal gait, decreased activity tolerance, decreased endurance, decreased knowledge of condition, difficulty walking, decreased ROM, decreased strength, decreased safety awareness, impaired perceived functional ability, impaired flexibility, improper body mechanics, postural dysfunction, and pain.   ACTIVITY LIMITATIONS: carrying, lifting, bending, stairs, and locomotion level  PARTICIPATION LIMITATIONS: community activity  PERSONAL FACTORS: ADD, HTN, syncope, stage 1 renal disease, cardiac history are also affecting patient's functional outcome.    REHAB POTENTIAL: Good  CLINICAL DECISION MAKING: Evolving/moderate complexity  EVALUATION COMPLEXITY: Moderate   GOALS: Goals reviewed with  patient? Yes  SHORT TERM GOALS: Target date: 03/26/2024 Trip will be independent with his day 1 home exercise program Baseline: Started 02/27/2024 Goal status: Ongoing 03/12/2024  2.  Lumbar extension AROM to at least 5 degrees Baseline: 0 degrees Goal status: INITIAL  3.  Improve left hip active range of motion for flexion to at least 90 and hip external rotation to at least 25 degrees Baseline: 80 and unable to assume test postures respectively   Goal status: Met 03/12/2024  4.  Improve left lower extremity strength as assessed by MMT Baseline: See objective Goal status: INITIAL  LONG TERM GOALS: Target date: 04/23/2024  Improve patient-specific functional score to at least 6 Baseline: 3.75 Goal status: INITIAL  2.  Improve low back and left thigh pain to consistently 2/10 or better on the visual analog scale Baseline: 4/10 Goal status: INITIAL  3.  Improve lumbar extension AROM to 10 degrees Baseline: 0 degrees Goal status: INITIAL  4.  Improve left hip flexibility for hamstrings to at least 50 degrees and external rotation to at least 30 degrees Baseline: 40 and unable to assess respectively Goal status: INITIAL  5.  Improve left hip strength by at least 1 MMT grade for hip flexors and hip abductors Baseline: See objective Goal status: INITIAL  6.  Trip will be independent with his long-term maintenance home exercise program at discharge Baseline: Started 02/27/2024 Goal status: INITIAL   PLAN:  PT FREQUENCY: 1-2x/week  PT DURATION: 8 weeks  PLANNED INTERVENTIONS: 97750-  Physical Performance Testing, 97110-Therapeutic exercises, 97530- Therapeutic activity, [id number]- Neuromuscular re-education, 97535- Self Care, [id number]- Manual therapy, [phone]- Gait training, [phone]- Traction (mechanical), [phone] (1-2 muscles), [phone] (3+ muscles)- Dry Needling, Patient/Family education, Stair training, Joint mobilization, Spinal mobilization, Cryotherapy, and Moist heat  PLAN FOR NEXT  SESSION: Hip flexors, quadriceps, low back strength, balance, review posture and body mechanics basics.  How is the posterior right knee pain?  How has home exercise program participation been?   Myer LELON Ivory, PT, MPT 03/13/2024, 4:35 PM  "

## 2024-03-17 ENCOUNTER — Encounter: Payer: Self-pay | Admitting: Rehabilitative and Restorative Service Providers"

## 2024-03-17 ENCOUNTER — Ambulatory Visit (INDEPENDENT_AMBULATORY_CARE_PROVIDER_SITE_OTHER): Admitting: Rehabilitative and Restorative Service Providers"

## 2024-03-17 DIAGNOSIS — M6281 Muscle weakness (generalized): Secondary | ICD-10-CM

## 2024-03-17 DIAGNOSIS — R262 Difficulty in walking, not elsewhere classified: Secondary | ICD-10-CM

## 2024-03-17 DIAGNOSIS — R293 Abnormal posture: Secondary | ICD-10-CM

## 2024-03-17 DIAGNOSIS — M25652 Stiffness of left hip, not elsewhere classified: Secondary | ICD-10-CM

## 2024-03-17 NOTE — Therapy (Signed)
 " OUTPATIENT PHYSICAL THERAPY LOWER EXTREMITY TREATMENT/PROGRESS NOTE  Progress Note Reporting Period 02/27/2024 to 03/17/2024  See note below for Objective Data and Assessment of Progress/Goals.     Patient Name: ONTARIO PETTENGILL MRN: [id number] DOB:1950/08/25, 73 y.o., male Today's Date: 03/17/2024  END OF SESSION:  PT End of Session - 03/17/24 1346     Visit Number 5    Number of Visits 16    Date for Recertification  04/23/24    Authorization Type MEDICARE    PT Start Time 1345    PT Stop Time 1440    PT Time Calculation (min) 55 min    Activity Tolerance Patient tolerated treatment well;No increased pain;Patient limited by pain    Behavior During Therapy [facility] for tasks assessed/performed              Past Medical History:  Diagnosis Date   Adult attention deficit disorder    Benign prostatic hypertrophy    Bradycardia    Diverticulitis of colon    colonoscopy 07/11/2000   HTN (hypertension)    Nephrolithiasis    prostatitis with noncaseating granulomatous inflammation July 2000 negative IPPD.     Syncope    Past Surgical History:  Procedure Laterality Date   CYSTOSTOMY W/ BLADDER BIOPSY     from trigone   TONSILLECTOMY  03/26/1956   TRANSURETHRAL RESECTION OF PROSTATE     US  ECHOCARDIOGRAPHY  07/06/2008   Patient Active Problem List   Diagnosis Date Noted   GERD (gastroesophageal reflux disease) 05/13/2023   Urinary retention due to benign prostatic hyperplasia 07/12/2021   Cluster headache syndrome 11/10/2019   Drug-induced gout 01/23/2019   Bilateral high frequency sensorineural hearing loss 02/25/2018   Tremor 11/08/2016   Epistaxis 05/14/2016   Chronic renal disease, stage I 04/19/2016   Syncope 04/27/2015   Acute URI 01/11/2015   Hyperlipidemia LDL goal <130 10/18/2014   Health care maintenance 04/10/2013   PVC's (premature ventricular contractions) 05/24/2010   PAROXYSMAL ATRIAL FIBRILLATION 03/17/2010   SYNCOPE 07/21/2008   Attention  deficit disorder 07/24/2007   Essential hypertension 07/24/2007   Diverticulosis 07/24/2007   BPH (benign prostatic hyperplasia) 07/24/2007    PCP: Glendia Freeman, MD  REFERRING PROVIDER: Gardiner Masters, MD  REFERRING DIAG: [phone] (ICD-10-CM) - Pain in left hip R10.32,G89.29 (ICD-10-CM) - Groin pain, chronic, left   THERAPY DIAG:  Stiffness of left hip, not elsewhere classified  Difficulty in walking, not elsewhere classified  Abnormal posture  Muscle weakness (generalized)  Rationale for Evaluation and Treatment: Rehabilitation  ONSET DATE: Summer of 2025 with moving  SUBJECTIVE:   SUBJECTIVE STATEMENT: HEP compliance remains inconsistent.  Left gluteal and anterior thigh symptoms are most evident and limit weight-bearing function.   Trip notes low back and left lower extremity pain dating back to moving over the summer.  He couldn't lift the left leg to get into the car initially.  Symptoms are proximal to the knee, have improved but still limit his function.  PERTINENT HISTORY: ADD, HTN, syncope, stage 1 renal disease, cardiac history   PAIN:  Are you having pain? Yes: NPRS scale: Groin 0/10; Left Thigh Constant 4/10 with movement; Low back 0-3/10 this week Pain location: Thigh pain with movement/strain; Low back soreness Pain description: See above Aggravating factors: Getting out of a chair, walking, bending Relieving factors: Tylenol , thigh is better with rest  PRECAUTIONS: None  RED FLAGS: None   WEIGHT BEARING RESTRICTIONS: No  FALLS:  Has patient fallen in last 6 months?  No  LIVING ENVIRONMENT: Lives with: lives with their family and lives with their spouse Lives in: House/apartment Stairs: Avoids when possible Has following equipment at home: wife has stuff for Parkinsons  OCCUPATION: Retired clinical research associate   PLOF: Independent  PATIENT GOALS: Walk 1/2 mile or more without pain, easier out of a chair, lift with good mechanics.    NEXT MD VISIT:  NA  OBJECTIVE:  Note: Objective measures were completed at Evaluation unless otherwise noted.  DIAGNOSTIC FINDINGS: 2 view radiographs of pelvis and hips show mild degenerative change to  left hip, there is joint spacing noted superiorly. No fractures noted.  PATIENT SURVEYS:  PSFS: THE PATIENT SPECIFIC FUNCTIONAL SCALE  Place score of 0-10 (0 = unable to perform activity and 10 = able to perform activity at the same level as before injury or problem)  Activity Date: 02/27/2024 03/17/2024   Walk the dog 5 5   2.  Move without pain 2 2   3.  Bend/stoop 5 5   4.  Perform leg exercises 3 3   Total Score 3.75 3.75     Total Score = Sum of activity scores/number of activities  Minimally Detectable Change: 3 points (for single activity); 2 points (for average score)  [employer] (nd). The Patient Specific Functional Scale . Retrieved from [email or web address]   COGNITION: Overall cognitive status: Within functional limits for tasks assessed     SENSATION: [name] notes left anterior thigh symptoms as distal as the knee   MUSCLE LENGTH: 03/17/2024 Hamstrings: Right 40 deg; Left 40 deg  03/06/2024 Hamstrings: Right 40 deg; Left 40 deg  02/27/2024 Hamstrings: Right 35 deg; Left 40 deg  POSTURE: rounded shoulders, forward head, and decreased lumbar lordosis   LOWER EXTREMITY ROM:  Passive ROM Left/Right 02/27/2024 Left/Right 03/06/2024 Left/Right 03/12/2024 Left/Right 03/17/2024  Hip flexion 80/100 90/NT 90/100 95/100  Hip extension      Hip abduction      Hip adduction      Hip internal rotation NT/10 -7/NT -6/11 -4/13  Hip external rotation NT/26 34/NT 33/34 37/40  Knee flexion      Knee extension      Ankle dorsiflexion      Ankle plantarflexion      Ankle inversion      Ankle eversion      Trunk extension 0     Hamstrings   35/35 35/35   (Blank rows = not tested)  LOWER EXTREMITY MMT:  MMT  Left/Right 02/27/2024 Left 03/17/2024  Hip flexion 3/5 3+  Hip extension    Hip abduction 3/5 3/5  Hip adduction    Hip internal rotation    Hip external rotation    Knee flexion    Knee extension 4-/4+ 4-/5  Ankle dorsiflexion    Ankle plantarflexion    Ankle inversion    Ankle eversion     (Blank rows = not tested)  GAIT: Distance walked: 100 feet Assistive device utilized: None Level of assistance: Complete Independence Comments: [name] has a noticeable limp when walking  TREATMENT DATE:  03/17/2024 Single knee-to-chest stretch with opposite leg straight 4 x 20 seconds Supine figure 4 stretch 4 x 20 seconds Supine hamstrings stretch with other leg straight 4 x 20 seconds Knee to opposite shoulder with opposite leg straight 4 x 20 seconds Side lie clams with Black Thera-Band resistance 2 sets of 10 x 3 seconds lie right, lift left with slow eccentrics Yoga Bridge 10 x 5 seconds Standing lumbar extension AROM 10 x 3 seconds Seated straight leg raises 2 sets of 5 with slow eccentrics and emphasis on avoiding quadriceps lag  Neuromuscular re-education: Tandem balance 4 x 20 seconds each eyes open; head turning and eyes closed   03/13/2024 Single knee-to-chest stretch with opposite leg straight 4 x 20 seconds Supine figure 4 stretch 4 x 20 seconds Supine hamstrings stretch with other leg straight 4 x 20 seconds Knee to opposite shoulder with opposite leg straight 4 x 20 seconds Side lie clams with Black Thera-Band resistance 10 x 3 seconds each side with slow eccentrics Yoga Bridge 10 x 5 seconds Standing lumbar extension AROM 10 x 3 seconds Seated straight leg raises 2 sets of 5 with slow eccentrics and emphasis on avoiding quadriceps lag Supine straight leg raises 2 sets of 5 for 3 seconds and slow eccentrics, focus on maintaining a straight  knee  Neuromuscular re-education: Tandem balance 4 x 20 seconds each eyes open; head turning and eyes closed  Functional Activities: Step down and hike 10 x bilateral   03/12/2024 Single knee-to-chest stretch with opposite leg straight 4 x 20 seconds Supine figure 4 stretch 4 x 20 seconds Supine hamstrings stretch with other leg straight 4 x 20 seconds Knee to opposite shoulder with opposite leg straight 4 x 20 seconds Side lie clams with Green Thera-Band resistance 10 x 3 seconds each side with slow eccentrics Yoga Bridge 10 x 5 seconds Standing lumbar extension AROM 10 x 3 seconds Supine straight leg raises 2 sets of 5 for 3 seconds and slow eccentrics, focus on maintaining a straight knee  Neuromuscular re-education: Discussed tandem balance  02464: Briefly reviewed practical bed mobility (log roll); reviewed the importance of avoiding flexion and flexion with rotation; reviewed measurements today and changes to his home exercise program      PATIENT EDUCATION:  Education details: See above Person educated: Patient Education method: Explanation, Demonstration, Tactile cues, Verbal cues, and Handouts Education comprehension: verbalized understanding, returned demonstration, verbal cues required, tactile cues required, and needs further education  HOME EXERCISE PROGRAM: Access Code: [email or web address] URL: [email or web address] Date: 03/13/2024 Prepared by: Lamar Ivory  Exercises - Single Knee to Chest Stretch  - 1 x daily - 7 x weekly - 1 sets - 5 reps - 20 seconds hold - Supine Figure 4 Piriformis Stretch  - 1 x daily - 7 x weekly - 1 sets - 5 reps - 20 seconds hold - Standing Lumbar Extension at Wall - Forearms  - 5 x daily - 7 x weekly - 1 sets - 5 reps - 3 seconds hold - Tandem Stance  - 2 x daily - 7 x weekly - 1 sets - 10 reps - 20 second hold - Supine Hamstring Stretch  - 1 x daily - 7 x weekly - 1 sets - 5 reps - 20 seconds hold - Supine Gluteus Stretch  -  1 x daily - 7 x weekly - 1 sets - 5 reps - 20 seconds hold - Yoga Bridge  - 2 x daily - 7 x  weekly - 2 sets - 10 reps - 5 seconds hold - Clamshell with Resistance  - 2 x daily - 7 x weekly - 2 sets - 10 reps - 3 seconds hold - Small Range Straight Leg Raise  - 2 x daily - 7 x weekly - 2 sets - 5 reps - 3 seconds hold - Seated Straight Leg Raise   - 2 x daily - 7 x weekly - 2 sets - 5 reps - 2 seconds hold  ASSESSMENT:  CLINICAL IMPRESSION: Although symptoms are similar to evaluation < 3 weeks ago, Trip is moving better with his hip flexion and with quadriceps strengthening activities.  His left hip active range of motion has improved, although left hip flexion and internal rotation remain quite limited.  This is consistent with left hip osteoarthritis.  Strength has shown objective progress, although this will benefit from more consistent HEP participation.  Trip would like to get additional imaging for his spine and possibly hip and I encouraged him to speak with Dr. Eldonna about this.  However, I made it clear to Trip that HEP participation will need to increase to meet long-term physical therapy goals.  Patient is a 73 y.o. male who was seen today for physical therapy evaluation and treatment for M25.552 (ICD-10-CM) - Pain in left hip R10.32,G89.29 (ICD-10-CM) - Groin pain, chronic, left.  Trip notes some subjective progress since his left groin injection, although he still has weakness, difficulty getting out of the car and gait impairments.  Significant clinical findings include postural abnormalities, limited lumbar extension AROM, limited left hip active range of motion and left hip weakness, particularly with hip flexors and abductors.  Trip will also benefit from low back strength and body mechanics work to address all impairments noted during today's evaluation.  OBJECTIVE IMPAIRMENTS: Abnormal gait, decreased activity tolerance, decreased endurance, decreased knowledge of condition,  difficulty walking, decreased ROM, decreased strength, decreased safety awareness, impaired perceived functional ability, impaired flexibility, improper body mechanics, postural dysfunction, and pain.   ACTIVITY LIMITATIONS: carrying, lifting, bending, stairs, and locomotion level  PARTICIPATION LIMITATIONS: community activity  PERSONAL FACTORS: ADD, HTN, syncope, stage 1 renal disease, cardiac history are also affecting patient's functional outcome.    REHAB POTENTIAL: Good  CLINICAL DECISION MAKING: Evolving/moderate complexity  EVALUATION COMPLEXITY: Moderate   GOALS: Goals reviewed with patient? Yes  SHORT TERM GOALS: Target date: 03/26/2024 Trip will be independent with his day 1 home exercise program Baseline: Started 02/27/2024 Goal status: Partially Met 03/17/2024  2.  Lumbar extension AROM to at least 5 degrees Baseline: 0 degrees Goal status: Ongoing 03/17/2024  3.  Improve left hip active range of motion for flexion to at least 90 and hip external rotation to at least 25 degrees Baseline: 80 and unable to assume test postures respectively   Goal status: Met 03/12/2024  4.  Improve left lower extremity strength as assessed by MMT Baseline: See objective Goal status: Met 03/17/2024  LONG TERM GOALS: Target date: 04/23/2024  Improve patient-specific functional score to at least 6 Baseline: 3.75 Goal status: Ongoing 03/17/2024  2.  Improve low back and left thigh pain to consistently 2/10 or better on the visual analog scale Baseline: 4/10 Goal status: Ongoing 03/17/2024  3.  Improve lumbar extension AROM to 10 degrees Baseline: 0 degrees Goal status: Ongoing 03/17/2024  4.  Improve left hip flexibility for hamstrings to at least 50 degrees and external rotation to at least 30 degrees Baseline: 40 and unable to assess respectively Goal  status: Partially Met 03/17/2024  5.  Improve left hip strength by at least 1 MMT grade for hip flexors and hip  abductors Baseline: See objective Goal status: Ongoing 03/17/2024  6.  Trip will be independent with his long-term maintenance home exercise program at discharge Baseline: Started 02/27/2024 Goal status: Ongoing 03/17/2024   PLAN:  PT FREQUENCY: 1-2x/week  PT DURATION: 6 weeks  PLANNED INTERVENTIONS: 97750- Physical Performance Testing, 97110-Therapeutic exercises, 97530- Therapeutic activity, 97112- Neuromuscular re-education, 97535- Self Care, 02859- Manual therapy, [id number]- Gait training, [phone]- Traction (mechanical), [id number] (1-2 muscles), 20561 (3+ muscles)- Dry Needling, Patient/Family education, Stair training, Joint mobilization, Spinal mobilization, Cryotherapy, and Moist heat  PLAN FOR NEXT SESSION: How has home exercise program participation been?  Trip has left hip OA and likely lumbar nerve root irritation.  Avoid posterior lumbar tilting, spine flexion while working on low back, hip abductors and quadriceps strength.   Myer LELON Ivory, PT, MPT 03/17/2024, 2:49 PM  "

## 2024-03-23 ENCOUNTER — Encounter: Payer: Self-pay | Admitting: Physical Therapy

## 2024-03-23 ENCOUNTER — Ambulatory Visit: Admitting: Physical Therapy

## 2024-03-23 ENCOUNTER — Ambulatory Visit: Attending: Internal Medicine | Admitting: Internal Medicine

## 2024-03-23 ENCOUNTER — Encounter: Payer: Self-pay | Admitting: Internal Medicine

## 2024-03-23 VITALS — BP 126/88 | HR 64 | Ht 75.0 in | Wt 206.0 lb

## 2024-03-23 DIAGNOSIS — M6281 Muscle weakness (generalized): Secondary | ICD-10-CM | POA: Diagnosis not present

## 2024-03-23 DIAGNOSIS — R262 Difficulty in walking, not elsewhere classified: Secondary | ICD-10-CM | POA: Diagnosis not present

## 2024-03-23 DIAGNOSIS — I4891 Unspecified atrial fibrillation: Secondary | ICD-10-CM | POA: Diagnosis not present

## 2024-03-23 DIAGNOSIS — R293 Abnormal posture: Secondary | ICD-10-CM

## 2024-03-23 DIAGNOSIS — M25652 Stiffness of left hip, not elsewhere classified: Secondary | ICD-10-CM | POA: Diagnosis not present

## 2024-03-23 NOTE — Patient Instructions (Signed)

## 2024-03-23 NOTE — Progress Notes (Signed)
 "     HPI [name] returns today for followup of syncope. He is a pleasant 73 yo man with a h/o syncope. He has not passed out in several years. He denies chest pain or sob and has no limit in his activity.  He has not had syncope. He has lost weight and is more active now that he has partially retired.  Allergies[1]   Current Outpatient Medications  Medication Sig Dispense Refill   acetaminophen  (TYLENOL ) 500 MG tablet Take 1,000 mg by mouth every 6 (six) hours as needed for mild pain.     allopurinol  (ZYLOPRIM ) 300 MG tablet Take 1 tablet (300 mg total) by mouth daily. 90 tablet 3   amLODipine  (NORVASC ) 5 MG tablet      aspirin  EC 81 MG tablet Take 1 tablet (81 mg total) by mouth daily.     cholecalciferol (VITAMIN D3) 25 MCG (1000 UNIT) tablet Take 1,000 Units by mouth daily.     escitalopram (LEXAPRO) 10 MG tablet Take 10 mg by mouth daily.     fluorouracil (EFUDEX) 5 % cream Apply 1 Application topically 2 (two) times daily.     guanFACINE  (TENEX ) 1 MG tablet TAKE ONE AND ONE-HALF TABLETS AT BEDTIME 135 tablet 3   ibuprofen  (ADVIL ) 200 MG tablet Take 600-800 mg by mouth every 6 (six) hours as needed for moderate pain.     metoprolol  succinate (TOPROL -XL) 25 MG 24 hr tablet 25 mg every morning.     pantoprazole  (PROTONIX ) 20 MG tablet Take 1 tablet (20 mg total) by mouth daily before breakfast. 90 tablet 3   sildenafil (REVATIO) 20 MG tablet Take 20 mg by mouth daily as needed.      spironolactone (ALDACTONE) 25 MG tablet Take by mouth.     telmisartan (MICARDIS) 80 MG tablet Take 80 mg by mouth daily.     No current facility-administered medications for this visit.     Past Medical History:  Diagnosis Date   Adult attention deficit disorder    Benign prostatic hypertrophy    Bradycardia    Diverticulitis of colon    colonoscopy 07/11/2000   HTN (hypertension)    Nephrolithiasis    prostatitis with noncaseating granulomatous inflammation July 2000 negative IPPD.      Syncope     ROS:   All systems reviewed and negative except as noted in the HPI.   Past Surgical History:  Procedure Laterality Date   CYSTOSTOMY W/ BLADDER BIOPSY     from trigone   TONSILLECTOMY  03/26/1956   TRANSURETHRAL RESECTION OF PROSTATE     US  ECHOCARDIOGRAPHY  07/06/2008     Family History  Problem Relation Age of Onset   Heart disease Mother 29       IHD with CABG   Kidney failure Father    Hypertension Father    Lung cancer Sister    Ehlers-Danlos syndrome Child    Colon cancer Neg Hx    Esophageal cancer Neg Hx    Rectal cancer Neg Hx    Stomach cancer Neg Hx    Colon polyps Neg Hx      Social History   Socioeconomic History   Marital status: Married    Spouse name: Not on file   Number of children: Not on file   Years of education: Not on file   Highest education level: Not on file  Occupational History   Occupation: Clinical Research Associate    Employer: [employer]  Tobacco Use  Smoking status: Never   Smokeless tobacco: Never  Vaping Use   Vaping status: Never Used  Substance and Sexual Activity   Alcohol use: Yes    Alcohol/week: 3.0 standard drinks of alcohol    Types: 3 Glasses of wine per week   Drug use: No   Sexual activity: Not on file  Other Topics Concern   Not on file  Social History Narrative   Not on file   Social Drivers of Health   Tobacco Use: Low Risk (03/23/2024)   Patient History    Smoking Tobacco Use: Never    Smokeless Tobacco Use: Never    Passive Exposure: Not on file  Financial Resource Strain: Not on file  Food Insecurity: Low Risk (10/21/2023)   Received from [facility]   Epic    Within the past 12 months, you worried that your food would run out before you got money to buy more: Never true    Within the past 12 months, the food you bought just didn't last and you didn't have money to get more. : Never true  Transportation Needs: No Transportation Needs (10/21/2023)   Received from [employer] [facility]    In the past 12 months, has lack of reliable transportation kept you from medical appointments, meetings, work or from getting things needed for daily living? : No  Physical Activity: Not on file  Stress: Not on file  Social Connections: Unknown (08/06/2021)   Received from [facility]   Social Network    Social Network: Not on file  Intimate Partner Violence: Unknown (06/28/2021)   Received from [facility]   HITS    Physically Hurt: Not on file    Insult or Talk Down To: Not on file    Threaten Physical Harm: Not on file    Scream or Curse: Not on file  Depression (EYV7-0): Not on file  Alcohol Screen: Not on file  Housing: Low Risk (10/21/2023)   Received from [facility]   Epic    What is your living situation today?: I have a steady place to live    Think about the place you live. Do you have problems with any of the following? Choose all that apply:: None/None on this list  Utilities: Low Risk (10/21/2023)   Received from [facility]   Utilities    In the past 12 months has the electric, gas, oil, or water company threatened to shut off services in your home? : No  Health Literacy: Not on file     BP 126/88   Pulse 64   Ht 6' 3 (1.905 m)   Wt 206 lb (93.4 kg)   SpO2 98%   BMI 25.75 kg/m   Physical Exam:  Well appearing NAD HEENT: Unremarkable Neck:  No JVD, no thyromegally Lymphatics:  No adenopathy Back:  No CVA tenderness Lungs:  Clear HEART:  Regular rate rhythm, no murmurs, no rubs, no clicks Abd:  soft, positive bowel sounds, no organomegally, no rebound, no guarding Ext:  2 plus pulses, no edema, no cyanosis, no clubbing Skin:  No rashes no nodules Neuro:  CN II through XII intact, motor grossly intact  EKG - nsr with PVC's   Assess/Plan:   1.Syncope - he has not had any recurrent episodes. He will undergo watchful waiting. I suspect that the etiology of his syncope is due to autonomic dysfunction. 2. Palpitations - he has  minimal symptoms. He has purchased an [employer] watch but has  not documented any arrhythmias 3. HTN - his bp is controlled today and he will continue his current meds. 4. PVC's - his symptoms are well controlled. No change in his meds.   Danelle Trinady Milewski,MD     [1]  Allergies Allergen Reactions   Amoxicillin Rash   "

## 2024-03-23 NOTE — Therapy (Signed)
 " OUTPATIENT PHYSICAL THERAPY LOWER EXTREMITY TREATMENT/PROGRESS NOTE  Progress Note Reporting Period 02/27/2024 to 03/17/2024  See note below for Objective Data and Assessment of Progress/Goals.     Patient Name: [name] MRN: [id number] DOB:[birth year], 73 y.o., male Today's Date: 03/23/2024  END OF SESSION:  PT End of Session - 03/23/24 1530     Visit Number 6    Number of Visits 16    Date for Recertification  04/23/24    Authorization Type MEDICARE    Progress Note Due on Visit 10    PT Start Time 1435    PT Stop Time 1515    PT Time Calculation (min) 40 min    Activity Tolerance Patient tolerated treatment well    Behavior During Therapy [facility] for tasks assessed/performed               Past Medical History:  Diagnosis Date   Adult attention deficit disorder    Benign prostatic hypertrophy    Bradycardia    Diverticulitis of colon    colonoscopy 07/11/2000   HTN (hypertension)    Nephrolithiasis    prostatitis with noncaseating granulomatous inflammation July 2000 negative IPPD.     Syncope    Past Surgical History:  Procedure Laterality Date   CYSTOSTOMY W/ BLADDER BIOPSY     from trigone   TONSILLECTOMY  03/26/1956   TRANSURETHRAL RESECTION OF PROSTATE     US  ECHOCARDIOGRAPHY  07/06/2008   Patient Active Problem List   Diagnosis Date Noted   GERD (gastroesophageal reflux disease) 05/13/2023   Urinary retention due to benign prostatic hyperplasia 07/12/2021   Cluster headache syndrome 11/10/2019   Drug-induced gout 01/23/2019   Bilateral high frequency sensorineural hearing loss 02/25/2018   Tremor 11/08/2016   Epistaxis 05/14/2016   Chronic renal disease, stage I 04/19/2016   Syncope 04/27/2015   Acute URI 01/11/2015   Hyperlipidemia LDL goal <130 10/18/2014   Health care maintenance 04/10/2013   PVC's (premature ventricular contractions) 05/24/2010   PAROXYSMAL ATRIAL FIBRILLATION 03/17/2010   SYNCOPE 07/21/2008   Attention deficit  disorder 07/24/2007   Essential hypertension 07/24/2007   Diverticulosis 07/24/2007   BPH (benign prostatic hyperplasia) 07/24/2007    PCP: Glendia Freeman, MD  REFERRING PROVIDER: Gardiner Masters, MD  REFERRING DIAG: [phone] (ICD-10-CM) - Pain in left hip R10.32,G89.29 (ICD-10-CM) - Groin pain, chronic, left   THERAPY DIAG:  Stiffness of left hip, not elsewhere classified  Difficulty in walking, not elsewhere classified  Abnormal posture  Muscle weakness (generalized)  Rationale for Evaluation and Treatment: Rehabilitation  ONSET DATE: Summer of 2025 with moving  SUBJECTIVE:   SUBJECTIVE STATEMENT: Pt arriving today reporting more weakness almost feels like his knee is going to buckle in his left thigh with associated 3/10.    PERTINENT HISTORY: ADD, HTN, syncope, stage 1 renal disease, cardiac history   PAIN:  Are you having pain? Yes: NPRS scale: 3/10 Pain location: Thigh pain with movement/strain; Low back soreness Pain description: See above Aggravating factors: Getting out of a chair, walking, bending Relieving factors: Tylenol , thigh is better with rest  PRECAUTIONS: None  RED FLAGS: None   WEIGHT BEARING RESTRICTIONS: No  FALLS:  Has patient fallen in last 6 months? No  LIVING ENVIRONMENT: Lives with: lives with their family and lives with their spouse Lives in: House/apartment Stairs: Avoids when possible Has following equipment at home: wife has stuff for Parkinsons  OCCUPATION: Retired clinical research associate   PLOF: Independent  PATIENT GOALS: Walk 1/2 mile  or more without pain, easier out of a chair, lift with good mechanics.    NEXT MD VISIT: NA  OBJECTIVE:  Note: Objective measures were completed at Evaluation unless otherwise noted.  DIAGNOSTIC FINDINGS: 2 view radiographs of pelvis and hips show mild degenerative change to  left hip, there is joint spacing noted superiorly. No fractures noted.  PATIENT SURVEYS:  PSFS: THE PATIENT SPECIFIC  FUNCTIONAL SCALE  Place score of 0-10 (0 = unable to perform activity and 10 = able to perform activity at the same level as before injury or problem)  Activity Date: 02/27/2024 03/17/2024   Walk the dog 5 5   2.  Move without pain 2 2   3.  Bend/stoop 5 5   4.  Perform leg exercises 3 3   Total Score 3.75 3.75     Total Score = Sum of activity scores/number of activities  Minimally Detectable Change: 3 points (for single activity); 2 points (for average score)  [employer] [facility] (nd). The Patient Specific Functional Scale . Retrieved from [email or web address]   COGNITION: Overall cognitive status: Within functional limits for tasks assessed     SENSATION: [name] notes left anterior thigh symptoms as distal as the knee   MUSCLE LENGTH: 03/17/2024 Hamstrings: Right 40 deg; Left 40 deg  03/06/2024 Hamstrings: Right 40 deg; Left 40 deg  02/27/2024 Hamstrings: Right 35 deg; Left 40 deg  POSTURE: rounded shoulders, forward head, and decreased lumbar lordosis   LOWER EXTREMITY ROM:  Passive ROM Left/Right 02/27/2024 Left/Right 03/06/2024 Left/Right 03/12/2024 Left/Right 03/17/2024  Hip flexion 80/100 90/NT 90/100 95/100  Hip extension      Hip abduction      Hip adduction      Hip internal rotation NT/10 -7/NT -6/11 -4/13  Hip external rotation NT/26 34/NT 33/34 37/40  Knee flexion      Knee extension      Ankle dorsiflexion      Ankle plantarflexion      Ankle inversion      Ankle eversion      Trunk extension 0     Hamstrings   35/35 35/35   (Blank rows = not tested)  LOWER EXTREMITY MMT:  MMT Left/Right 02/27/2024 Left 03/17/2024  Hip flexion 3/5 3+  Hip extension    Hip abduction 3/5 3/5  Hip adduction    Hip internal rotation    Hip external rotation    Knee flexion    Knee extension 4-/4+ 4-/5  Ankle dorsiflexion    Ankle plantarflexion    Ankle inversion    Ankle eversion     (Blank rows =  not tested)  GAIT: Distance walked: 100 feet Assistive device utilized: None Level of assistance: Complete Independence Comments: [name] has a noticeable limp when walking  ================================================================  TREATMENT DATE:  03/23/24 TherEx:  Recumbent bike: Level 4 x 6 minutes Piriformis stretch in supine: x 3 holding 30 sec Glutes stretch in supine: x 3 holding 30 sec TherActivities:  Double leg press: 75# 3 x 10  Single leg press: 37# 2 x 10 Left LE, 50# 2 x 10  Wall squats c red physioball x 10  TRX squat with low back stretch x 10 Self Care:  STM using tennis ball  Manual:  Percussion device to pt's left glutes and piriformis    03/17/2024 Single knee-to-chest stretch with opposite leg straight 4 x 20 seconds Supine figure 4 stretch 4 x 20 seconds Supine hamstrings stretch with other leg straight 4 x 20 seconds Knee to opposite shoulder with opposite leg straight 4 x 20 seconds Side lie clams with Black Thera-Band resistance 2 sets of 10 x 3 seconds lie right, lift left with slow eccentrics Yoga Bridge 10 x 5 seconds Standing lumbar extension AROM 10 x 3 seconds Seated straight leg raises 2 sets of 5 with slow eccentrics and emphasis on avoiding quadriceps lag  Neuromuscular re-education: Tandem balance 4 x 20 seconds each eyes open; head turning and eyes closed   03/13/2024 Single knee-to-chest stretch with opposite leg straight 4 x 20 seconds Supine figure 4 stretch 4 x 20 seconds Supine hamstrings stretch with other leg straight 4 x 20 seconds Knee to opposite shoulder with opposite leg straight 4 x 20 seconds Side lie clams with Black Thera-Band resistance 10 x 3 seconds each side with slow eccentrics Yoga Bridge 10 x 5 seconds Standing lumbar extension AROM 10 x 3 seconds Seated straight leg raises 2 sets  of 5 with slow eccentrics and emphasis on avoiding quadriceps lag Supine straight leg raises 2 sets of 5 for 3 seconds and slow eccentrics, focus on maintaining a straight knee  Neuromuscular re-education: Tandem balance 4 x 20 seconds each eyes open; head turning and eyes closed  Functional Activities: Step down and hike 10 x bilateral   03/12/2024 Single knee-to-chest stretch with opposite leg straight 4 x 20 seconds Supine figure 4 stretch 4 x 20 seconds Supine hamstrings stretch with other leg straight 4 x 20 seconds Knee to opposite shoulder with opposite leg straight 4 x 20 seconds Side lie clams with Green Thera-Band resistance 10 x 3 seconds each side with slow eccentrics Yoga Bridge 10 x 5 seconds Standing lumbar extension AROM 10 x 3 seconds Supine straight leg raises 2 sets of 5 for 3 seconds and slow eccentrics, focus on maintaining a straight knee  Neuromuscular re-education: Discussed tandem balance  02464: Briefly reviewed practical bed mobility (log roll); reviewed the importance of avoiding flexion and flexion with rotation; reviewed measurements today and changes to his home exercise program      PATIENT EDUCATION:  Education details: See above Person educated: Patient Education method: Explanation, Demonstration, Tactile cues, Verbal cues, and Handouts Education comprehension: verbalized understanding, returned demonstration, verbal cues required, tactile cues required, and needs further education  HOME EXERCISE PROGRAM: Access Code: [email or web address] URL: [email or web address] Date: 03/23/2024 Prepared by: Delon Lunger  Exercises - Single Knee to Chest Stretch  - 1 x daily - 7 x weekly - 1 sets - 5 reps - 20 seconds hold - Supine Figure 4 Piriformis Stretch  - 1 x daily - 7 x weekly - 1 sets - 5 reps - 20 seconds hold - Standing Lumbar Extension at Wall - Forearms  - 5 x daily - 7 x weekly -  1 sets - 5 reps - 3 seconds hold - Tandem Stance  - 2  x daily - 7 x weekly - 1 sets - 10 reps - 20 second hold - Supine Hamstring Stretch  - 1 x daily - 7 x weekly - 1 sets - 5 reps - 20 seconds hold - Supine Gluteus Stretch  - 1 x daily - 7 x weekly - 1 sets - 5 reps - 20 seconds hold - Yoga Bridge  - 2 x daily - 7 x weekly - 2 sets - 10 reps - 5 seconds hold - Clamshell with Resistance  - 2 x daily - 7 x weekly - 2 sets - 10 reps - 3 seconds hold - Small Range Straight Leg Raise  - 2 x daily - 7 x weekly - 2 sets - 5 reps - 3 seconds hold - Seated Straight Leg Raise   - 2 x daily - 7 x weekly - 2 sets - 5 reps - 2 seconds hold - Standing Hip Hiking  - 1 x daily - 7 x weekly - 3 sets - 10 reps - Wall Squat with Swiss Ball  - 1 x daily - 7 x weekly - 2 sets - 10 reps  ASSESSMENT:  CLINICAL IMPRESSION: Pt with good response today with STM and percussion device to his left glutes and piriformis. Pt instructed in self STM using tennis ball for home use. Pt's HEP was updated this visit and we discussed progression with PT exercises transitioning to a gym based program. Pt with reported sensation of left knee buckling and treatment today was focusing on LE strengthening. We also discussed possibility of DN and handout issued.     Patient is a 73 y.o. male who was seen today for physical therapy evaluation and treatment for M25.552 (ICD-10-CM) - Pain in left hip R10.32,G89.29 (ICD-10-CM) - Groin pain, chronic, left.  Trip notes some subjective progress since his left groin injection, although he still has weakness, difficulty getting out of the car and gait impairments.  Significant clinical findings include postural abnormalities, limited lumbar extension AROM, limited left hip active range of motion and left hip weakness, particularly with hip flexors and abductors.  Trip will also benefit from low back strength and body mechanics work to address all impairments noted during today's evaluation.  OBJECTIVE IMPAIRMENTS: Abnormal gait, decreased activity  tolerance, decreased endurance, decreased knowledge of condition, difficulty walking, decreased ROM, decreased strength, decreased safety awareness, impaired perceived functional ability, impaired flexibility, improper body mechanics, postural dysfunction, and pain.   ACTIVITY LIMITATIONS: carrying, lifting, bending, stairs, and locomotion level  PARTICIPATION LIMITATIONS: community activity  PERSONAL FACTORS: ADD, HTN, syncope, stage 1 renal disease, cardiac history are also affecting patient's functional outcome.    REHAB POTENTIAL: Good  CLINICAL DECISION MAKING: Evolving/moderate complexity  EVALUATION COMPLEXITY: Moderate   GOALS: Goals reviewed with patient? Yes  SHORT TERM GOALS: Target date: 03/26/2024 Trip will be independent with his day 1 home exercise program Baseline: Started 02/27/2024 Goal status: Partially Met 03/17/2024  2.  Lumbar extension AROM to at least 5 degrees Baseline: 0 degrees Goal status: Ongoing 03/17/2024  3.  Improve left hip active range of motion for flexion to at least 90 and hip external rotation to at least 25 degrees Baseline: 80 and unable to assume test postures respectively   Goal status: Met 03/12/2024  4.  Improve left lower extremity strength as assessed by MMT Baseline: See objective Goal status: Met 03/17/2024  LONG TERM GOALS:  Target date: 04/23/2024  Improve patient-specific functional score to at least 6 Baseline: 3.75 Goal status: Ongoing 03/17/2024  2.  Improve low back and left thigh pain to consistently 2/10 or better on the visual analog scale Baseline: 4/10 Goal status: Ongoing 03/17/2024  3.  Improve lumbar extension AROM to 10 degrees Baseline: 0 degrees Goal status: Ongoing 03/17/2024  4.  Improve left hip flexibility for hamstrings to at least 50 degrees and external rotation to at least 30 degrees Baseline: 40 and unable to assess respectively Goal status: Partially Met 03/17/2024  5.  Improve left hip  strength by at least 1 MMT grade for hip flexors and hip abductors Baseline: See objective Goal status: Ongoing 03/17/2024  6.  Trip will be independent with his long-term maintenance home exercise program at discharge Baseline: Started 02/27/2024 Goal status: Ongoing 03/17/2024   PLAN:  PT FREQUENCY: 1-2x/week  PT DURATION: 6 weeks  PLANNED INTERVENTIONS: 97750- Physical Performance Testing, 97110-Therapeutic exercises, 97530- Therapeutic activity, 97112- Neuromuscular re-education, 97535- Self Care, 02859- Manual therapy, [phone]- Gait training, [phone]- Traction (mechanical), [phone] (1-2 muscles), 20561 (3+ muscles)- Dry Needling, Patient/Family education, Stair training, Joint mobilization, Spinal mobilization, Cryotherapy, and Moist heat  PLAN FOR NEXT SESSION:   Avoid posterior lumbar tilting, spine flexion while working on low back, hip abductors and quadriceps strength. Pt issued DN handout, question if he wishes to try and pull in DN therapist if available for piriformis/glute med   Delon JONELLE Lunger, PT, MPT 03/23/2024, 3:37 PM  "

## 2024-03-23 NOTE — Patient Instructions (Addendum)

## 2024-04-01 ENCOUNTER — Encounter: Payer: Self-pay | Admitting: Rehabilitative and Restorative Service Providers"

## 2024-04-01 ENCOUNTER — Ambulatory Visit: Admitting: Rehabilitative and Restorative Service Providers"

## 2024-04-01 DIAGNOSIS — M6281 Muscle weakness (generalized): Secondary | ICD-10-CM

## 2024-04-01 DIAGNOSIS — M25652 Stiffness of left hip, not elsewhere classified: Secondary | ICD-10-CM | POA: Diagnosis not present

## 2024-04-01 DIAGNOSIS — R293 Abnormal posture: Secondary | ICD-10-CM | POA: Diagnosis not present

## 2024-04-01 DIAGNOSIS — R262 Difficulty in walking, not elsewhere classified: Secondary | ICD-10-CM

## 2024-04-01 NOTE — Therapy (Signed)
 " OUTPATIENT PHYSICAL THERAPY LOWER EXTREMITY TREATMENT NOTE  Patient Name: [name] MRN: [id number] DOB:17-Dec-1950, 74 y.o., male Today's Date: 04/01/2024  END OF SESSION:  PT End of Session - 04/01/24 1345     Visit Number 7    Number of Visits 16    Date for Recertification  04/23/24    Authorization Type MEDICARE    Progress Note Due on Visit 10    PT Start Time 1345    PT Stop Time 1430    PT Time Calculation (min) 45 min    Activity Tolerance Patient tolerated treatment well;No increased pain;Patient limited by fatigue    Behavior During Therapy [facility] for tasks assessed/performed                Past Medical History:  Diagnosis Date   Adult attention deficit disorder    Benign prostatic hypertrophy    Bradycardia    Diverticulitis of colon    colonoscopy 07/11/2000   HTN (hypertension)    Nephrolithiasis    prostatitis with noncaseating granulomatous inflammation July 2000 negative IPPD.     Syncope    Past Surgical History:  Procedure Laterality Date   CYSTOSTOMY W/ BLADDER BIOPSY     from trigone   TONSILLECTOMY  03/26/1956   TRANSURETHRAL RESECTION OF PROSTATE     US  ECHOCARDIOGRAPHY  07/06/2008   Patient Active Problem List   Diagnosis Date Noted   GERD (gastroesophageal reflux disease) 05/13/2023   Urinary retention due to benign prostatic hyperplasia 07/12/2021   Cluster headache syndrome 11/10/2019   Drug-induced gout 01/23/2019   Bilateral high frequency sensorineural hearing loss 02/25/2018   Tremor 11/08/2016   Epistaxis 05/14/2016   Chronic renal disease, stage I 04/19/2016   Syncope 04/27/2015   Acute URI 01/11/2015   Hyperlipidemia LDL goal <130 10/18/2014   Health care maintenance 04/10/2013   PVC's (premature ventricular contractions) 05/24/2010   PAROXYSMAL ATRIAL FIBRILLATION 03/17/2010   SYNCOPE 07/21/2008   Attention deficit disorder 07/24/2007   Essential hypertension 07/24/2007   Diverticulosis 07/24/2007   BPH  (benign prostatic hyperplasia) 07/24/2007    PCP: Tommy Freeman, MD  REFERRING PROVIDER: Gardiner Masters, MD  REFERRING DIAG: [phone] (ICD-10-CM) - Pain in left hip R10.32,G89.29 (ICD-10-CM) - Groin pain, chronic, left   THERAPY DIAG:  Stiffness of left hip, not elsewhere classified  Difficulty in walking, not elsewhere classified  Abnormal posture  Muscle weakness (generalized)  Rationale for Evaluation and Treatment: Rehabilitation  ONSET DATE: Summer of 2025 with moving  SUBJECTIVE:   SUBJECTIVE STATEMENT: Trip notes inconsistent HEP every couple of days participation.   PERTINENT HISTORY: ADD, HTN, syncope, stage 1 renal disease, cardiac history   PAIN:  Are you having pain? Yes: NPRS scale: 1-4/10 left thigh and low back Pain location: Thigh pain with movement/strain; Low back soreness Pain description: See above Aggravating factors: Getting out of a chair is a bit easier, walking, bending Relieving factors: Tylenol  1-2 x a day, thigh is better with rest  PRECAUTIONS: None  RED FLAGS: None   WEIGHT BEARING RESTRICTIONS: No  FALLS:  Has patient fallen in last 6 months? No  LIVING ENVIRONMENT: Lives with: lives with their family and lives with their spouse Lives in: House/apartment Stairs: Avoids when possible Has following equipment at home: wife has stuff for Parkinsons  OCCUPATION: Retired clinical research associate   PLOF: Independent  PATIENT GOALS: Walk 1/2 mile or more without pain, easier out of a chair, lift with good mechanics.    NEXT  MD VISIT: NA  OBJECTIVE:  Note: Objective measures were completed at Evaluation unless otherwise noted.  DIAGNOSTIC FINDINGS: 2 view radiographs of pelvis and hips show mild degenerative change to  left hip, there is joint spacing noted superiorly. No fractures noted.  PATIENT SURVEYS:  PSFS: THE PATIENT SPECIFIC FUNCTIONAL SCALE  Place score of 0-10 (0 = unable to perform activity and 10 = able to perform activity at  the same level as before injury or problem)  Activity Date: 02/27/2024 03/17/2024   Walk the dog 5 5   2.  Move without pain 2 2   3.  Bend/stoop 5 5   4.  Perform leg exercises 3 3   Total Score 3.75 3.75     Total Score = Sum of activity scores/number of activities  Minimally Detectable Change: 3 points (for single activity); 2 points (for average score)  [name] (nd). The Patient Specific Functional Scale . Retrieved from [email or web address]   COGNITION: Overall cognitive status: Within functional limits for tasks assessed     SENSATION: [name] notes left anterior thigh symptoms as distal as the knee   MUSCLE LENGTH: 03/17/2024 Hamstrings: Right 40 deg; Left 40 deg  03/06/2024 Hamstrings: Right 40 deg; Left 40 deg  02/27/2024 Hamstrings: Right 35 deg; Left 40 deg  POSTURE: rounded shoulders, forward head, and decreased lumbar lordosis   LOWER EXTREMITY ROM:  Passive ROM Left/Right 02/27/2024 Left/Right 03/06/2024 Left/Right 03/12/2024 Left/Right 03/17/2024  Hip flexion 80/100 90/NT 90/100 95/100  Hip extension      Hip abduction      Hip adduction      Hip internal rotation NT/10 -7/NT -6/11 -4/13  Hip external rotation NT/26 34/NT 33/34 37/40  Knee flexion      Knee extension      Ankle dorsiflexion      Ankle plantarflexion      Ankle inversion      Ankle eversion      Trunk extension 0     Hamstrings   35/35 35/35   (Blank rows = not tested)  LOWER EXTREMITY MMT:  MMT Left/Right 02/27/2024 Left 03/17/2024  Hip flexion 3/5 3+  Hip extension    Hip abduction 3/5 3/5  Hip adduction    Hip internal rotation    Hip external rotation    Knee flexion    Knee extension 4-/4+ 4-/5  Ankle dorsiflexion    Ankle plantarflexion    Ankle inversion    Ankle eversion     (Blank rows = not tested)  GAIT: Distance walked: 100 feet Assistive device utilized: None Level of assistance:  Complete Independence Comments: [name] has a noticeable limp when walking  ================================================================                                                                                                                              TREATMENT DATE:  04/01/2024 Single knee-to-chest stretch with opposite leg straight 2 x 20  seconds Supine figure 4 stretch 2 x 20 seconds Supine hamstrings stretch with other leg straight 2 x 20 seconds Knee to opposite shoulder with opposite leg straight 2 x 20 seconds Standing lumbar extension AROM 10 x 3 seconds Seated straight leg raises 3 sets of 10 with slow eccentrics and emphasis on avoiding quadriceps lag Prone alternating hip extension 2 sets of 10 x 3 seconds (forehead on forearms)  Neuromuscular re-education: Tandem balance 4 x 20 seconds each eyes open; head turning and eyes closed  Functional Activities: Step-down off 6 inch step 6 x each side Single leg Press 10 x Right with 75# and 5 x Left with 75#/50# (10 total), try 62#   03/23/24 TherEx:  Recumbent bike: Level 4 x 6 minutes Piriformis stretch in supine: x 3 holding 30 sec Glutes stretch in supine: x 3 holding 30 sec TherActivities:  Double leg press: 75# 3 x 10  Single leg press: 37# 2 x 10 Left LE, 50# 2 x 10  Wall squats c red physioball x 10  TRX squat with low back stretch x 10 Self Care:  STM using tennis ball  Manual:  Percussion device to pt's left glutes and piriformis   03/17/2024 Single knee-to-chest stretch with opposite leg straight 4 x 20 seconds Supine figure 4 stretch 4 x 20 seconds Supine hamstrings stretch with other leg straight 4 x 20 seconds Knee to opposite shoulder with opposite leg straight 4 x 20 seconds Side lie clams with Black Thera-Band resistance 2 sets of 10 x 3 seconds lie right, lift left with slow eccentrics Yoga Bridge 10 x 5 seconds Standing lumbar extension AROM 10 x 3 seconds Seated straight leg raises 2  sets of 5 with slow eccentrics and emphasis on avoiding quadriceps lag  Neuromuscular re-education: Tandem balance 4 x 20 seconds each eyes open; head turning and eyes closed    PATIENT EDUCATION:  Education details: See above Person educated: Patient Education method: Explanation, Demonstration, Tactile cues, Verbal cues, and Handouts Education comprehension: verbalized understanding, returned demonstration, verbal cues required, tactile cues required, and needs further education  HOME EXERCISE PROGRAM: Access Code: [email or web address] URL: [email or web address] Date: 04/01/2024 Prepared by: Lamar Ivory  Exercises - Single Knee to Chest Stretch  - 1 x daily - 7 x weekly - 1 sets - 5 reps - 20 seconds hold - Supine Figure 4 Piriformis Stretch  - 1 x daily - 7 x weekly - 1 sets - 5 reps - 20 seconds hold - Standing Lumbar Extension at Wall - Forearms  - 5 x daily - 7 x weekly - 1 sets - 5 reps - 3 seconds hold - Tandem Stance  - 2 x daily - 7 x weekly - 1 sets - 10 reps - 20 second hold - Supine Hamstring Stretch  - 1 x daily - 7 x weekly - 1 sets - 5 reps - 20 seconds hold - Supine Gluteus Stretch  - 1 x daily - 7 x weekly - 1 sets - 5 reps - 20 seconds hold - Yoga Bridge  - 2 x daily - 7 x weekly - 2 sets - 10 reps - 5 seconds hold - Clamshell with Resistance  - 2 x daily - 7 x weekly - 2 sets - 10 reps - 3 seconds hold - Small Range Straight Leg Raise  - 2 x daily - 7 x weekly - 2 sets - 5 reps - 3 seconds hold - Seated Straight Leg Raise   -  2 x daily - 7 x weekly - 2 sets - 5 reps - 2 seconds hold - Standing Hip Hiking  - 1 x daily - 7 x weekly - 3 sets - 10 reps - Wall Squat with Swiss Ball  - 1 x daily - 7 x weekly - 2 sets - 10 reps - Prone Hip Extension  - 1-2 x daily - 7 x weekly - 2 sets - 10 reps - 3 seconds hold  ASSESSMENT:  CLINICAL IMPRESSION: Trip knows his HEP participation needs to improve.  He did demonstrate improved quality with his quadriceps  strengthening activities today, showing he is getting stronger.  His leg strength is still quite low and we discussed that he will need to continue his physical therapy (with significantly improved HEP participation).  From a PT perspective, he needs to do more at home and work on his strength.  If there is something medical that needs to be addressed, he is following up with Megan Williams, NP soon.  With consistent HEP participation and continued PT, I anticipate he will progress.    Patient is a 74 y.o. male who was seen today for physical therapy evaluation and treatment for M25.552 (ICD-10-CM) - Pain in left hip R10.32,G89.29 (ICD-10-CM) - Groin pain, chronic, left.  Trip notes some subjective progress since his left groin injection, although he still has weakness, difficulty getting out of the car and gait impairments.  Significant clinical findings include postural abnormalities, limited lumbar extension AROM, limited left hip active range of motion and left hip weakness, particularly with hip flexors and abductors.  Trip will also benefit from low back strength and body mechanics work to address all impairments noted during today's evaluation.  OBJECTIVE IMPAIRMENTS: Abnormal gait, decreased activity tolerance, decreased endurance, decreased knowledge of condition, difficulty walking, decreased ROM, decreased strength, decreased safety awareness, impaired perceived functional ability, impaired flexibility, improper body mechanics, postural dysfunction, and pain.   ACTIVITY LIMITATIONS: carrying, lifting, bending, stairs, and locomotion level  PARTICIPATION LIMITATIONS: community activity  PERSONAL FACTORS: ADD, HTN, syncope, stage 1 renal disease, cardiac history are also affecting patient's functional outcome.    REHAB POTENTIAL: Good  CLINICAL DECISION MAKING: Evolving/moderate complexity  EVALUATION COMPLEXITY: Moderate   GOALS: Goals reviewed with patient? Yes  SHORT TERM GOALS:  Target date: 03/26/2024 Trip will be independent with his day 1 home exercise program Baseline: Started 02/27/2024 Goal status: Partially Met 03/17/2024  2.  Lumbar extension AROM to at least 5 degrees Baseline: 0 degrees Goal status: Ongoing 03/17/2024  3.  Improve left hip active range of motion for flexion to at least 90 and hip external rotation to at least 25 degrees Baseline: 80 and unable to assume test postures respectively   Goal status: Met 03/12/2024  4.  Improve left lower extremity strength as assessed by MMT Baseline: See objective Goal status: Met 03/17/2024  LONG TERM GOALS: Target date: 04/23/2024  Improve patient-specific functional score to at least 6 Baseline: 3.75 Goal status: Ongoing 03/17/2024  2.  Improve low back and left thigh pain to consistently 2/10 or better on the visual analog scale Baseline: 4/10 Goal status: Ongoing 04/01/2024  3.  Improve lumbar extension AROM to 10 degrees Baseline: 0 degrees Goal status: Ongoing 03/17/2024  4.  Improve left hip flexibility for hamstrings to at least 50 degrees and external rotation to at least 30 degrees Baseline: 40 and unable to assess respectively Goal status: Partially Met 03/17/2024  5.  Improve left hip strength by at  least 1 MMT grade for hip flexors and hip abductors Baseline: See objective Goal status: Ongoing 04/01/2024  6.  Trip will be independent with his long-term maintenance home exercise program at discharge Baseline: Started 02/27/2024 Goal status: Ongoing 04/01/2024   PLAN:  PT FREQUENCY: 1-2x/week  PT DURATION: 3-4 weeks  PLANNED INTERVENTIONS: 97750- Physical Performance Testing, 97110-Therapeutic exercises, 97530- Therapeutic activity, 97112- Neuromuscular re-education, 97535- Self Care, 02859- Manual therapy, [phone]- Gait training, [phone]- Traction (mechanical), [phone] (1-2 muscles), [id number] (3+ muscles)- Dry Needling, Patient/Family education, Stair training, Joint mobilization, Spinal  mobilization, Cryotherapy, and Moist heat  PLAN FOR NEXT SESSION:   Avoid posterior lumbar tilting, spine flexion while working on low back, hip abductors and quadriceps strength.  Encourage increased HEP participation.   Myer LELON Ivory, PT, MPT 04/01/2024, 4:34 PM  "

## 2024-04-03 ENCOUNTER — Encounter: Payer: Self-pay | Admitting: Rehabilitative and Restorative Service Providers"

## 2024-04-03 ENCOUNTER — Ambulatory Visit: Admitting: Rehabilitative and Restorative Service Providers"

## 2024-04-03 DIAGNOSIS — R293 Abnormal posture: Secondary | ICD-10-CM | POA: Diagnosis not present

## 2024-04-03 DIAGNOSIS — M25652 Stiffness of left hip, not elsewhere classified: Secondary | ICD-10-CM

## 2024-04-03 DIAGNOSIS — M6281 Muscle weakness (generalized): Secondary | ICD-10-CM | POA: Diagnosis not present

## 2024-04-03 DIAGNOSIS — R262 Difficulty in walking, not elsewhere classified: Secondary | ICD-10-CM

## 2024-04-03 NOTE — Therapy (Signed)
 " OUTPATIENT PHYSICAL THERAPY LOWER EXTREMITY TREATMENT NOTE  Patient Name: ARVEL OQUINN MRN: [id number] DOB:18-Dec-1950, 74 y.o., male Today's Date: 04/03/2024  END OF SESSION:  PT End of Session - 04/03/24 1350     Visit Number 8    Number of Visits 16    Date for Recertification  04/23/24    Authorization Type MEDICARE    Progress Note Due on Visit 10    PT Start Time 1349    PT Stop Time 1431    PT Time Calculation (min) 42 min    Activity Tolerance Patient tolerated treatment well;No increased pain;Patient limited by fatigue    Behavior During Therapy [facility] for tasks assessed/performed                 Past Medical History:  Diagnosis Date   Adult attention deficit disorder    Benign prostatic hypertrophy    Bradycardia    Diverticulitis of colon    colonoscopy 07/11/2000   HTN (hypertension)    Nephrolithiasis    prostatitis with noncaseating granulomatous inflammation July 2000 negative IPPD.     Syncope    Past Surgical History:  Procedure Laterality Date   CYSTOSTOMY W/ BLADDER BIOPSY     from trigone   TONSILLECTOMY  03/26/1956   TRANSURETHRAL RESECTION OF PROSTATE     US  ECHOCARDIOGRAPHY  07/06/2008   Patient Active Problem List   Diagnosis Date Noted   GERD (gastroesophageal reflux disease) 05/13/2023   Urinary retention due to benign prostatic hyperplasia 07/12/2021   Cluster headache syndrome 11/10/2019   Drug-induced gout 01/23/2019   Bilateral high frequency sensorineural hearing loss 02/25/2018   Tremor 11/08/2016   Epistaxis 05/14/2016   Chronic renal disease, stage I 04/19/2016   Syncope 04/27/2015   Acute URI 01/11/2015   Hyperlipidemia LDL goal <130 10/18/2014   Health care maintenance 04/10/2013   PVC's (premature ventricular contractions) 05/24/2010   PAROXYSMAL ATRIAL FIBRILLATION 03/17/2010   SYNCOPE 07/21/2008   Attention deficit disorder 07/24/2007   Essential hypertension 07/24/2007   Diverticulosis 07/24/2007   BPH  (benign prostatic hyperplasia) 07/24/2007    PCP: Glendia Freeman, MD  REFERRING PROVIDER: Gardiner Masters, MD  REFERRING DIAG: [phone] (ICD-10-CM) - Pain in left hip R10.32,G89.29 (ICD-10-CM) - Groin pain, chronic, left   THERAPY DIAG:  Stiffness of left hip, not elsewhere classified  Difficulty in walking, not elsewhere classified  Abnormal posture  Muscle weakness (generalized)  Rationale for Evaluation and Treatment: Rehabilitation  ONSET DATE: Summer of 2025 with moving  SUBJECTIVE:   SUBJECTIVE STATEMENT: Home exercise program participation continues to be inconsistent.  Trip notes soreness, but not any increases in pain since his last visit.  PERTINENT HISTORY: ADD, HTN, syncope, stage 1 renal disease, cardiac history   PAIN:  Are you having pain? Yes: NPRS scale: Remains 1-4/10 left thigh and low back Pain location: Thigh pain with movement/strain; Low back soreness Pain description: See above Aggravating factors: Getting out of a chair is a bit easier, walking, bending Relieving factors: Tylenol  1-2 x a day, thigh is better with rest  PRECAUTIONS: None  RED FLAGS: None   WEIGHT BEARING RESTRICTIONS: No  FALLS:  Has patient fallen in last 6 months? No  LIVING ENVIRONMENT: Lives with: lives with their family and lives with their spouse Lives in: House/apartment Stairs: Avoids when possible Has following equipment at home: wife has stuff for Parkinsons  OCCUPATION: Retired clinical research associate   PLOF: Independent  PATIENT GOALS: Walk 1/2 mile or more without  pain, easier out of a chair, lift with good mechanics.    NEXT MD VISIT: NA  OBJECTIVE:  Note: Objective measures were completed at Evaluation unless otherwise noted.  DIAGNOSTIC FINDINGS: 2 view radiographs of pelvis and hips show mild degenerative change to  left hip, there is joint spacing noted superiorly. No fractures noted.  PATIENT SURVEYS:  PSFS: THE PATIENT SPECIFIC FUNCTIONAL SCALE  Place  score of 0-10 (0 = unable to perform activity and 10 = able to perform activity at the same level as before injury or problem)  Activity Date: 02/27/2024 03/17/2024   Walk the dog 5 5   2.  Move without pain 2 2   3.  Bend/stoop 5 5   4.  Perform leg exercises 3 3   Total Score 3.75 3.75     Total Score = Sum of activity scores/number of activities  Minimally Detectable Change: 3 points (for single activity); 2 points (for average score)  [facility] (nd). The Patient Specific Functional Scale . Retrieved from [email or web address]   COGNITION: Overall cognitive status: Within functional limits for tasks assessed     SENSATION: [name] notes left anterior thigh symptoms as distal as the knee   MUSCLE LENGTH: 03/17/2024 Hamstrings: Right 40 deg; Left 40 deg  03/06/2024 Hamstrings: Right 40 deg; Left 40 deg  02/27/2024 Hamstrings: Right 35 deg; Left 40 deg  POSTURE: rounded shoulders, forward head, and decreased lumbar lordosis   LOWER EXTREMITY ROM:  Passive ROM Left/Right 02/27/2024 Left/Right 03/06/2024 Left/Right 03/12/2024 Left/Right 03/17/2024  Hip flexion 80/100 90/NT 90/100 95/100  Hip extension      Hip abduction      Hip adduction      Hip internal rotation NT/10 -7/NT -6/11 -4/13  Hip external rotation NT/26 34/NT 33/34 37/40  Knee flexion      Knee extension      Ankle dorsiflexion      Ankle plantarflexion      Ankle inversion      Ankle eversion      Trunk extension 0     Hamstrings   35/35 35/35   (Blank rows = not tested)  LOWER EXTREMITY MMT:  MMT Left/Right 02/27/2024 Left 03/17/2024 Left/Right 04/03/2024  Hip flexion 3/5 3+   Hip extension     Hip abduction 3/5 3/5   Hip adduction     Hip internal rotation     Hip external rotation     Knee flexion     Knee extension 4-/4+ 4-/5 53.7/66.6 in pounds assessed with hand-held dynamometer  Ankle dorsiflexion     Ankle plantarflexion      Ankle inversion     Ankle eversion      (Blank rows = not tested)  GAIT: Distance walked: 100 feet Assistive device utilized: None Level of assistance: Complete Independence Comments: [name] has a noticeable limp when walking  ================================================================  TREATMENT DATE:  04/03/2024 Single knee-to-chest stretch with opposite leg straight 4 x 20 seconds Supine figure 4 stretch 4 x 20 seconds Supine hamstrings stretch with other leg straight 4 x 20 seconds Knee to opposite shoulder with opposite leg straight 4 x 20 seconds Standing lumbar extension AROM 10 x 3 seconds Seated straight leg raises 2 sets of 10 with slow eccentrics and emphasis on avoiding quadriceps lag (add 1 pound at next visit) Prone alternating hip extension 2 sets of 10 x 5 seconds (forehead on forearms)  Neuromuscular re-education: Single leg stance 6 x 10 seconds Slow dynamic marching (single-leg balance and hip flexion)  Functional Activities: Single leg Press 10 x Right with 75# and 5 x Left with 62# 2 sets of 10    04/01/2024 Single knee-to-chest stretch with opposite leg straight 2 x 20 seconds Supine figure 4 stretch 2 x 20 seconds Supine hamstrings stretch with other leg straight 2 x 20 seconds Knee to opposite shoulder with opposite leg straight 2 x 20 seconds Standing lumbar extension AROM 10 x 3 seconds Seated straight leg raises 3 sets of 10 with slow eccentrics and emphasis on avoiding quadriceps lag Prone alternating hip extension 2 sets of 10 x 3 seconds (forehead on forearms)  Neuromuscular re-education: Tandem balance 4 x 20 seconds each eyes open; head turning and eyes closed  Functional Activities: Step-down off 6 inch step 6 x each side Single leg Press 10 x Right with 75# and 5 x Left with 75#/50# (10 total), try  62#   03/23/24 TherEx:  Recumbent bike: Level 4 x 6 minutes Piriformis stretch in supine: x 3 holding 30 sec Glutes stretch in supine: x 3 holding 30 sec TherActivities:  Double leg press: 75# 3 x 10  Single leg press: 37# 2 x 10 Left LE, 50# 2 x 10  Wall squats c red physioball x 10  TRX squat with low back stretch x 10 Self Care:  STM using tennis ball  Manual:  Percussion device to pt's left glutes and piriformis    PATIENT EDUCATION:  Education details: See above Person educated: Patient Education method: Explanation, Demonstration, Tactile cues, Verbal cues, and Handouts Education comprehension: verbalized understanding, returned demonstration, verbal cues required, tactile cues required, and needs further education  HOME EXERCISE PROGRAM: Access Code: [email or web address] URL: [email or web address] Date: 04/01/2024 Prepared by: Lamar Ivory  Exercises - Single Knee to Chest Stretch  - 1 x daily - 7 x weekly - 1 sets - 5 reps - 20 seconds hold - Supine Figure 4 Piriformis Stretch  - 1 x daily - 7 x weekly - 1 sets - 5 reps - 20 seconds hold - Standing Lumbar Extension at Wall - Forearms  - 5 x daily - 7 x weekly - 1 sets - 5 reps - 3 seconds hold - Tandem Stance  - 2 x daily - 7 x weekly - 1 sets - 10 reps - 20 second hold - Supine Hamstring Stretch  - 1 x daily - 7 x weekly - 1 sets - 5 reps - 20 seconds hold - Supine Gluteus Stretch  - 1 x daily - 7 x weekly - 1 sets - 5 reps - 20 seconds hold - Yoga Bridge  - 2 x daily - 7 x weekly - 2 sets - 10 reps - 5 seconds hold - Clamshell with Resistance  - 2 x daily - 7 x weekly - 2 sets - 10 reps - 3 seconds hold - Small Range  Straight Leg Raise  - 2 x daily - 7 x weekly - 2 sets - 5 reps - 3 seconds hold - Seated Straight Leg Raise   - 2 x daily - 7 x weekly - 2 sets - 5 reps - 2 seconds hold - Standing Hip Hiking  - 1 x daily - 7 x weekly - 3 sets - 10 reps - Wall Squat with Swiss Ball  - 1 x daily - 7 x weekly - 2  sets - 10 reps - Prone Hip Extension  - 1-2 x daily - 7 x weekly - 2 sets - 10 reps - 3 seconds hold  ASSESSMENT:  CLINICAL IMPRESSION: Trip will benefit from increasing his HEP participation.  He is showing progress with his strength with some activities and physical therapy and we got a baseline objective quadriceps strength assessment today.  His leg strength is still quite low and I recommended he continue his physical therapy (with significantly improved HEP participation).  From a PT perspective, he still needs to do more at home and work on his strength.  If there is something medical that needs to be addressed, he is following up with Megan Williams, NP next week.  With consistent HEP participation and continued PT, I anticipate he will progress.    Patient is a 74 y.o. male who was seen today for physical therapy evaluation and treatment for M25.552 (ICD-10-CM) - Pain in left hip R10.32,G89.29 (ICD-10-CM) - Groin pain, chronic, left.  Trip notes some subjective progress since his left groin injection, although he still has weakness, difficulty getting out of the car and gait impairments.  Significant clinical findings include postural abnormalities, limited lumbar extension AROM, limited left hip active range of motion and left hip weakness, particularly with hip flexors and abductors.  Trip will also benefit from low back strength and body mechanics work to address all impairments noted during today's evaluation.  OBJECTIVE IMPAIRMENTS: Abnormal gait, decreased activity tolerance, decreased endurance, decreased knowledge of condition, difficulty walking, decreased ROM, decreased strength, decreased safety awareness, impaired perceived functional ability, impaired flexibility, improper body mechanics, postural dysfunction, and pain.   ACTIVITY LIMITATIONS: carrying, lifting, bending, stairs, and locomotion level  PARTICIPATION LIMITATIONS: community activity  PERSONAL FACTORS: ADD, HTN,  syncope, stage 1 renal disease, cardiac history are also affecting patient's functional outcome.    REHAB POTENTIAL: Good  CLINICAL DECISION MAKING: Evolving/moderate complexity  EVALUATION COMPLEXITY: Moderate   GOALS: Goals reviewed with patient? Yes  SHORT TERM GOALS: Target date: 03/26/2024 Trip will be independent with his day 1 home exercise program Baseline: Started 02/27/2024 Goal status: Partially Met 03/17/2024  2.  Lumbar extension AROM to at least 5 degrees Baseline: 0 degrees Goal status: Ongoing 03/17/2024  3.  Improve left hip active range of motion for flexion to at least 90 and hip external rotation to at least 25 degrees Baseline: 80 and unable to assume test postures respectively   Goal status: Met 03/12/2024  4.  Improve left lower extremity strength as assessed by MMT Baseline: See objective Goal status: Met 03/17/2024  LONG TERM GOALS: Target date: 04/23/2024  Improve patient-specific functional score to at least 6 Baseline: 3.75 Goal status: Ongoing 03/17/2024  2.  Improve low back and left thigh pain to consistently 2/10 or better on the visual analog scale Baseline: 4/10 Goal status: Ongoing 04/01/2024  3.  Improve lumbar extension AROM to 10 degrees Baseline: 0 degrees Goal status: Ongoing 03/17/2024  4.  Improve left hip flexibility for  hamstrings to at least 50 degrees and external rotation to at least 30 degrees Baseline: 40 and unable to assess respectively Goal status: Partially Met 03/17/2024  5.  Improve left hip strength by at least 1 MMT grade for hip flexors and hip abductors Baseline: See objective Goal status: Ongoing 04/01/2024  6.  Trip will be independent with his long-term maintenance home exercise program at discharge Baseline: Started 02/27/2024 Goal status: Ongoing 04/01/2024   PLAN:  PT FREQUENCY: 1-2x/week  PT DURATION: 3-4 weeks  PLANNED INTERVENTIONS: 97750- Physical Performance Testing, 97110-Therapeutic exercises,  97530- Therapeutic activity, 97112- Neuromuscular re-education, 97535- Self Care, 02859- Manual therapy, [phone]- Gait training, [phone]- Traction (mechanical), [phone] (1-2 muscles), [phone] (3+ muscles)- Dry Needling, Patient/Family education, Stair training, Joint mobilization, Spinal mobilization, Cryotherapy, and Moist heat  PLAN FOR NEXT SESSION:   Avoid posterior lumbar tilting, spine flexion while working on low back, hip abductors and quadriceps strength.  Encourage increased HEP participation.   Myer LELON Ivory, PT, MPT 04/03/2024, 4:53 PM  "

## 2024-04-06 ENCOUNTER — Ambulatory Visit: Admitting: Physical Medicine and Rehabilitation

## 2024-04-06 ENCOUNTER — Encounter: Payer: Self-pay | Admitting: Physical Medicine and Rehabilitation

## 2024-04-06 DIAGNOSIS — M5416 Radiculopathy, lumbar region: Secondary | ICD-10-CM

## 2024-04-06 DIAGNOSIS — G8929 Other chronic pain: Secondary | ICD-10-CM | POA: Diagnosis not present

## 2024-04-06 DIAGNOSIS — M25552 Pain in left hip: Secondary | ICD-10-CM

## 2024-04-06 DIAGNOSIS — R1032 Left lower quadrant pain: Secondary | ICD-10-CM

## 2024-04-06 DIAGNOSIS — M5442 Lumbago with sciatica, left side: Secondary | ICD-10-CM

## 2024-04-06 NOTE — Progress Notes (Signed)
 "  [name] - 74 y.o. male MRN [id number]  Date of birth: [birth year]  Office Visit Note: Visit Date: 04/06/2024 PCP: Tommy Hamilton, MD Referred by: Tommy Hamilton, MD  Subjective: Chief Complaint  Patient presents with   Left Hip - Pain   HPI: [name] is a 74 y.o. male who comes in today for evaluation of chronic, worsening and severe bilateral lower back pain radiating to left groin and anterior thigh down to knee. Biggest source of pain today is left anterior thigh region. He is here today in follow up post formal physical therapy. He does not feel PT was significantly beneficial in alleviating his pain. His pain continues to become severe with activity, walking and getting in and out of car. He describes pain as sore and tight sensation, currently rates as 8 out of 10. Feels his left leg is going to give way. Recent left hip radiographs show mild degenerative changes, there is joint space loss more superiorly. No prior imaging of lumbar spine that I can see. He underwent left intra-articular hip injection in our office on 01/27/2024. He reports less than 50% relief of pain for 1 week. He continues to have pain post PT and also weakness with left hip flexion. Patient denies numbness and tingling. No recent trauma or falls.      Review of Systems  Musculoskeletal:  Positive for back pain and joint pain.  Neurological:  Positive for weakness. Negative for tingling.  All other systems reviewed and are negative.  Otherwise per HPI.  Assessment & Plan: Visit Diagnoses:    ICD-10-CM   1. Chronic bilateral low back pain with left-sided sciatica  G89.29    M54.42     2. Pain in left hip  M25.552     3. Groin pain, chronic, left  R10.32    G89.29     4. Lumbar radiculopathy  M54.16        Plan: Findings:  Chronic, worsening and severe bilateral lower back pain radiating to left groin and anterior thigh down to knee. Biggest pain generator to left anterior thigh  region. Patient continues to have severe pain despite good conservative therapies such as formal physical therapy, home exercise regimen, rest and use of medications. Patients clinical presentation and exam are complex, differentials include intrinsic left hip issue vs lumbar radiculopathy. He does have pain when walking up stairs, getting in and out of car which seems more hip related. However, left anterior thigh pain could be more radicular as this pain pattern fits with L3 distribution. We discussed treatment plan in detail today. Next step is to place order for left hip and lumbar spine MRI imaging. Depending on results we would consider referral to Dr. Vernetta vs lumbar epidural steroid injection. Patient has no questions at this time. There is weakness with left hip flexion on exam today. No foot drop noted. We will see him back for MRI review.     Meds & Orders: No orders of the defined types were placed in this encounter.  No orders of the defined types were placed in this encounter.   Follow-up: Return for Left hip and lumbar MRI review.   Procedures: No procedures performed      Clinical History: No specialty comments available.   He reports that he has never smoked. He has never used smokeless tobacco. No results for input(s): HGBA1C, LABURIC in the last 8760 hours.  Objective:  VS:  HT:    WT:  BMI:     BP:   HR: bpm  TEMP: ( )  RESP:  Physical Exam Vitals and nursing note reviewed.  HENT:     Head: Normocephalic and atraumatic.     Right Ear: External ear normal.     Left Ear: External ear normal.     Nose: Nose normal.     Mouth/Throat:     Mouth: Mucous membranes are moist.  Eyes:     Extraocular Movements: Extraocular movements intact.  Cardiovascular:     Rate and Rhythm: Normal rate.     Pulses: Normal pulses.  Pulmonary:     Effort: Pulmonary effort is normal.  Abdominal:     General: Abdomen is flat. There is no distension.  Musculoskeletal:         General: Tenderness present.     Cervical back: Normal range of motion.     Comments: Patient rises from seated position to standing without difficulty. Good lumbar range of motion. No pain noted with facet loading. 5/5 strength noted with right hip flexion, knee flexion/extension, ankle dorsiflexion/plantarflexion and EHL. 4/5 strength with left hip flexion. No clonus noted bilaterally. No pain upon palpation of greater trochanters. Pain noted with internal rotation of left hip. Sensation intact bilaterally. Negative slump test bilaterally. Ambulates without aid, gait steady.     Skin:    General: Skin is warm and dry.     Capillary Refill: Capillary refill takes less than 2 seconds.  Neurological:     Mental Status: He is alert and oriented to person, place, and time.     Motor: Weakness present.  Psychiatric:        Mood and Affect: Mood normal.        Behavior: Behavior normal.     Ortho Exam  Imaging: No results found.  Past Medical/Family/Surgical/Social History: Medications & Allergies reviewed per EMR, new medications updated. Patient Active Problem List   Diagnosis Date Noted   GERD (gastroesophageal reflux disease) 05/13/2023   Urinary retention due to benign prostatic hyperplasia 07/12/2021   Cluster headache syndrome 11/10/2019   Drug-induced gout 01/23/2019   Bilateral high frequency sensorineural hearing loss 02/25/2018   Tremor 11/08/2016   Epistaxis 05/14/2016   Chronic renal disease, stage I 04/19/2016   Syncope 04/27/2015   Acute URI 01/11/2015   Hyperlipidemia LDL goal <130 10/18/2014   Health care maintenance 04/10/2013   PVC's (premature ventricular contractions) 05/24/2010   PAROXYSMAL ATRIAL FIBRILLATION 03/17/2010   SYNCOPE 07/21/2008   Attention deficit disorder 07/24/2007   Essential hypertension 07/24/2007   Diverticulosis 07/24/2007   BPH (benign prostatic hyperplasia) 07/24/2007   Past Medical History:  Diagnosis Date   Adult attention  deficit disorder    Benign prostatic hypertrophy    Bradycardia    Diverticulitis of colon    colonoscopy 07/11/2000   HTN (hypertension)    Nephrolithiasis    prostatitis with noncaseating granulomatous inflammation July 2000 negative IPPD.     Syncope    Family History  Problem Relation Age of Onset   Heart disease Mother 7       IHD with CABG   Kidney failure Father    Hypertension Father    Lung cancer Sister    Ehlers-Danlos syndrome Child    Colon cancer Neg Hx    Esophageal cancer Neg Hx    Rectal cancer Neg Hx    Stomach cancer Neg Hx    Colon polyps Neg Hx    Past Surgical History:  Procedure  Laterality Date   CYSTOSTOMY W/ BLADDER BIOPSY     from trigone   TONSILLECTOMY  03/26/1956   TRANSURETHRAL RESECTION OF PROSTATE     US  ECHOCARDIOGRAPHY  07/06/2008   Social History   Occupational History   Occupation: Scientist, Research (medical): [employer]  Tobacco Use   Smoking status: Never   Smokeless tobacco: Never  Vaping Use   Vaping status: Never Used  Substance and Sexual Activity   Alcohol use: Yes    Alcohol/week: 3.0 standard drinks of alcohol    Types: 3 Glasses of wine per week   Drug use: No   Sexual activity: Not on file    "

## 2024-04-06 NOTE — Progress Notes (Signed)
 Pain Scale   Average Pain 3 Patient advising he has chronic left hip pain and PT didn't help with his pain        +Driver, -BT, -Dye Allergies.

## 2024-04-07 NOTE — Therapy (Incomplete)
 " OUTPATIENT PHYSICAL THERAPY LOWER EXTREMITY TREATMENT NOTE  Patient Name: [name] MRN: [id number] DOB:11/25/1950, 74 y.o., male Today's Date: 04/07/2024  END OF SESSION:           Past Medical History:  Diagnosis Date   Adult attention deficit disorder    Benign prostatic hypertrophy    Bradycardia    Diverticulitis of colon    colonoscopy 07/11/2000   HTN (hypertension)    Nephrolithiasis    prostatitis with noncaseating granulomatous inflammation July 2000 negative IPPD.     Syncope    Past Surgical History:  Procedure Laterality Date   CYSTOSTOMY W/ BLADDER BIOPSY     from trigone   TONSILLECTOMY  03/26/1956   TRANSURETHRAL RESECTION OF PROSTATE     US  ECHOCARDIOGRAPHY  07/06/2008   Patient Active Problem List   Diagnosis Date Noted   GERD (gastroesophageal reflux disease) 05/13/2023   Urinary retention due to benign prostatic hyperplasia 07/12/2021   Cluster headache syndrome 11/10/2019   Drug-induced gout 01/23/2019   Bilateral high frequency sensorineural hearing loss 02/25/2018   Tremor 11/08/2016   Epistaxis 05/14/2016   Chronic renal disease, stage I 04/19/2016   Syncope 04/27/2015   Acute URI 01/11/2015   Hyperlipidemia LDL goal <130 10/18/2014   Health care maintenance 04/10/2013   PVC's (premature ventricular contractions) 05/24/2010   PAROXYSMAL ATRIAL FIBRILLATION 03/17/2010   SYNCOPE 07/21/2008   Attention deficit disorder 07/24/2007   Essential hypertension 07/24/2007   Diverticulosis 07/24/2007   BPH (benign prostatic hyperplasia) 07/24/2007    PCP: Tommy Freeman, MD  REFERRING PROVIDER: Gardiner Masters, MD  REFERRING DIAG: [phone] (ICD-10-CM) - Pain in left hip R10.32,G89.29 (ICD-10-CM) - Groin pain, chronic, left   THERAPY DIAG:  No diagnosis found.  Rationale for Evaluation and Treatment: Rehabilitation  ONSET DATE: Summer of 2025 with moving  SUBJECTIVE:   SUBJECTIVE STATEMENT: ***Home exercise program  participation continues to be inconsistent.  Trip notes soreness, but not any increases in pain since his last visit.  PERTINENT HISTORY: ADD, HTN, syncope, stage 1 renal disease, cardiac history   PAIN:  ***Are you having pain? Yes: NPRS scale: Remains 1-4/10 left thigh and low back Pain location: Thigh pain with movement/strain; Low back soreness Pain description: See above Aggravating factors: Getting out of a chair is a bit easier, walking, bending Relieving factors: Tylenol  1-2 x a day, thigh is better with rest  PRECAUTIONS: None  RED FLAGS: None   WEIGHT BEARING RESTRICTIONS: No  FALLS:  Has patient fallen in last 6 months? No  LIVING ENVIRONMENT: Lives with: lives with their family and lives with their spouse Lives in: House/apartment Stairs: Avoids when possible Has following equipment at home: wife has stuff for Parkinsons  OCCUPATION: Retired clinical research associate   PLOF: Independent  PATIENT GOALS: Walk 1/2 mile or more without pain, easier out of a chair, lift with good mechanics.    NEXT MD VISIT: NA  OBJECTIVE:  Note: Objective measures were completed at Evaluation unless otherwise noted.  DIAGNOSTIC FINDINGS: 2 view radiographs of pelvis and hips show mild degenerative change to  left hip, there is joint spacing noted superiorly. No fractures noted.  PATIENT SURVEYS:  PSFS: THE PATIENT SPECIFIC FUNCTIONAL SCALE  Place score of 0-10 (0 = unable to perform activity and 10 = able to perform activity at the same level as before injury or problem)  Activity Date: 02/27/2024 03/17/2024   Walk the dog 5 5   2.  Move without pain 2 2  3.  Bend/stoop 5 5   4.  Perform leg exercises 3 3   Total Score 3.75 3.75     Total Score = Sum of activity scores/number of activities  Minimally Detectable Change: 3 points (for single activity); 2 points (for average score)  [name] (nd). The Patient Specific Functional Scale . Retrieved from  [email or web address]   COGNITION: Overall cognitive status: Within functional limits for tasks assessed     SENSATION: [name] notes left anterior thigh symptoms as distal as the knee   MUSCLE LENGTH: 03/17/2024 Hamstrings: Right 40 deg; Left 40 deg  03/06/2024 Hamstrings: Right 40 deg; Left 40 deg  02/27/2024 Hamstrings: Right 35 deg; Left 40 deg  POSTURE: rounded shoulders, forward head, and decreased lumbar lordosis   LOWER EXTREMITY ROM:  Passive ROM Left/Right 02/27/2024 Left/Right 03/06/2024 Left/Right 03/12/2024 Left/Right 03/17/2024  Hip flexion 80/100 90/NT 90/100 95/100  Hip extension      Hip abduction      Hip adduction      Hip internal rotation NT/10 -7/NT -6/11 -4/13  Hip external rotation NT/26 34/NT 33/34 37/40  Knee flexion      Knee extension      Ankle dorsiflexion      Ankle plantarflexion      Ankle inversion      Ankle eversion      Trunk extension 0     Hamstrings   35/35 35/35   (Blank rows = not tested)  LOWER EXTREMITY MMT:  MMT Left/Right 02/27/2024 Left 03/17/2024 Left/Right 04/03/2024  Hip flexion 3/5 3+   Hip extension     Hip abduction 3/5 3/5   Hip adduction     Hip internal rotation     Hip external rotation     Knee flexion     Knee extension 4-/4+ 4-/5 53.7/66.6 in pounds assessed with hand-held dynamometer  Ankle dorsiflexion     Ankle plantarflexion     Ankle inversion     Ankle eversion      (Blank rows = not tested)  GAIT: Distance walked: 100 feet Assistive device utilized: None Level of assistance: Complete Independence Comments: [name] has a noticeable limp when walking  ================================================================                                                                                                                              TREATMENT DATE:  04/08/24***        04/03/2024 Single knee-to-chest stretch with opposite leg straight  4 x 20 seconds Supine figure 4 stretch 4 x 20 seconds Supine hamstrings stretch with other leg straight 4 x 20 seconds Knee to opposite shoulder with opposite leg straight 4 x 20 seconds Standing lumbar extension AROM 10 x 3 seconds Seated straight leg raises 2 sets of 10 with slow eccentrics and emphasis on avoiding quadriceps lag (add 1 pound at next visit) Prone alternating hip extension 2 sets of 10 x 5 seconds (forehead  on forearms)  Neuromuscular re-education: Single leg stance 6 x 10 seconds Slow dynamic marching (single-leg balance and hip flexion)  Functional Activities: Single leg Press 10 x Right with 75# and 5 x Left with 62# 2 sets of 10    04/01/2024 Single knee-to-chest stretch with opposite leg straight 2 x 20 seconds Supine figure 4 stretch 2 x 20 seconds Supine hamstrings stretch with other leg straight 2 x 20 seconds Knee to opposite shoulder with opposite leg straight 2 x 20 seconds Standing lumbar extension AROM 10 x 3 seconds Seated straight leg raises 3 sets of 10 with slow eccentrics and emphasis on avoiding quadriceps lag Prone alternating hip extension 2 sets of 10 x 3 seconds (forehead on forearms)  Neuromuscular re-education: Tandem balance 4 x 20 seconds each eyes open; head turning and eyes closed  Functional Activities: Step-down off 6 inch step 6 x each side Single leg Press 10 x Right with 75# and 5 x Left with 75#/50# (10 total), try 62#   03/23/24 TherEx:  Recumbent bike: Level 4 x 6 minutes Piriformis stretch in supine: x 3 holding 30 sec Glutes stretch in supine: x 3 holding 30 sec TherActivities:  Double leg press: 75# 3 x 10  Single leg press: 37# 2 x 10 Left LE, 50# 2 x 10  Wall squats c red physioball x 10  TRX squat with low back stretch x 10 Self Care:  STM using tennis ball  Manual:  Percussion device to pt's left glutes and piriformis    PATIENT EDUCATION:  Education details: See above Person educated: Patient Education  method: Explanation, Demonstration, Tactile cues, Verbal cues, and Handouts Education comprehension: verbalized understanding, returned demonstration, verbal cues required, tactile cues required, and needs further education  HOME EXERCISE PROGRAM: Access Code: [email or web address] URL: [email or web address] Date: 04/01/2024 Prepared by: Lamar Ivory  Exercises - Single Knee to Chest Stretch  - 1 x daily - 7 x weekly - 1 sets - 5 reps - 20 seconds hold - Supine Figure 4 Piriformis Stretch  - 1 x daily - 7 x weekly - 1 sets - 5 reps - 20 seconds hold - Standing Lumbar Extension at Wall - Forearms  - 5 x daily - 7 x weekly - 1 sets - 5 reps - 3 seconds hold - Tandem Stance  - 2 x daily - 7 x weekly - 1 sets - 10 reps - 20 second hold - Supine Hamstring Stretch  - 1 x daily - 7 x weekly - 1 sets - 5 reps - 20 seconds hold - Supine Gluteus Stretch  - 1 x daily - 7 x weekly - 1 sets - 5 reps - 20 seconds hold - Yoga Bridge  - 2 x daily - 7 x weekly - 2 sets - 10 reps - 5 seconds hold - Clamshell with Resistance  - 2 x daily - 7 x weekly - 2 sets - 10 reps - 3 seconds hold - Small Range Straight Leg Raise  - 2 x daily - 7 x weekly - 2 sets - 5 reps - 3 seconds hold - Seated Straight Leg Raise   - 2 x daily - 7 x weekly - 2 sets - 5 reps - 2 seconds hold - Standing Hip Hiking  - 1 x daily - 7 x weekly - 3 sets - 10 reps - Wall Squat with Swiss Ball  - 1 x daily - 7 x weekly - 2 sets - 10 reps - Prone  Hip Extension  - 1-2 x daily - 7 x weekly - 2 sets - 10 reps - 3 seconds hold  ASSESSMENT:  CLINICAL IMPRESSION: ***Trip will benefit from increasing his HEP participation.  He is showing progress with his strength with some activities and physical therapy and we got a baseline objective quadriceps strength assessment today.  His leg strength is still quite low and I recommended he continue his physical therapy (with significantly improved HEP participation).  From a PT perspective, he still needs  to do more at home and work on his strength.  If there is something medical that needs to be addressed, he is following up with Megan Williams, NP next week.  With consistent HEP participation and continued PT, I anticipate he will progress.    Patient is a 74 y.o. male who was seen today for physical therapy evaluation and treatment for M25.552 (ICD-10-CM) - Pain in left hip R10.32,G89.29 (ICD-10-CM) - Groin pain, chronic, left.  Trip notes some subjective progress since his left groin injection, although he still has weakness, difficulty getting out of the car and gait impairments.  Significant clinical findings include postural abnormalities, limited lumbar extension AROM, limited left hip active range of motion and left hip weakness, particularly with hip flexors and abductors.  Trip will also benefit from low back strength and body mechanics work to address all impairments noted during today's evaluation.  OBJECTIVE IMPAIRMENTS: Abnormal gait, decreased activity tolerance, decreased endurance, decreased knowledge of condition, difficulty walking, decreased ROM, decreased strength, decreased safety awareness, impaired perceived functional ability, impaired flexibility, improper body mechanics, postural dysfunction, and pain.   ACTIVITY LIMITATIONS: carrying, lifting, bending, stairs, and locomotion level  PARTICIPATION LIMITATIONS: community activity  PERSONAL FACTORS: ADD, HTN, syncope, stage 1 renal disease, cardiac history are also affecting patient's functional outcome.    REHAB POTENTIAL: Good  CLINICAL DECISION MAKING: Evolving/moderate complexity  EVALUATION COMPLEXITY: Moderate   GOALS: Goals reviewed with patient? Yes  SHORT TERM GOALS: Target date: 03/26/2024 Trip will be independent with his day 1 home exercise program Baseline: Started 02/27/2024 Goal status: Partially Met 03/17/2024  2.  Lumbar extension AROM to at least 5 degrees Baseline: 0 degrees Goal status: Ongoing  03/17/2024  3.  Improve left hip active range of motion for flexion to at least 90 and hip external rotation to at least 25 degrees Baseline: 80 and unable to assume test postures respectively   Goal status: Met 03/12/2024  4.  Improve left lower extremity strength as assessed by MMT Baseline: See objective Goal status: Met 03/17/2024  LONG TERM GOALS: Target date: 04/23/2024  Improve patient-specific functional score to at least 6 Baseline: 3.75 Goal status: Ongoing 03/17/2024  2.  Improve low back and left thigh pain to consistently 2/10 or better on the visual analog scale Baseline: 4/10 Goal status: Ongoing 04/01/2024  3.  Improve lumbar extension AROM to 10 degrees Baseline: 0 degrees Goal status: Ongoing 03/17/2024  4.  Improve left hip flexibility for hamstrings to at least 50 degrees and external rotation to at least 30 degrees Baseline: 40 and unable to assess respectively Goal status: Partially Met 03/17/2024  5.  Improve left hip strength by at least 1 MMT grade for hip flexors and hip abductors Baseline: See objective Goal status: Ongoing 04/01/2024  6.  Trip will be independent with his long-term maintenance home exercise program at discharge Baseline: Started 02/27/2024 Goal status: Ongoing 04/01/2024   PLAN:  PT FREQUENCY: 1-2x/week  PT DURATION: 3-4 weeks  PLANNED  INTERVENTIONS: 97750- Physical Performance Testing, 97110-Therapeutic exercises, 97530- Therapeutic activity, [id number]- Neuromuscular re-education, 97535- Self Care, [id number]- Manual therapy, [phone]- Gait training, [phone]- Traction (mechanical), [phone] (1-2 muscles), [phone] (3+ muscles)- Dry Needling, Patient/Family education, Stair training, Joint mobilization, Spinal mobilization, Cryotherapy, and Moist heat  PLAN FOR NEXT SESSION:   ***Avoid posterior lumbar tilting, spine flexion while working on low back, hip abductors and quadriceps strength.  Encourage increased HEP participation.   Burnard CHRISTELLA Meth,  PT, 04/07/2024, 6:56 AM  "

## 2024-04-12 ENCOUNTER — Ambulatory Visit
Admission: RE | Admit: 2024-04-12 | Discharge: 2024-04-12 | Disposition: A | Source: Ambulatory Visit | Attending: Physical Medicine and Rehabilitation | Admitting: Physical Medicine and Rehabilitation

## 2024-04-12 DIAGNOSIS — G8929 Other chronic pain: Secondary | ICD-10-CM

## 2024-04-12 DIAGNOSIS — M5416 Radiculopathy, lumbar region: Secondary | ICD-10-CM

## 2024-04-12 DIAGNOSIS — M25552 Pain in left hip: Secondary | ICD-10-CM

## 2024-04-14 ENCOUNTER — Telehealth: Payer: Self-pay | Admitting: Physical Medicine and Rehabilitation

## 2024-04-14 ENCOUNTER — Encounter: Admitting: Physical Therapy

## 2024-04-14 NOTE — Telephone Encounter (Signed)
 Spoke with patient on Monday 04/13/2024 regarding recent left hip MRI imaging. There are significant concerning findings on MRI imaging including subchondral insufficiency fracture with secondary subchondral collapse and bone on bon contact against the adjacent superior acetabulum. There is extensive marrow edema involving the left femoral head. I did place referral to Dr. Vernetta for further evaluation. I am also waiting on lumbar MRI imaging to return, will discuss these results with him when we have radiology report.

## 2024-04-15 ENCOUNTER — Telehealth: Payer: Self-pay | Admitting: Physical Medicine and Rehabilitation

## 2024-04-15 NOTE — Telephone Encounter (Signed)
 I called [name] this morning to discuss recent lumbar MRI imaging. I do feel left hip is significantly worse compared to lumbar spine. There is moderate central canal stenosis at L3-L4 and L4-L5 that could cause pain. I think his best option is to talk with Dr. Vernetta regarding left hip, we will put lower back on hold for now. We could look at trying lumbar epidural steroid injection at some point.

## 2024-04-22 ENCOUNTER — Encounter: Admitting: Rehabilitative and Restorative Service Providers"

## 2024-04-27 ENCOUNTER — Encounter: Admitting: Orthopaedic Surgery

## 2024-05-18 ENCOUNTER — Encounter: Admitting: Orthopaedic Surgery
